# Patient Record
Sex: Male | Born: 1970 | Race: Black or African American | Hispanic: No | Marital: Married | Smoking: Never smoker
Health system: Southern US, Community
[De-identification: ages and names within clinical notes are randomized; demographics above are authoritative.]

## PROBLEM LIST (undated history)

## (undated) DIAGNOSIS — E119 Type 2 diabetes mellitus without complications: Secondary | ICD-10-CM

## (undated) DIAGNOSIS — IMO0002 Reserved for concepts with insufficient information to code with codable children: Secondary | ICD-10-CM

## (undated) DIAGNOSIS — I219 Acute myocardial infarction, unspecified: Secondary | ICD-10-CM

## (undated) DIAGNOSIS — I10 Essential (primary) hypertension: Secondary | ICD-10-CM

## (undated) DIAGNOSIS — J309 Allergic rhinitis, unspecified: Secondary | ICD-10-CM

## (undated) HISTORY — DX: Reserved for concepts with insufficient information to code with codable children: IMO0002

## (undated) HISTORY — PX: NO PAST SURGERIES: SHX2092

## (undated) HISTORY — DX: Allergic rhinitis, unspecified: J30.9

## (undated) HISTORY — PX: CORONARY ANGIOPLASTY: SHX604

---

## 2006-01-17 ENCOUNTER — Emergency Department: Payer: Self-pay | Admitting: Emergency Medicine

## 2006-01-26 ENCOUNTER — Emergency Department: Payer: Self-pay | Admitting: Emergency Medicine

## 2013-04-19 ENCOUNTER — Other Ambulatory Visit (INDEPENDENT_AMBULATORY_CARE_PROVIDER_SITE_OTHER): Payer: Self-pay | Admitting: Otolaryngology

## 2013-04-19 DIAGNOSIS — J329 Chronic sinusitis, unspecified: Secondary | ICD-10-CM

## 2013-04-23 ENCOUNTER — Other Ambulatory Visit: Payer: Self-pay

## 2014-12-04 ENCOUNTER — Other Ambulatory Visit (INDEPENDENT_AMBULATORY_CARE_PROVIDER_SITE_OTHER): Payer: Self-pay | Admitting: Otolaryngology

## 2014-12-04 DIAGNOSIS — J328 Other chronic sinusitis: Secondary | ICD-10-CM

## 2014-12-08 ENCOUNTER — Ambulatory Visit
Admission: RE | Admit: 2014-12-08 | Discharge: 2014-12-08 | Disposition: A | Discharge status: Home / Self Care | Payer: BC Managed Care – PPO | Source: Ambulatory Visit | Attending: Otolaryngology | Admitting: Otolaryngology

## 2014-12-08 ENCOUNTER — Other Ambulatory Visit: Payer: Self-pay

## 2014-12-08 DIAGNOSIS — J328 Other chronic sinusitis: Secondary | ICD-10-CM

## 2014-12-08 IMAGING — CT CT MAXILLOFACIAL W/O CM
2 series · 15 of 30 positions shown, 19 images · non-contrast
Comparison: None.

CLINICAL DATA: Chronic sinusitis and history of allergies. Fusion
protocol.

EXAM:
CT MAXILLOFACIAL WITHOUT CONTRAST
TECHNIQUE: Multidetector CT imaging of the maxillofacial structures was
performed. Multiplanar CT image reconstructions were also generated.
A small metallic BB was placed on the right temple in order to
reliably differentiate right from left.

[Series 3: axial soft 1.25 · axial · 0.49mm/px · z∈[-24,-9]mm · 2 of 88 slices shown]
[im 7/88  brain]
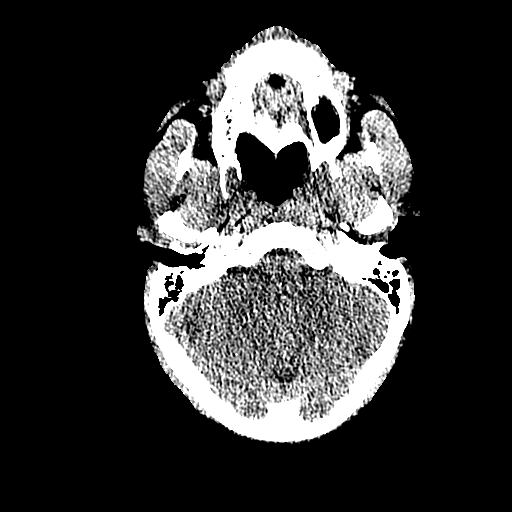
[im 19/88  brain]
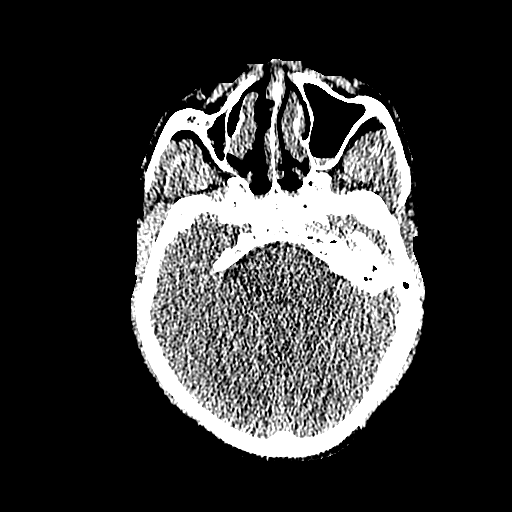

[Series 602: sagittal facial · sagittal · 0.49mm/px · 13 of 89 slices shown, 17 images]
[im 7/89  brain]
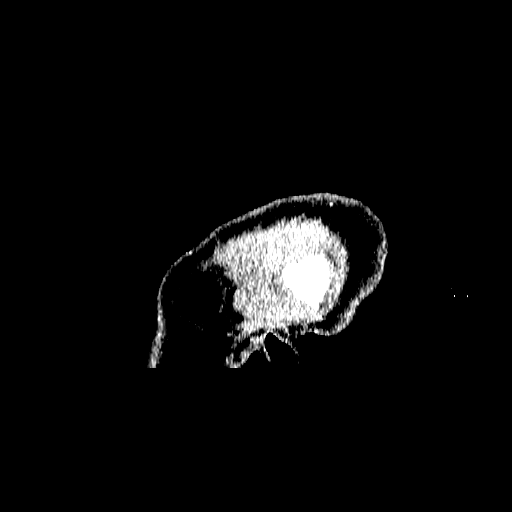
[im 7/89  bone]
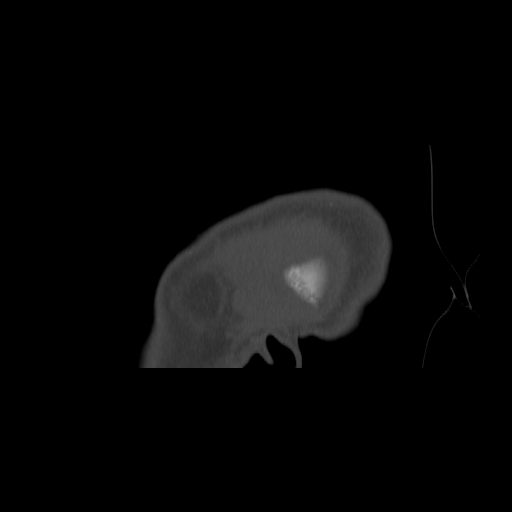
[im 13/89  bone]
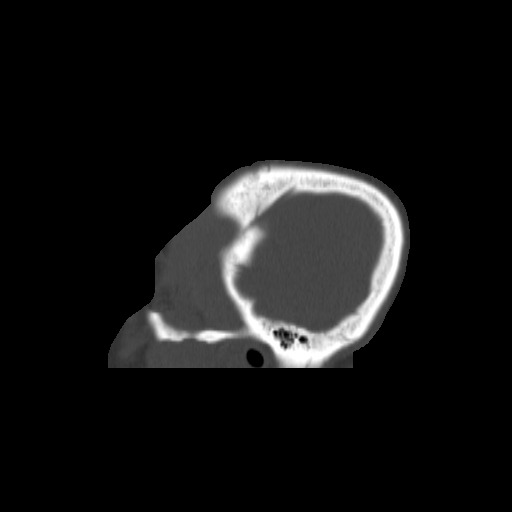
[im 19/89  bone]
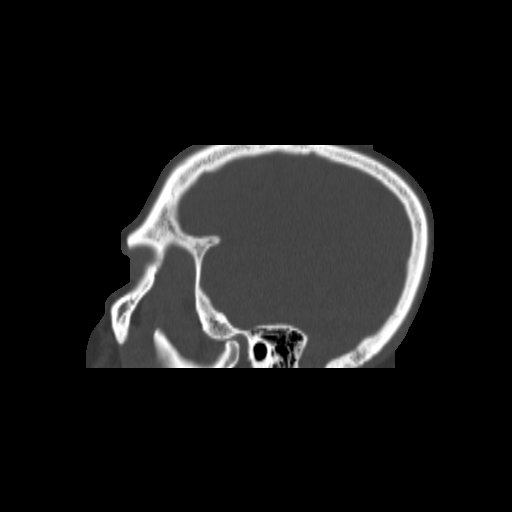
[im 26/89  bone]
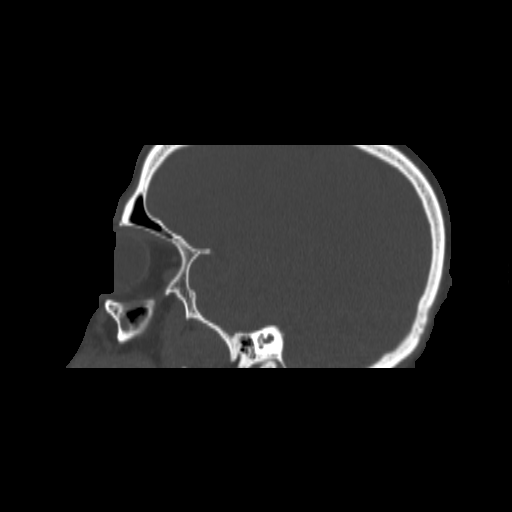
[im 32/89  brain]
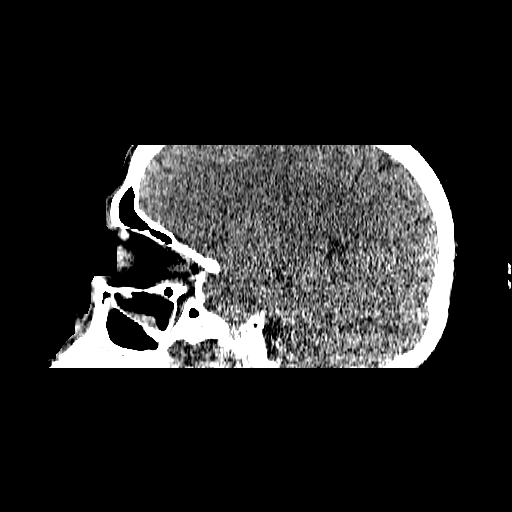
[im 32/89  bone]
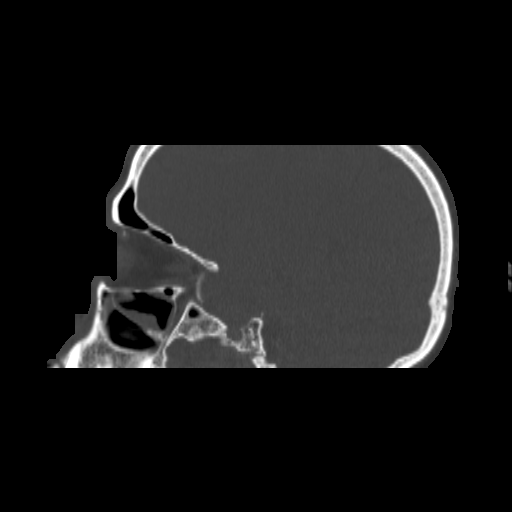
[im 38/89  bone]
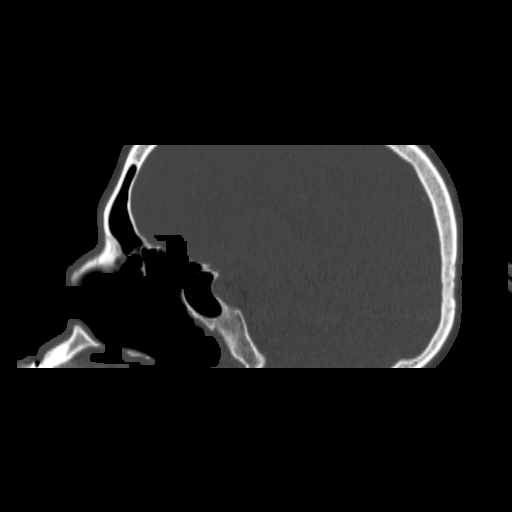
[im 45/89  bone]
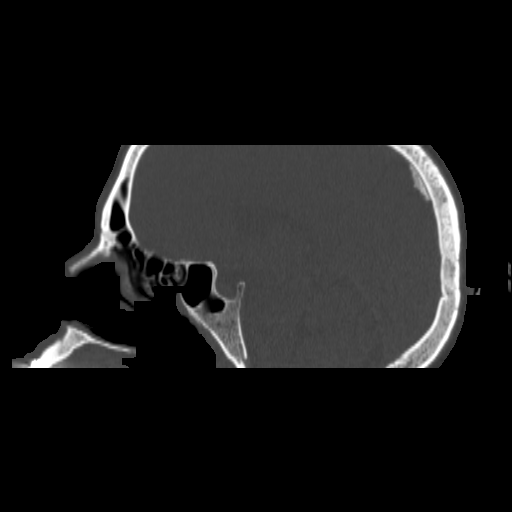
[im 51/89  bone]
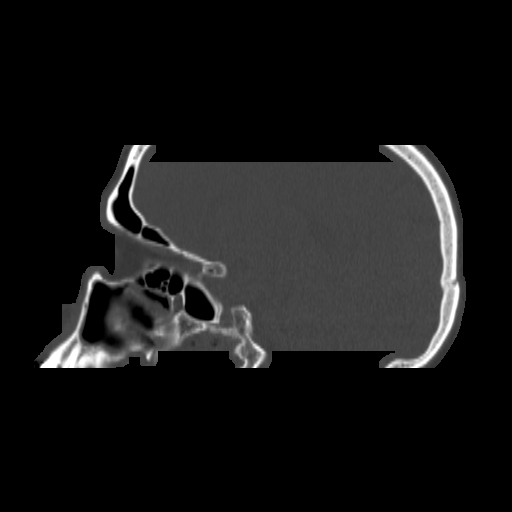
[im 57/89  brain]
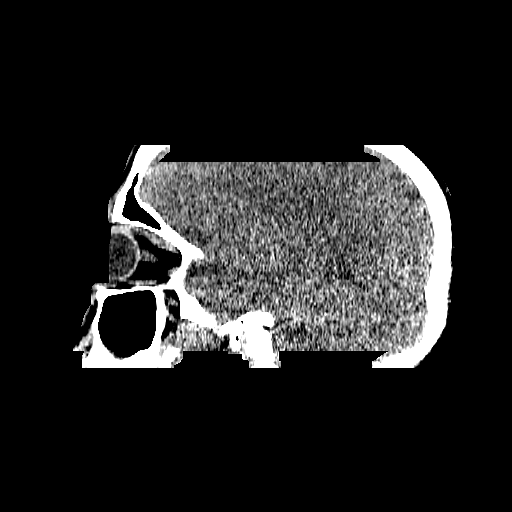
[im 57/89  bone]
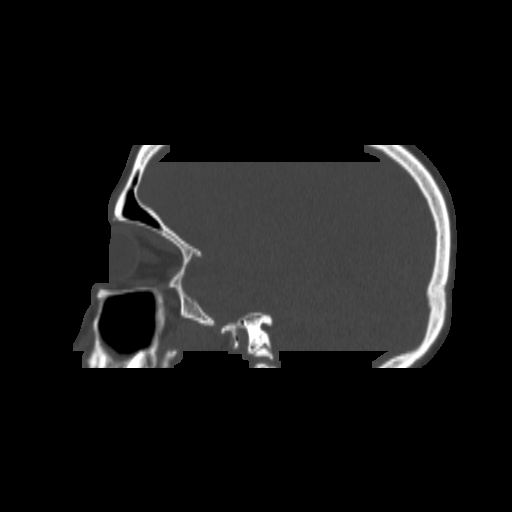
[im 63/89  bone]
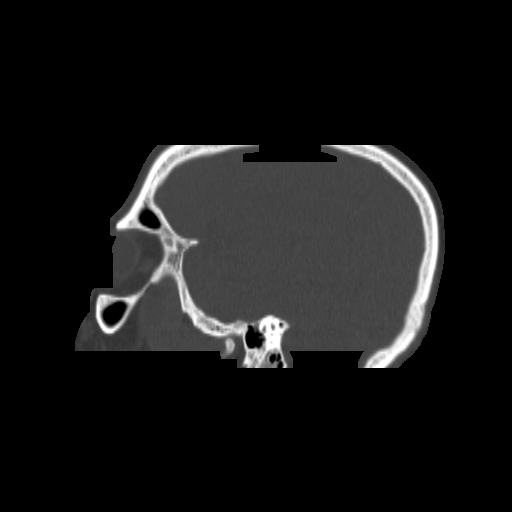
[im 70/89  bone]
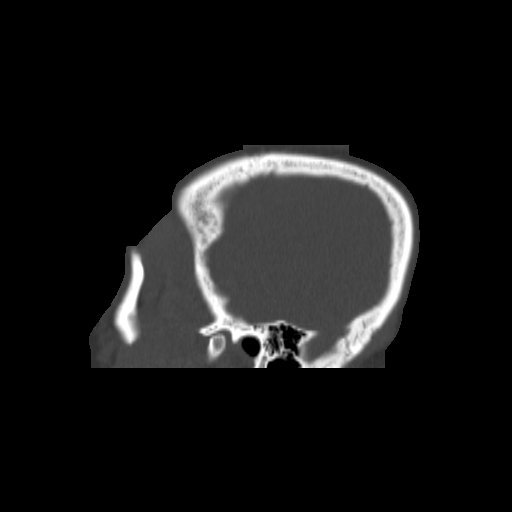
[im 76/89  bone]
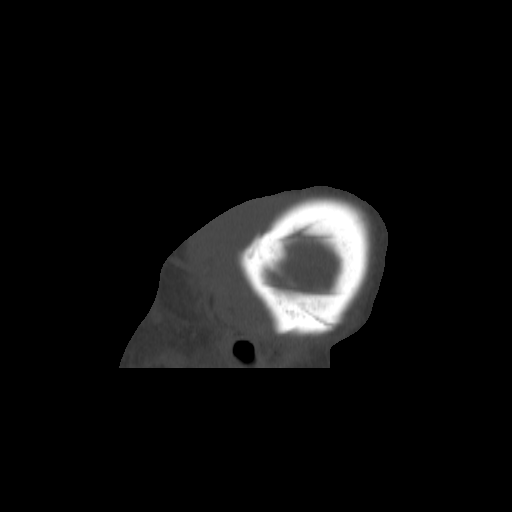
[im 82/89  brain]
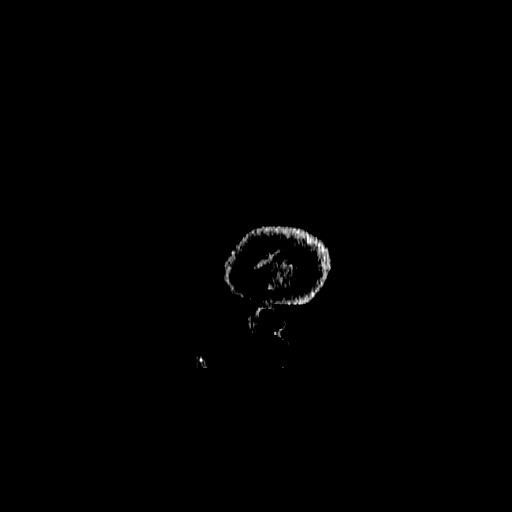
[im 82/89  bone]
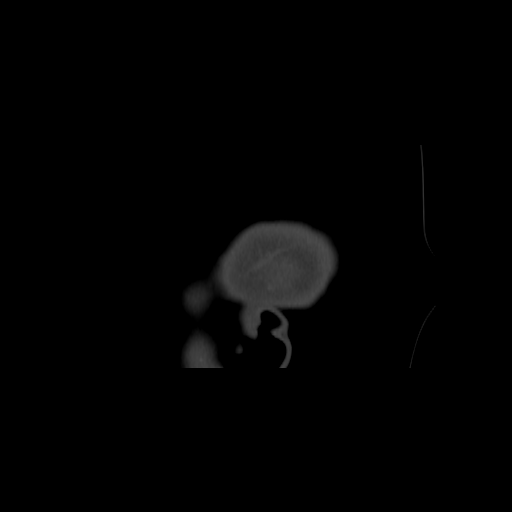

[15 of 30 positions shown; findings below may reference images not displayed]

FINDINGS: Small amount of layering fluid LEFT maxillary sinus. Severely
atelectatic RIGHT maxillary sinus with mucosal thickening,
sclerosis, and collapse inward of the medial wall. There may be
slight RIGHT enophthalmos and downward bowing of the orbital floor.

Minor mucosal thickening in the anterior ethmoid air cells without
air-fluid level. No frontal sinus fluid or sphenoid sinus fluid.

Infundibular narrowing on the LEFT related to mucosal thickening. No
similar infundibular narrowing on the RIGHT.

Boggy   inferior turbinates bilaterally.

Minor nasal septal deviation LEFT-to-RIGHT of 1-2 mm. Negative
intracranial compartment and mastoids. No nasopharyngeal or
extracranial soft tissue abnormality.
IMPRESSION: Severely atelectatic RIGHT maxillary sinus consistent with silent
sinus syndrome.

Layering fluid LEFT maxillary sinus.

Minor nasal septal deviation LEFT-to-RIGHT of 1-2 mm. Infundibular
narrowing on the LEFT related to mucosal thickening.

## 2016-03-14 ENCOUNTER — Encounter: Payer: Self-pay | Admitting: Urology

## 2016-03-14 ENCOUNTER — Ambulatory Visit (INDEPENDENT_AMBULATORY_CARE_PROVIDER_SITE_OTHER): Payer: BC Managed Care – PPO | Admitting: Urology

## 2016-03-14 VITALS — BP 114/74 | HR 83 | Ht 66.0 in | Wt 173.9 lb

## 2016-03-14 DIAGNOSIS — R350 Frequency of micturition: Secondary | ICD-10-CM

## 2016-03-14 DIAGNOSIS — N411 Chronic prostatitis: Secondary | ICD-10-CM

## 2016-03-14 DIAGNOSIS — J309 Allergic rhinitis, unspecified: Secondary | ICD-10-CM

## 2016-03-14 DIAGNOSIS — IMO0002 Reserved for concepts with insufficient information to code with codable children: Secondary | ICD-10-CM

## 2016-03-14 HISTORY — DX: Allergic rhinitis, unspecified: J30.9

## 2016-03-14 HISTORY — DX: Reserved for concepts with insufficient information to code with codable children: IMO0002

## 2016-03-14 LAB — URINALYSIS, COMPLETE
Bilirubin, UA: NEGATIVE
Glucose, UA: NEGATIVE
Ketones, UA: NEGATIVE
LEUKOCYTES UA: NEGATIVE
Nitrite, UA: NEGATIVE
PH UA: 7.5 (ref 5.0–7.5)
PROTEIN UA: NEGATIVE
RBC, UA: NEGATIVE
Specific Gravity, UA: 1.015 (ref 1.005–1.030)
Urobilinogen, Ur: 0.2 mg/dL (ref 0.2–1.0)

## 2016-03-14 LAB — BLADDER SCAN AMB NON-IMAGING: SCAN RESULT: 58

## 2016-03-14 LAB — MICROSCOPIC EXAMINATION
Bacteria, UA: NONE SEEN
EPITHELIAL CELLS (NON RENAL): NONE SEEN /HPF (ref 0–10)
WBC, UA: NONE SEEN /hpf (ref 0–?)

## 2016-03-14 MED ORDER — MELOXICAM 15 MG PO TABS: 15.0000 mg | ORAL_TABLET | Freq: Every day | ORAL | Status: DC

## 2016-03-14 MED ORDER — CIPROFLOXACIN HCL 500 MG PO TABS: 500.0000 mg | ORAL_TABLET | Freq: Two times a day (BID) | ORAL | Status: DC

## 2016-03-14 NOTE — Progress Notes (Signed)
03/14/2016 11:35 AM   Brent Oneill 08/30/1971 [id number]  Referring provider: No referring provider defined for this encounter.  Chief Complaint  Patient presents with  . New Patient (Initial Visit)    HPI: For a few months the patient has discomfort or burning after ejaculation with associated frequency. Otherwise he voids every 23 hours gets up once or twice a night. His flow is reasonable. I believe his flow can slow down after ejaculation as well.  He denies a history of previous GU surgery kidney stones urinary tract infections hematuria and blood with ejaculation  Bowel movements normal. No neurologic issues  Modifying factors: There are no other modifying factors  Associated signs and symptoms: There are no other associated signs and symptoms Aggravating and relieving factors: There are no other aggravating or relieving factors Severity: Moderate Duration: Persistent     PMH: Past Medical History  Diagnosis Date  . Asthma, persistent 03/14/2016  . Allergic rhinitis 03/14/2016    Surgical History: Past Surgical History  Procedure Laterality Date  . No past surgeries      Home Medications:    Medication List       This list is accurate as of: 03/14/16 11:35 AM.  Always use your most recent med list.               BREO ELLIPTA 200-25 MCG/INH Aepb  Generic drug:  fluticasone furoate-vilanterol  INHALE 1 PUFF ONCE A DAY. STOP SYMBICORT!     chlorthalidone 25 MG tablet  Commonly known as:  HYGROTON  USE AS DIRECTED TAKE HALF TAB DAILY     levocetirizine 5 MG tablet  Commonly known as:  XYZAL  Take 5 mg by mouth daily.     montelukast 10 MG tablet  Commonly known as:  SINGULAIR  Take 10 mg by mouth every morning.     PROAIR HFA 108 (90 Base) MCG/ACT inhaler  Generic drug:  albuterol  INHALE 1-2 PUFFS AS NEEDED EVERY 4-6 HOURS FOR COUGH OR WHEEZE. MUST LAST 30 DAYS PER MD        Allergies:  Allergies  Allergen Reactions  . Quinine Derivatives  Itching    Family History: Family History  Problem Relation Age of Onset  . Kidney cancer Neg Hx   . Prostate cancer Neg Hx     Social History:  reports that he has never smoked. He does not have any smokeless tobacco history on file. He reports that he drinks alcohol. He reports that he does not use illicit drugs.  ROS: UROLOGY Frequent Urination?: Yes Hard to postpone urination?: Yes Burning/pain with urination?: No Get up at night to urinate?: No Leakage of urine?: No Urine stream starts and stops?: Yes Trouble starting stream?: No Do you have to strain to urinate?: No Blood in urine?: No Urinary tract infection?: No Sexually transmitted disease?: No Injury to kidneys or bladder?: No Painful intercourse?: No Weak stream?: Yes Erection problems?: Yes Penile pain?: No  Gastrointestinal Nausea?: No Vomiting?: No Indigestion/heartburn?: No Diarrhea?: No Constipation?: No  Constitutional Fever: No Night sweats?: No Weight loss?: No Fatigue?: No  Skin Skin rash/lesions?: No Itching?: No  Eyes Blurred vision?: No Double vision?: No  Ears/Nose/Throat Sore throat?: No Sinus problems?: No  Hematologic/Lymphatic Swollen glands?: No Easy bruising?: No  Cardiovascular Leg swelling?: No Chest pain?: No  Respiratory Cough?: No Shortness of breath?: Yes  Endocrine Excessive thirst?: No  Musculoskeletal Back pain?: No Joint pain?: No  Neurological Headaches?: No Dizziness?: No  Psychologic  Depression?: No Anxiety?: No  Physical Exam: BP 114/74 mmHg  Pulse 83  Ht  (1.676 m)  Wt 173 lb 14.4 oz (78.881 kg)  BMI 28.08 kg/m2  Constitutional:  Alert and oriented, No acute distress. HEENT: [place] AT, moist mucus membranes.  Trachea midline, no masses. Cardiovascular: No clubbing, cyanosis, or edema. Respiratory: Normal respiratory effort, no increased work of breathing. GI: Abdomen is soft, nontender, nondistended, no abdominal masses GU: No  CVA tenderness. 40 g benign prostate Skin: No rashes, bruises or suspicious lesions. Lymph: No cervical or inguinal adenopathy. Neurologic: Grossly intact, no focal deficits, moving all 4 extremities. Psychiatric: Normal mood and affect.  Laboratory Data:  Urinalysis negative  Urinalysis No results found for: COLORURINE, APPEARANCEUR, LABSPEC, PHURINE, GLUCOSEU, HGBUR, BILIRUBINUR, KETONESUR, PROTEINUR, UROBILINOGEN, NITRITE, LEUKOCYTESUR  Pertinent Imaging: None  Assessment & Plan:  The patient has irritative voiding symptoms and perhaps flow symptoms and some discomfort especially after ejaculation. My working diagnosis is prostatitis. Pathophysiology discussed. Combination therapy discussed.  Mobile 15 mg 30 tablets and 1 refill sent. Ciprofloxacin 500 mg twice a day for 30 days sent. See in 6 weeks. Reassurance given. I believe he has mild associated erecti;e dysfunction that should normalize.  1. Urinary frequency 2. Prostatitis - Urinalysis, Complete - Bladder Scan (Post Void Residual) in office   Martina Sinner, MD  [facility] [address], [address] [place], [place] [address] [phone]

## 2016-04-27 ENCOUNTER — Ambulatory Visit (INDEPENDENT_AMBULATORY_CARE_PROVIDER_SITE_OTHER): Payer: BC Managed Care – PPO | Admitting: Urology

## 2016-04-27 ENCOUNTER — Encounter: Payer: Self-pay | Admitting: Urology

## 2016-04-27 VITALS — BP 130/89 | HR 84 | Ht 66.0 in | Wt 173.9 lb

## 2016-04-27 DIAGNOSIS — N411 Chronic prostatitis: Secondary | ICD-10-CM

## 2016-04-27 DIAGNOSIS — N410 Acute prostatitis: Secondary | ICD-10-CM | POA: Diagnosis not present

## 2016-04-27 DIAGNOSIS — R350 Frequency of micturition: Secondary | ICD-10-CM

## 2016-04-27 LAB — URINALYSIS, COMPLETE
Bilirubin, UA: NEGATIVE
Glucose, UA: NEGATIVE
KETONES UA: NEGATIVE
Leukocytes, UA: NEGATIVE
NITRITE UA: NEGATIVE
PH UA: 5.5 (ref 5.0–7.5)
Protein, UA: NEGATIVE
RBC, UA: NEGATIVE
Specific Gravity, UA: 1.015 (ref 1.005–1.030)
Urobilinogen, Ur: 1 mg/dL (ref 0.2–1.0)

## 2016-04-27 LAB — MICROSCOPIC EXAMINATION
BACTERIA UA: NONE SEEN
WBC, UA: NONE SEEN /hpf (ref 0–?)

## 2016-04-27 MED ORDER — MELOXICAM 15 MG PO TABS: 15.0000 mg | ORAL_TABLET | Freq: Every day | ORAL | 0 refills | Status: DC

## 2016-04-27 NOTE — Addendum Note (Signed)
Addended by: Lonna Cobb on: 04/27/2016 10:12 AM   Modules accepted: Orders

## 2016-04-27 NOTE — Progress Notes (Signed)
04/27/2016 8:46 AM   Brent Oneill [birth year] [id number]  Referring provider: No referring provider defined for this encounter.  Chief Complaint  Patient presents with  . Follow-up    urinary frequency, prostatitis     HPI: For a few months the patient has discomfort or burning after ejaculation with associated frequency. Otherwise he voids every 23 hours gets up once or twice a night. His flow is reasonable. I believe his flow can slow down after ejaculation as well.  Assessment & Plan:  The patient has irritative voiding symptoms and perhaps flow symptoms and some discomfort especially after ejaculation. My working diagnosis is prostatitis. Pathophysiology discussed. Combination therapy discussed.  Mobile 15 mg 30 tablets and 1 refill sent. Ciprofloxacin 500 mg twice a day for 30 days sent. See in 6 weeks. Reassurance given. I believe he has mild associated erecti;e dysfunction that should normalize.  The patient is improved but still has a little bit discomfort after ejaculation. For stable  Clinically no infections   PMH: Past Medical History:  Diagnosis Date  . Allergic rhinitis 03/14/2016  . Asthma, persistent 03/14/2016    Surgical History: Past Surgical History:  Procedure Laterality Date  . NO PAST SURGERIES      Home Medications:    Medication List       Accurate as of 04/27/16  8:46 AM. Always use your most recent med list.          BREO ELLIPTA 200-25 MCG/INH Aepb Generic drug:  fluticasone furoate-vilanterol INHALE 1 PUFF ONCE A DAY. STOP SYMBICORT!   chlorthalidone 25 MG tablet Commonly known as:  HYGROTON USE AS DIRECTED TAKE HALF TAB DAILY   levocetirizine 5 MG tablet Commonly known as:  XYZAL Take 5 mg by mouth daily.   meloxicam 15 MG tablet Commonly known as:  MOBIC Take 1 tablet (15 mg total) by mouth daily.   montelukast 10 MG tablet Commonly known as:  SINGULAIR Take 10 mg by mouth every morning.   PROAIR HFA 108 (90 Base)  MCG/ACT inhaler Generic drug:  albuterol INHALE 1-2 PUFFS AS NEEDED EVERY 4-6 HOURS FOR COUGH OR WHEEZE. MUST LAST 30 DAYS PER MD       Allergies:  Allergies  Allergen Reactions  . Quinine Derivatives Itching    Family History: Family History  Problem Relation Age of Onset  . Kidney cancer Neg Hx   . Prostate cancer Neg Hx     Social History:  reports that he has never smoked. He has never used smokeless tobacco. He reports that he drinks alcohol. He reports that he does not use drugs.  ROS:                                        Physical Exam: BP 130/89 (BP Location: Left Arm)   Pulse 84   Ht  (1.676 m)   Wt 173 lb 14.4 oz (78.9 kg)   BMI 28.07 kg/m     Laboratory Data: No results found for: WBC, HGB, HCT, MCV, PLT  No results found for: CREATININE  No results found for: PSA  No results found for: TESTOSTERONE  No results found for: HGBA1C  Urinalysis    Component Value Date/Time   APPEARANCEUR Clear 03/14/2016 1111   GLUCOSEU Negative 03/14/2016 1111   BILIRUBINUR Negative 03/14/2016 1111   PROTEINUR Negative 03/14/2016 1111   NITRITE Negative 03/14/2016 1111  LEUKOCYTESUR Negative 03/14/2016 1111    Pertinent Imaging: none  Assessment & Plan:  Resolving prostatitis; Mobile 15 mg a day for 30 days with no refills; see patient when necessary  1. Acute prostatitis  - Urinalysis, Complete  2. Urinary frequency  - Urinalysis, Complete   No Follow-up on file.  Martina Sinner, MD  [facility] [address] [place], [place] [address] [phone]

## 2016-08-02 ENCOUNTER — Telehealth: Payer: Self-pay | Admitting: Urology

## 2016-08-02 NOTE — Telephone Encounter (Signed)
Patient called the office this morning requesting a refill on Meloxicam 15 mg sent to his pharmacy ([facility] [place], [place]).

## 2016-08-09 NOTE — Telephone Encounter (Signed)
Pt called back in reference to refills of mobic. Made pt aware per Dr. Sherron MondayMacDiarmid dictation no refills were given. Pt then requested to make an appt. Pt was transferred to the front, but hung up before front desk could pick up. Made front desk aware of situation.

## 2016-10-10 ENCOUNTER — Encounter: Payer: Self-pay | Admitting: Urology

## 2016-10-10 ENCOUNTER — Ambulatory Visit (INDEPENDENT_AMBULATORY_CARE_PROVIDER_SITE_OTHER): Payer: BC Managed Care – PPO | Admitting: Urology

## 2016-10-10 VITALS — BP 117/80 | HR 84 | Ht 66.0 in | Wt 170.0 lb

## 2016-10-10 DIAGNOSIS — N529 Male erectile dysfunction, unspecified: Secondary | ICD-10-CM | POA: Diagnosis not present

## 2016-10-10 MED ORDER — SILDENAFIL CITRATE 100 MG PO TABS
100.0000 mg | ORAL_TABLET | ORAL | 5 refills | Status: DC | PRN
Start: 2016-10-10 — End: 2020-07-31

## 2016-10-10 NOTE — Progress Notes (Signed)
10/10/2016 4:36 PM   Brent Oneill 31-Mar-1971 [id number]  Referring provider: Sherron Monday, MD [address] [place], [place] [address]  Chief Complaint  Patient presents with  . Erectile Dysfunction    HPI: August 2017 For Oneill few months the patient has discomfort or burning after ejaculation with associated frequency. Otherwise he voids every 23 hours gets up once or twice One[place] night. His flow is reasonable. I believe his flow can slow down after ejaculation as well.  Assessment &Plan: The patient has irritative voiding symptoms and perhaps flow symptoms and some discomfort especially after ejaculation. My working diagnosis is prostatitis. Pathophysiology discussed. Combination therapy discussed.  Mobile 15 mg 30 tablets and 1 refill sent. Ciprofloxacin 500 mg twice One[place] day for 30 days sent. See in 6 weeks. Reassurance given. I believe he has mild associated erecti;e dysfunction that should normalize.    Today    PMH: Past Medical History:  Diagnosis Date  . Allergic rhinitis 03/14/2016  . Asthma, persistent 03/14/2016    Surgical History: Past Surgical History:  Procedure Laterality Date  . NO PAST SURGERIES      Home Medications:  Allergies as of 10/10/2016      Reactions   Quinine Derivatives Itching      Medication List       Accurate as of 10/10/16  4:36 PM. Always use your most recent med list.          BREO ELLIPTA 200-25 MCG/INH Aepb Generic drug:  fluticasone furoate-vilanterol INHALE 1 PUFF ONCE [place] DAY. STOP SYMBICORT!   chlorthalidone 25 MG tablet Commonly known as:  HYGROTON USE AS DIRECTED TAKE HALF TAB DAILY   levocetirizine 5 MG tablet Commonly known as:  XYZAL Take 5 mg by mouth daily.   montelukast 10 MG tablet Commonly known as:  SINGULAIR Take 10 mg by mouth every morning.   PROAIR HFA 108 (90 Base) MCG/ACT inhaler Generic drug:  albuterol INHALE 1-2 PUFFS AS NEEDED EVERY 4-6 HOURS FOR COUGH OR WHEEZE. MUST LAST 30 DAYS PER MD       Allergies:  Allergies  Allergen Reactions  . Quinine Derivatives Itching    Family History: Family History  Problem Relation Age of Onset  . Kidney cancer Neg Hx   . Prostate cancer Neg Hx     Social History:  reports that he has never smoked. He has never used smokeless tobacco. He reports that he drinks alcohol. He reports that he does not use drugs.  ROS: UROLOGY Frequent Urination?: No Hard to postpone urination?: Yes Burning/pain with urination?: No Get up at night to urinate?: No Leakage of urine?: No Urine stream starts and stops?: No Trouble starting stream?: No Do you have to strain to urinate?: No Blood in urine?: No Urinary tract infection?: No Sexually transmitted disease?: No Injury to kidneys or bladder?: No Painful intercourse?: No Weak stream?: Yes Erection problems?: Yes Penile pain?: Yes  Gastrointestinal Nausea?: No Vomiting?: No Indigestion/heartburn?: No Diarrhea?: No Constipation?: No  Constitutional Fever: No Night sweats?: No Weight loss?: No Fatigue?: No  Skin Skin rash/lesions?: No Itching?: No  Eyes Blurred vision?: No Double vision?: No  Ears/Nose/Throat Sore throat?: No Sinus problems?: No  Hematologic/Lymphatic Swollen glands?: No Easy bruising?: No  Cardiovascular Leg swelling?: No Chest pain?: No  Respiratory Cough?: No Shortness of breath?: No  Endocrine Excessive thirst?: No  Musculoskeletal Back pain?: No Joint pain?: No  Neurological Headaches?: No Dizziness?: No  Psychologic Depression?: No Anxiety?: No  Physical Exam:  BP 117/80   Pulse 84   Ht 5\' 6"  (1.676 m)   Wt 170 lb (77.1 kg)   BMI 27.44 kg/m     Laboratory Data: No results found for: WBC, HGB, HCT, MCV, PLT  No results found for: CREATININE  No results found for: PSA  No results found for: TESTOSTERONE  No results found for: HGBA1C  Urinalysis    Component Value Date/Time   APPEARANCEUR Clear 04/27/2016  0846   GLUCOSEU Negative 04/27/2016 0846   BILIRUBINUR Negative 04/27/2016 0846   PROTEINUR Negative 04/27/2016 0846   NITRITE Negative 04/27/2016 0846   LEUKOCYTESUR Negative 04/27/2016 0846    Pertinent Imaging: none  Assessment & Plan:  The patient said his prostatitis symptoms settled down dramatically and his erections may have improved somewhat to treat his prostatitis. He now notices One[place] little bit of slowing of his stream for One[place] few days after he has intercourse but otherwise does not have voiding complaints and tolerates it well  He still having Oneill hard time holding his erections and he is ejaculating early  We talked oh Viagra and I gave him Oneill 4 tablet prescription and Oneill co-pay card with refills. He can call for the 100 mg tablet if he wants Oneill larger prescription. We did not have samples. I will see him when necessary  There are no diagnoses linked to this encounter.  No Follow-up on file.  Martina SinnerMACDIARMID,Brent Conard A, MD  [facility] [address], [address] [place], [place] [address] [phone]

## 2017-03-30 ENCOUNTER — Telehealth: Payer: Self-pay | Admitting: Radiology

## 2017-03-30 NOTE — Telephone Encounter (Signed)
Pt requests refill of meloxicam. Pt states Dr Sherron MondayMacDiarmid prescribed it previously. Pt may be reached at [phone].

## 2017-03-31 NOTE — Telephone Encounter (Signed)
LMOM recommending pt call PCP for prescription as Dr Sherron MondayMacDiarmid only prescribed medication once with one refill & it was over 6 months ago.

## 2019-12-12 ENCOUNTER — Ambulatory Visit: Payer: BC Managed Care – PPO | Attending: Internal Medicine

## 2019-12-12 DIAGNOSIS — Z23 Encounter for immunization: Secondary | ICD-10-CM

## 2019-12-12 NOTE — Progress Notes (Signed)
   Covid-19 Vaccination Clinic  Name:  Brent Oneill    MRN: [id number] DOB: [birth year]  12/12/2019  [name] was observed post Covid-19 immunization for 15 minutes without incident. He was provided with Vaccine Information Sheet and instruction to access the V-Safe system.   [name] was instructed to call 911 with any severe reactions post vaccine: Marland Kitchen Difficulty breathing  . Swelling of face and throat  . A fast heartbeat  . A bad rash all over body  . Dizziness and weakness   Immunizations Administered    Name Date Dose VIS Date Route   Pfizer COVID-19 Vaccine 12/12/2019  2:46 PM 0.3 mL 09/06/2019 Intramuscular   Manufacturer: [employer], [employer]   Lot: [id number]   NDC: [address]-9

## 2020-01-06 ENCOUNTER — Ambulatory Visit: Payer: BC Managed Care – PPO | Attending: Internal Medicine

## 2020-01-06 DIAGNOSIS — Z23 Encounter for immunization: Secondary | ICD-10-CM

## 2020-01-06 NOTE — Progress Notes (Signed)
   Covid-19 Vaccination Clinic  Name:  Brent Oneill    MRN: [id number] DOB: 10/20/1970  01/06/2020  [name] was observed post Covid-19 immunization for 15 minutes without incident. He was provided with Vaccine Information Sheet and instruction to access the V-Safe system.   [name] was instructed to call 911 with any severe reactions post vaccine: Marland Kitchen Difficulty breathing  . Swelling of face and throat  . A fast heartbeat  . A bad rash all over body  . Dizziness and weakness   Immunizations Administered    Name Date Dose VIS Date Route   Pfizer COVID-19 Vaccine 01/06/2020  3:14 PM 0.3 mL 09/06/2019 Intramuscular   Manufacturer: [employer], [employer]   Lot: [id number]   NDC: [address]-0

## 2020-08-03 ENCOUNTER — Encounter: Payer: Self-pay | Admitting: Urology

## 2020-08-03 ENCOUNTER — Other Ambulatory Visit: Payer: Self-pay

## 2020-08-03 ENCOUNTER — Ambulatory Visit (INDEPENDENT_AMBULATORY_CARE_PROVIDER_SITE_OTHER): Payer: BC Managed Care – PPO | Admitting: Urology

## 2020-08-03 VITALS — BP 147/90 | HR 91 | Ht 66.0 in | Wt 172.0 lb

## 2020-08-03 DIAGNOSIS — N401 Enlarged prostate with lower urinary tract symptoms: Secondary | ICD-10-CM | POA: Diagnosis not present

## 2020-08-03 DIAGNOSIS — R972 Elevated prostate specific antigen [PSA]: Secondary | ICD-10-CM | POA: Diagnosis not present

## 2020-08-03 DIAGNOSIS — N138 Other obstructive and reflux uropathy: Secondary | ICD-10-CM | POA: Diagnosis not present

## 2020-08-03 NOTE — Patient Instructions (Addendum)
Prostate Cancer Screening  Prostate cancer screening is a test that is done to check for the presence of prostate cancer in men. The prostate gland is a walnut-sized gland that is located below the bladder and in front of the rectum in males. The function of the prostate is to add fluid to semen during ejaculation. Prostate cancer is the second most common type of cancer in men. Who should have prostate cancer screening?  Screening recommendations vary based on age and other risk factors. Screening is recommended if:  You are older than age 55. If you are age 55-69, talk with your health care provider about your need for screening and how often screening should be done. Because most prostate cancers are slow growing and will not cause death, screening is generally reserved in this age group for men who have a 10-15-year life expectancy.  You are younger than age 55, and you have these risk factors: ? Being a black male or a male of African descent. ? Having a father, brother, or uncle who has been diagnosed with prostate cancer. The risk is higher if your family member's cancer occurred at an early age. Screening is not recommended if:  You are younger than age 40.  You are between the ages of 40 and 54 and you have no risk factors.  You are 70 years of age or older. At this age, the risks that screening can cause are greater than the benefits that it may provide. If you are at high risk for prostate cancer, your health care provider may recommend that you have screenings more often or that you start screening at a younger age. How is screening for prostate cancer done? The recommended prostate cancer screening test is a blood test called the prostate-specific antigen (PSA) test. PSA is a protein that is made in the prostate. As you age, your prostate naturally produces more PSA. Abnormally high PSA levels may be caused by:  Prostate cancer.  An enlarged prostate that is not caused by cancer  (benign prostatic hyperplasia, BPH). This condition is very common in older men.  A prostate gland infection (prostatitis). Depending on the PSA results, you may need more tests, such as:  A physical exam to check the size of your prostate gland.  Blood and imaging tests.  A procedure to remove tissue samples from your prostate gland for testing (biopsy). What are the benefits of prostate cancer screening?  Screening can help to identify cancer at an early stage, before symptoms start and when the cancer can be treated more easily.  There is a small chance that screening may lower your risk of dying from prostate cancer. The chance is small because prostate cancer is a slow-growing cancer, and most men with prostate cancer die from a different cause. What are the risks of prostate cancer screening? The main risk of prostate cancer screening is diagnosing and treating prostate cancer that would never have caused any symptoms or problems. This is called overdiagnosisand overtreatment. PSA screening cannot tell you if your PSA is high due to cancer or a different cause. A prostate biopsy is the only procedure to diagnose prostate cancer. Even the results of a biopsy may not tell you if your cancer needs to be treated. Slow-growing prostate cancer may not need any treatment other than monitoring, so diagnosing and treating it may cause unnecessary stress or other side effects. A prostate biopsy may also cause:  Infection or fever.  A false negative. This is   a result that shows that you do not have prostate cancer when you actually do have prostate cancer. Questions to ask your health care provider  When should I start prostate cancer screening?  What is my risk for prostate cancer?  How often do I need screening?  What type of screening tests do I need?  How do I get my test results?  What do my results mean?  Do I need treatment? Where to find more information  The American Cancer  Society: www.cancer.org  American Urological Association: www.auanet.org Contact a health care provider if:  You have difficulty urinating.  You have pain when you urinate or ejaculate.  You have blood in your urine or semen.  You have pain in your back or in the area of your prostate. Summary  Prostate cancer is a common type of cancer in men. The prostate gland is located below the bladder and in front of the rectum. This gland adds fluid to semen during ejaculation.  Prostate cancer screening may identify cancer at an early stage, when the cancer can be treated more easily.  The prostate-specific antigen (PSA) test is the recommended screening test for prostate cancer.  Discuss the risks and benefits of prostate cancer screening with your health care provider. If you are age 38 or older, the risks that screening can cause are greater than the benefits that it may provide. This information is not intended to replace advice given to you by your health care provider. Make sure you discuss any questions you have with your health care provider. Document Revised: 04/25/2019 Document Reviewed: 04/25/2019 Elsevier Patient Education  [place].   Benign Prostatic Hyperplasia  Benign prostatic hyperplasia (BPH) is an enlarged prostate gland that is caused by the normal aging process and not by cancer. The prostate is a walnut-sized gland that is involved in the production of semen. It is located in front of the rectum and below the bladder. The bladder stores urine and the urethra is the tube that carries the urine out of the body. The prostate may get bigger as a man gets older. An enlarged prostate can press on the urethra. This can make it harder to pass urine. The build-up of urine in the bladder can cause infection. Back pressure and infection may progress to bladder damage and kidney (renal) failure. What are the causes? This condition is part of a normal aging process.  However, not all men develop problems from this condition. If the prostate enlarges away from the urethra, urine flow will not be blocked. If it enlarges toward the urethra and compresses it, there will be problems passing urine. What increases the risk? This condition is more likely to develop in men over the age of 97 years. What are the signs or symptoms? Symptoms of this condition include:  Getting up often during the night to urinate.  Needing to urinate frequently during the day.  Difficulty starting urine flow.  Decrease in size and strength of your urine stream.  Leaking (dribbling) after urinating.  Inability to pass urine. This needs immediate treatment.  Inability to completely empty your bladder.  Pain when you pass urine. This is more common if there is also an infection.  Urinary tract infection (UTI). How is this diagnosed? This condition is diagnosed based on your medical history, a physical exam, and your symptoms. Tests will also be done, such as:  A post-void bladder scan. This measures any amount of urine that may remain in your  bladder after you finish urinating.  A digital rectal exam. In a rectal exam, your health care provider checks your prostate by putting a lubricated, gloved finger into your rectum to feel the back of your prostate gland. This exam detects the size of your gland and any abnormal lumps or growths.  An exam of your urine (urinalysis).  A prostate specific antigen (PSA) screening. This is a blood test used to screen for prostate cancer.  An ultrasound. This test uses sound waves to electronically produce a picture of your prostate gland. Your health care provider may refer you to a specialist in kidney and prostate diseases (urologist). How is this treated? Once symptoms begin, your health care provider will monitor your condition (active surveillance or watchful waiting). Treatment for this condition will depend on the severity of your  condition. Treatment may include:  Observation and yearly exams. This may be the only treatment needed if your condition and symptoms are mild.  Medicines to relieve your symptoms, including: ? Medicines to shrink the prostate. ? Medicines to relax the muscle of the prostate.  Surgery in severe cases. Surgery may include: ? Prostatectomy. In this procedure, the prostate tissue is removed completely through an open incision or with a laparoscope or robotics. ? Transurethral resection of the prostate (TURP). In this procedure, a tool is inserted through the opening at the tip of the penis (urethra). It is used to cut away tissue of the inner core of the prostate. The pieces are removed through the same opening of the penis. This removes the blockage. ? Transurethral incision (TUIP). In this procedure, small cuts are made in the prostate. This lessens the prostate's pressure on the urethra. ? Transurethral microwave thermotherapy (TUMT). This procedure uses microwaves to create heat. The heat destroys and removes a small amount of prostate tissue. ? Transurethral needle ablation (TUNA). This procedure uses radio frequencies to destroy and remove a small amount of prostate tissue. ? Interstitial laser coagulation (ILC). This procedure uses a laser to destroy and remove a small amount of prostate tissue. ? Transurethral electrovaporization (TUVP). This procedure uses electrodes to destroy and remove a small amount of prostate tissue. ? Prostatic urethral lift. This procedure inserts an implant to push the lobes of the prostate away from the urethra. Follow these instructions at home:  Take over-the-counter and prescription medicines only as told by your health care provider.  Monitor your symptoms for any changes. Contact your health care provider with any changes.  Avoid drinking large amounts of liquid before going to bed or out in public.  Avoid or reduce how much caffeine or alcohol you  drink.  Give yourself time when you urinate.  Keep all follow-up visits as told by your health care provider. This is important. Contact a health care provider if:  You have unexplained back pain.  Your symptoms do not get better with treatment.  You develop side effects from the medicine you are taking.  Your urine becomes very dark or has a bad smell.  Your lower abdomen becomes distended and you have trouble passing your urine. Get help right away if:  You have a fever or chills.  You suddenly cannot urinate.  You feel lightheaded, or very dizzy, or you faint.  There are large amounts of blood or clots in the urine.  Your urinary problems become hard to manage.  You develop moderate to severe low back or flank pain. The flank is the side of your body between the ribs   and the hip. These symptoms may represent a serious problem that is an emergency. Do not wait to see if the symptoms will go away. Get medical help right away. Call your local emergency services (911 in the [place] Do not drive yourself to the hospital. Summary  Benign prostatic hyperplasia (BPH) is an enlarged prostate that is caused by the normal aging process and not by cancer.  An enlarged prostate can press on the urethra. This can make it hard to pass urine.  This condition is part of a normal aging process and is more likely to develop in men over the age of 50 years.  Get help right away if you suddenly cannot urinate. This information is not intended to replace advice given to you by your health care provider. Make sure you discuss any questions you have with your health care provider. Document Revised: 08/07/2018 Document Reviewed: 10/17/2016 Elsevier Patient Education  2020 [employer] Inc.   Transrectal Ultrasound-Guided Prostate Biopsy A transrectal ultrasound-guided prostate biopsy is a procedure to remove samples of prostate tissue for testing. The procedure uses ultrasound images to guide the  process of removing the samples. The samples are taken to a lab to be checked for prostate cancer. This procedure is usually done to evaluate the prostate gland of men who have raised (elevated) levels of prostate-specific antigen (PSA), which can be a sign of prostate cancer. Tell a health care provider about:  Any allergies you have.  All medicines you are taking, including vitamins, herbs, eye drops, creams, and over-the-counter medicines.  Any problems you or family members have had with anesthetic medicines.  Any blood disorders you have.  Any surgeries you have had.  Any medical conditions you have. What are the risks? Generally, this is a safe procedure. However, problems may occur, including:  Prostate infection.  Bleeding from the rectum.  Blood in the urine.  Allergic reactions to medicines.  Damage to surrounding structures such as blood vessels, organs, or muscles.  Difficulty passing urine.  Nerve damage. This is usually temporary.  Pain. What happens before the procedure? Staying hydrated Follow instructions from your health care provider about hydration, which may include:  Up to 2 hours before the procedure - you may continue to drink clear liquids, such as water, clear fruit juice, black coffee, and plain tea.  Eating and drinking restrictions Follow instructions from your health care provider about eating and drinking, which may include:  8 hours before the procedure - stop eating heavy meals or foods such as meat, fried foods, or fatty foods.  6 hours before the procedure - stop eating light meals or foods, such as toast or cereal.  6 hours before the procedure - stop drinking milk or drinks that contain milk.  2 hours before the procedure - stop drinking clear liquids. Medicines Ask your health care provider about:  Changing or stopping your regular medicines. This is especially important if you are taking diabetes medicines or blood  thinners.  Taking over-the-counter medicines, vitamins, herbs, and supplements.  Taking medicines such as aspirin and ibuprofen. These medicines can thin your blood. Do not take these medicines unless your health care provider tells you to take them. General instructions  You may be given antibiotic medicine to help prevent infection. If so, take the antibiotic as told by your health care provider.  You will be given an enema. During an enema, a liquid is injected into your rectum to clear out waste.  You may have a blood  or urine sample taken.  Plan to have someone take you home from the hospital or clinic. What happens during the procedure?   To lower your risk of infection: ? Your health care team will wash or sanitize their hands. ? Hair may be removed from the surgical area. ? Your skin will be cleaned with a germ-killing soap. ? You may be given antibiotic medicine.  An IV will be inserted into one of your veins.  You will be given one or both of the following: ? A medicine to help you relax (sedative). ? A medicine to numb the area (local anesthetic).  You will be placed on your left side, and your knees will be bent toward your chest.  A probe with lubricated gel will be placed into your rectum, and images will be taken of your prostate and surrounding structures.  Numbing medicine will be injected into your prostate.  A biopsy needle will be inserted through your rectum and guided to your prostate using the ultrasound images.  Prostate tissue samples will be removed, and the needle will then be removed.  The biopsy samples will be sent to a lab to be tested. The procedure may vary among health care providers and hospitals. What happens after the procedure?  Your blood pressure, heart rate, breathing rate, and blood oxygen level will be monitored until the medicines you were given have worn off.  You may have some discomfort in the rectal area. You will be given  pain medicine as needed.  Do not drive for 24 hours if you received a sedative. Summary  A transrectal ultrasound-guided biopsy removes samples of tissue from your prostate.  This procedure is usually done to evaluate the prostate gland of men who have raised (elevated) levels of prostate-specific antigen (PSA), which can be a sign of prostate cancer.  After your procedure, you may feel some discomfort in the rectal area.  Plan to have someone take you home from the hospital or clinic. This information is not intended to replace advice given to you by your health care provider. Make sure you discuss any questions you have with your health care provider. Document Revised: 01/02/2019 Document Reviewed: 12/09/2016 Elsevier Patient Education  2020 [employer].

## 2020-08-03 NOTE — Progress Notes (Signed)
08/03/20 4:28 PM   Meryle Ready Silverio Lay September 12, 1971 [id number]  CC: Elevated PSA, urinary symptoms  HPI: I saw Mr. Niu for elevated PSA and urinary symptoms.  He is a 49 year old healthy African-American male with a distant history of prostatitis who recently presented to his PCP with some weak urinary stream and urinary symptoms and was given a 10-day course of Bactrim as well as Flomax.  PSA at that time was elevated at 7.7.  A PSA was repeated 3 weeks later and had dropped down to 5.9.  He was referred for further evaluation of the persistently elevated PSA.  His primary urinary symptoms or weak stream which have resolved completely after the antibiotics and addition of Flomax.  He denies any family history of prostate or breast cancer.  He reports his dad has had a long problem with BPH and urinary problems requiring catheter previously.   PMH: Past Medical History:  Diagnosis Date   Allergic rhinitis 03/14/2016   Asthma, persistent 03/14/2016    Family History: Family History  Problem Relation Age of Onset   Kidney cancer Neg Hx    Prostate cancer Neg Hx     Social History:  reports that he has never smoked. He has never used smokeless tobacco. He reports current alcohol use. He reports that he does not use drugs.  Physical Exam: BP (!) 147/90 (BP Location: Left Arm, Patient Position: Sitting, Cuff Size: Normal)    Pulse 91    Ht 5\' 6"  (1.676 m)    Wt 172 lb (78 kg)    BMI 27.76 kg/m    Constitutional:  Alert and oriented, No acute distress. Cardiovascular: No clubbing, cyanosis, or edema. Respiratory: Normal respiratory effort, no increased work of breathing. GI: Abdomen is soft, nontender, nondistended, no abdominal masses GU: No testicular masses, widely patent meatus DRE: Enlarged prostate, 80 g, smooth, no nodules or masses  Laboratory Data: Reviewed, see HPI  Pertinent Imaging: None to review  Assessment & Plan:   Healthy 49 year old male who presented to his PCP  with a possible episode of prostatitis and PSA was found to be elevated at 7.7 and he was treated with tamsulosin and Bactrim.  PSA was repeated 3 weeks later and dropped down to 5.9.  His urinary symptoms have completely resolved after the course of antibiotics and starting Flomax.  DRE today shows an enlarged prostate but otherwise benign.  We reviewed the implications of an elevated PSA and the uncertainty surrounding it. In general, a man's PSA increases with age and is produced by both normal and cancerous prostate tissue. The differential diagnosis for elevated PSA includes BPH, prostate cancer, infection, recent intercourse/ejaculation, recent urethroscopic manipulation (foley placement/cystoscopy) or trauma, and prostatitis.   Management of an elevated PSA can include observation or prostate biopsy and we discussed this in detail. Our goal is to detect clinically significant prostate cancers, and manage with either active surveillance, surgery, or radiation for localized disease. Risks of prostate biopsy include bleeding, infection (including life threatening sepsis), pain, and lower urinary symptoms. Hematuria, hematospermia, and blood in the stool are all common after biopsy and can persist up to 4 weeks.   We reviewed at length that I suspect his PSA was elevated secondary to an episode of acute prostatitis.  I recommended repeating the PSA in 2 to 3 weeks to confirm downtrend to normal range.  If PSA remains elevated, would recommend prostate biopsy at that time to rule out malignancy.  We also briefly discussed other options for  his BPH and urinary symptoms including UroLift or HOLEP, versus ongoing medical management with Flomax.  RTC 3 to 4 weeks with PSA prior, IPSS, PVR   Legrand Rams, MD 08/03/2020  [facility] [address], [address] [place], [place] [address] [phone]

## 2020-08-28 ENCOUNTER — Other Ambulatory Visit: Payer: Self-pay

## 2020-09-02 ENCOUNTER — Ambulatory Visit: Payer: Self-pay | Admitting: Urology

## 2020-10-01 ENCOUNTER — Other Ambulatory Visit: Payer: Self-pay

## 2020-10-08 ENCOUNTER — Ambulatory Visit: Payer: Self-pay | Admitting: Urology

## 2020-11-19 ENCOUNTER — Other Ambulatory Visit: Payer: Self-pay

## 2020-11-19 ENCOUNTER — Other Ambulatory Visit: Payer: BC Managed Care – PPO

## 2020-11-19 DIAGNOSIS — R972 Elevated prostate specific antigen [PSA]: Secondary | ICD-10-CM

## 2020-11-20 LAB — FPSA% REFLEX
% FREE PSA: 25 %
PSA, FREE: 1 ng/mL

## 2020-11-20 LAB — PSA TOTAL (REFLEX TO FREE): Prostate Specific Ag, Serum: 4 ng/mL (ref 0.0–4.0)

## 2020-11-25 ENCOUNTER — Ambulatory Visit: Payer: BC Managed Care – PPO | Admitting: Urology

## 2020-11-25 ENCOUNTER — Other Ambulatory Visit: Payer: Self-pay

## 2020-11-25 ENCOUNTER — Encounter: Payer: Self-pay | Admitting: Urology

## 2020-11-25 VITALS — BP 141/77 | HR 106 | Ht 66.0 in | Wt 172.0 lb

## 2020-11-25 DIAGNOSIS — R972 Elevated prostate specific antigen [PSA]: Secondary | ICD-10-CM | POA: Diagnosis not present

## 2020-11-25 DIAGNOSIS — N138 Other obstructive and reflux uropathy: Secondary | ICD-10-CM

## 2020-11-25 DIAGNOSIS — N401 Enlarged prostate with lower urinary tract symptoms: Secondary | ICD-10-CM | POA: Diagnosis not present

## 2020-11-25 DIAGNOSIS — R6882 Decreased libido: Secondary | ICD-10-CM | POA: Diagnosis not present

## 2020-11-25 LAB — BLADDER SCAN AMB NON-IMAGING: Scan Result: 0

## 2020-11-25 MED ORDER — TAMSULOSIN HCL 0.4 MG PO CAPS
0.4000 mg | ORAL_CAPSULE | Freq: Every day | ORAL | 3 refills | Status: DC
Start: 2020-11-25 — End: 2022-08-25

## 2020-11-25 NOTE — Patient Instructions (Signed)
Natural ways to increase your testosterone include lifting heavy weights and exercising, getting plenty of sleep, eating healthy  Prostate Cancer Screening  Prostate cancer screening is a test that is done to check for the presence of prostate cancer in men. The prostate gland is a walnut-sized gland that is located below the bladder and in front of the rectum in males. The function of the prostate is to add fluid to semen during ejaculation. Prostate cancer is the second most common type of cancer in men. Who should have prostate cancer screening?  Screening recommendations vary based on age and other risk factors. Screening is recommended if:  You are older than age 1. If you are age 46-69, talk with your health care provider about your need for screening and how often screening should be done. Because most prostate cancers are slow growing and will not cause death, screening is generally reserved in this age group for men who have a 10-15-year life expectancy.  You are younger than age 71, and you have these risk factors: ? Being a black male or a male of African descent. ? Having a father, brother, or uncle who has been diagnosed with prostate cancer. The risk is higher if your family member's cancer occurred at an early age. Screening is not recommended if:  You are younger than age 31.  You are between the ages of 49 and 37 and you have no risk factors.  You are 49 years of age or older. At this age, the risks that screening can cause are greater than the benefits that it may provide. If you are at high risk for prostate cancer, your health care provider may recommend that you have screenings more often or that you start screening at a younger age. How is screening for prostate cancer done? The recommended prostate cancer screening test is a blood test called the prostate-specific antigen (PSA) test. PSA is a protein that is made in the prostate. As you age, your prostate naturally  produces more PSA. Abnormally high PSA levels may be caused by:  Prostate cancer.  An enlarged prostate that is not caused by cancer (benign prostatic hyperplasia, BPH). This condition is very common in older men.  A prostate gland infection (prostatitis). Depending on the PSA results, you may need more tests, such as:  A physical exam to check the size of your prostate gland.  Blood and imaging tests.  A procedure to remove tissue samples from your prostate gland for testing (biopsy). What are the benefits of prostate cancer screening?  Screening can help to identify cancer at an early stage, before symptoms start and when the cancer can be treated more easily.  There is a small chance that screening may lower your risk of dying from prostate cancer. The chance is small because prostate cancer is a slow-growing cancer, and most men with prostate cancer die from a different cause. What are the risks of prostate cancer screening? The main risk of prostate cancer screening is diagnosing and treating prostate cancer that would never have caused any symptoms or problems. This is called overdiagnosisand overtreatment. PSA screening cannot tell you if your PSA is high due to cancer or a different cause. A prostate biopsy is the only procedure to diagnose prostate cancer. Even the results of a biopsy may not tell you if your cancer needs to be treated. Slow-growing prostate cancer may not need any treatment other than monitoring, so diagnosing and treating it may cause unnecessary stress or  other side effects. A prostate biopsy may also cause:  Infection or fever.  A false negative. This is a result that shows that you do not have prostate cancer when you actually do have prostate cancer. Questions to ask your health care provider  When should I start prostate cancer screening?  What is my risk for prostate cancer?  How often do I need screening?  What type of screening tests do I  need?  How do I get my test results?  What do my results mean?  Do I need treatment? Where to find more information  The American Cancer Society: www.cancer.org  American Urological Association: www.auanet.org Contact a health care provider if:  You have difficulty urinating.  You have pain when you urinate or ejaculate.  You have blood in your urine or semen.  You have pain in your back or in the area of your prostate. Summary  Prostate cancer is a common type of cancer in men. The prostate gland is located below the bladder and in front of the rectum. This gland adds fluid to semen during ejaculation.  Prostate cancer screening may identify cancer at an early stage, when the cancer can be treated more easily.  The prostate-specific antigen (PSA) test is the recommended screening test for prostate cancer.  Discuss the risks and benefits of prostate cancer screening with your health care provider. If you are age 49 or older, the risks that screening can cause are greater than the benefits that it may provide. This information is not intended to replace advice given to you by your health care provider. Make sure you discuss any questions you have with your health care provider. Document Revised: 01/03/2020 Document Reviewed: 04/25/2019 Elsevier Patient Education  2021 Elsevier Inc.   Benign Prostatic Hyperplasia  Benign prostatic hyperplasia (BPH) is an enlarged prostate gland that is caused by the normal aging process and not by cancer. The prostate is a walnut-sized gland that is involved in the production of semen. It is located in front of the rectum and below the bladder. The bladder stores urine and the urethra is the tube that carries the urine out of the body. The prostate may get bigger as a man gets older. An enlarged prostate can press on the urethra. This can make it harder to pass urine. The build-up of urine in the bladder can cause infection. Back pressure and  infection may progress to bladder damage and kidney (renal) failure. What are the causes? This condition is part of a normal aging process. However, not all men develop problems from this condition. If the prostate enlarges away from the urethra, urine flow will not be blocked. If it enlarges toward the urethra and compresses it, there will be problems passing urine. What increases the risk? This condition is more likely to develop in men over the age of 50 years. What are the signs or symptoms? Symptoms of this condition include:  Getting up often during the night to urinate.  Needing to urinate frequently during the day.  Difficulty starting urine flow.  Decrease in size and strength of your urine stream.  Leaking (dribbling) after urinating.  Inability to pass urine. This needs immediate treatment.  Inability to completely empty your bladder.  Pain when you pass urine. This is more common if there is also an infection.  Urinary tract infection (UTI). How is this diagnosed? This condition is diagnosed based on your medical history, a physical exam, and your symptoms. Tests will also be  done, such as:  A post-void bladder scan. This measures any amount of urine that may remain in your bladder after you finish urinating.  A digital rectal exam. In a rectal exam, your health care provider checks your prostate by putting a lubricated, gloved finger into your rectum to feel the back of your prostate gland. This exam detects the size of your gland and any abnormal lumps or growths.  An exam of your urine (urinalysis).  A prostate specific antigen (PSA) screening. This is a blood test used to screen for prostate cancer.  An ultrasound. This test uses sound waves to electronically produce a picture of your prostate gland. Your health care provider may refer you to a specialist in kidney and prostate diseases (urologist). How is this treated? Once symptoms begin, your health care  provider will monitor your condition (active surveillance or watchful waiting). Treatment for this condition will depend on the severity of your condition. Treatment may include:  Observation and yearly exams. This may be the only treatment needed if your condition and symptoms are mild.  Medicines to relieve your symptoms, including: ? Medicines to shrink the prostate. ? Medicines to relax the muscle of the prostate.  Surgery in severe cases. Surgery may include: ? Prostatectomy. In this procedure, the prostate tissue is removed completely through an open incision or with a laparoscope or robotics. ? Transurethral resection of the prostate (TURP). In this procedure, a tool is inserted through the opening at the tip of the penis (urethra). It is used to cut away tissue of the inner core of the prostate. The pieces are removed through the same opening of the penis. This removes the blockage. ? Transurethral incision (TUIP). In this procedure, small cuts are made in the prostate. This lessens the prostate's pressure on the urethra. ? Transurethral microwave thermotherapy (TUMT). This procedure uses microwaves to create heat. The heat destroys and removes a small amount of prostate tissue. ? Transurethral needle ablation (TUNA). This procedure uses radio frequencies to destroy and remove a small amount of prostate tissue. ? Interstitial laser coagulation (ILC). This procedure uses a laser to destroy and remove a small amount of prostate tissue. ? Transurethral electrovaporization (TUVP). This procedure uses electrodes to destroy and remove a small amount of prostate tissue. ? Prostatic urethral lift. This procedure inserts an implant to push the lobes of the prostate away from the urethra. Follow these instructions at home:  Take over-the-counter and prescription medicines only as told by your health care provider.  Monitor your symptoms for any changes. Contact your health care provider with any  changes.  Avoid drinking large amounts of liquid before going to bed or out in public.  Avoid or reduce how much caffeine or alcohol you drink.  Give yourself time when you urinate.  Keep all follow-up visits as told by your health care provider. This is important. Contact a health care provider if:  You have unexplained back pain.  Your symptoms do not get better with treatment.  You develop side effects from the medicine you are taking.  Your urine becomes very dark or has a bad smell.  Your lower abdomen becomes distended and you have trouble passing your urine. Get help right away if:  You have a fever or chills.  You suddenly cannot urinate.  You feel lightheaded, or very dizzy, or you faint.  There are large amounts of blood or clots in the urine.  Your urinary problems become hard to manage.  You develop  moderate to severe low back or flank pain. The flank is the side of your body between the ribs and the hip. These symptoms may represent a serious problem that is an emergency. Do not wait to see if the symptoms will go away. Get medical help right away. Call your local emergency services (911 in the [place] Do not drive yourself to the hospital. Summary  Benign prostatic hyperplasia (BPH) is an enlarged prostate that is caused by the normal aging process and not by cancer.  An enlarged prostate can press on the urethra. This can make it hard to pass urine.  This condition is part of a normal aging process and is more likely to develop in men over the age of 50 years.  Get help right away if you suddenly cannot urinate. This information is not intended to replace advice given to you by your health care provider. Make sure you discuss any questions you have with your health care provider. Document Revised: 05/21/2020 Document Reviewed: 05/21/2020 Elsevier Patient Education  2021 [employer].

## 2020-11-25 NOTE — Progress Notes (Signed)
   11/25/2020 3:18 PM   Brent Oneill 08-Jun-1971 [id number]  Reason for visit: Follow up elevated PSA, BPH, low libido  HPI: I saw Brent Oneill for the above issues.  I saw him last in November 2021 after he had an acute episode of prostatitis with an elevated PSA of 7.7 at that time.  He was treated with Bactrim and Flomax and noticed relief of his symptoms within a few weeks, and PSA decreased 3 weeks later to 5.9.  DRE at that visit showed a 80 g prostate that was smooth with no nodules or masses.  We opted for a repeat PSA and close follow-up.  Repeat PSA on 11/19/2020 came back down to normal at 4, with very reassuring 25% free.  We discussed at length that his PSA on the upper range of normal is likely secondary to his very enlarged prostate, and unlikely to represent malignancy.  We discussed the importance of ongoing PSA screening on a yearly basis.  Regarding his urinary symptoms, he is doing well and would like to continue the Flomax.  IPSS score today is 14, with quality of life mostly satisfied, and PVR is normal at 0 mL.  We discussed alternatives for his BPH including outlet procedures like HOLEP or UroLift, and need for cystoscopy prior to undergoing any procedures.  He would like to hold off at this time as his symptoms are well controlled.  We reviewed return precautions including gross hematuria, recurrent prostatitis/UTIs, or urinary retention.  He also reports some trouble with low libido over the last few months.  He denies any trouble with erections.  He was prescribed Viagra in the past, but had bothersome side effects.  He is interested in natural ways to increase his libido.  We discussed checking a testosterone, but he would like to hold off on the lab work at this time, as he is not interested in medications.  We discussed strategies to include testosterone including exercise especially with heavy weights and body weight exercises, adequate sleep, and healthy eating.  Flomax  refilled RTC 1 year with PSA prior, IPSS, PVR Consider testosterone in the future if no improvement in libido with above strategies   Sondra Come, MD  [facility] [address] [place], [place] [address] [phone]

## 2021-04-28 ENCOUNTER — Other Ambulatory Visit: Payer: Self-pay | Admitting: Otolaryngology

## 2021-04-28 DIAGNOSIS — J329 Chronic sinusitis, unspecified: Secondary | ICD-10-CM

## 2021-05-08 ENCOUNTER — Other Ambulatory Visit: Payer: BC Managed Care – PPO

## 2021-05-21 ENCOUNTER — Ambulatory Visit
Admission: RE | Admit: 2021-05-21 | Discharge: 2021-05-21 | Disposition: A | Discharge status: Home / Self Care | Payer: BC Managed Care – PPO | Source: Ambulatory Visit | Attending: Otolaryngology | Admitting: Otolaryngology

## 2021-05-21 ENCOUNTER — Other Ambulatory Visit: Payer: Self-pay

## 2021-05-21 DIAGNOSIS — J329 Chronic sinusitis, unspecified: Secondary | ICD-10-CM

## 2021-11-24 ENCOUNTER — Other Ambulatory Visit: Payer: BC Managed Care – PPO

## 2021-11-25 ENCOUNTER — Encounter: Payer: Self-pay | Admitting: Urology

## 2021-12-01 ENCOUNTER — Ambulatory Visit: Payer: Self-pay | Admitting: Urology

## 2021-12-27 ENCOUNTER — Other Ambulatory Visit: Payer: BC Managed Care – PPO

## 2021-12-27 ENCOUNTER — Encounter: Payer: Self-pay | Admitting: Urology

## 2021-12-29 ENCOUNTER — Encounter: Payer: Self-pay | Admitting: Urology

## 2021-12-29 ENCOUNTER — Ambulatory Visit: Payer: BC Managed Care – PPO | Admitting: Urology

## 2022-08-17 ENCOUNTER — Other Ambulatory Visit: Payer: Self-pay

## 2022-08-17 DIAGNOSIS — Z87898 Personal history of other specified conditions: Secondary | ICD-10-CM

## 2022-08-22 ENCOUNTER — Other Ambulatory Visit: Payer: BC Managed Care – PPO

## 2022-08-22 DIAGNOSIS — Z87898 Personal history of other specified conditions: Secondary | ICD-10-CM

## 2022-08-23 LAB — PSA: Prostate Specific Ag, Serum: 6 ng/mL — ABNORMAL HIGH (ref 0.0–4.0)

## 2022-08-25 ENCOUNTER — Ambulatory Visit: Payer: BC Managed Care – PPO | Admitting: Urology

## 2022-08-25 ENCOUNTER — Encounter: Payer: Self-pay | Admitting: Urology

## 2022-08-25 VITALS — BP 129/79 | HR 109 | Ht 66.0 in | Wt 172.0 lb

## 2022-08-25 DIAGNOSIS — F524 Premature ejaculation: Secondary | ICD-10-CM

## 2022-08-25 DIAGNOSIS — N138 Other obstructive and reflux uropathy: Secondary | ICD-10-CM

## 2022-08-25 DIAGNOSIS — N401 Enlarged prostate with lower urinary tract symptoms: Secondary | ICD-10-CM

## 2022-08-25 DIAGNOSIS — R972 Elevated prostate specific antigen [PSA]: Secondary | ICD-10-CM

## 2022-08-25 MED ORDER — TAMSULOSIN HCL 0.4 MG PO CAPS: 0.4000 mg | ORAL_CAPSULE | Freq: Every day | ORAL | 3 refills | Status: DC

## 2022-08-25 NOTE — Patient Instructions (Signed)

## 2022-08-25 NOTE — Progress Notes (Signed)
   08/25/2022 9:09 AM   Brent Oneill 06/25/1971 [id number]  Reason for visit: Follow up elevated PSA, BPH, premature ejaculation  HPI: I saw Brent Oneill for the above issues.  I previously saw him in November 2021 after an episode of an acute prostatitis with an elevated PSA of 7.7 that after treatment with Bactrim decreased to 5.9, then 4 with a very reassuring 25% free.  He does have some obstructive urinary symptoms with weak stream and straining, and has had significant improvement on the Flomax.  PVR has been normal, including 89ml today.  DRE was benign with 80 g prostate.  He notices a significant worsening of his urinary symptoms if he misses a dose of the Flomax.  We discussed options including diet/behavioral strategies, addition of finasteride, or consideration of outlet procedures.  He would like to avoid surgery if at all possible, and would like to continue medications alone.  He did have an increase in his PSA this year to 6.0 from 4.0 previously.  We discussed possible etiologies including BPH or prostate cancer.  With his very reassuring percentage free, this is most likely secondary to BPH, but we reviewed that without prostate MRI or biopsy cannot completely rule out prostate cancer.  He was amenable to a prostate MRI, and understands possible need for biopsy pending those findings.  Will call with MRI results.  If MRI normal, will start finasteride for BPH in addition to his current Flomax.  He also reports premature ejaculation with ejaculation within 3 to 5 minutes of initiating sexual activity.  He is not interested in trying medications for this.  We discussed strategies including topical lidocaine cream, start stop and squeeze technique.  He denies any problems with erections.   Flomax refilled Prostate MRI, call with results.  If benign will add finasteride and plan 1 year follow-up with PVR Strategies discussed regarding premature ejaculation  Sondra Come,  MD  [facility] [address], [address] [place], [place] [address] [phone]

## 2022-09-05 ENCOUNTER — Ambulatory Visit: Payer: BC Managed Care – PPO

## 2022-09-06 ENCOUNTER — Other Ambulatory Visit: Payer: Self-pay

## 2022-09-06 DIAGNOSIS — R972 Elevated prostate specific antigen [PSA]: Secondary | ICD-10-CM

## 2022-10-30 ENCOUNTER — Other Ambulatory Visit: Payer: Self-pay | Admitting: Internal Medicine

## 2022-10-30 DIAGNOSIS — E663 Overweight: Secondary | ICD-10-CM

## 2022-11-28 ENCOUNTER — Other Ambulatory Visit: Payer: Self-pay

## 2022-11-28 MED ORDER — MELOXICAM 15 MG PO TABS: 15.0000 mg | ORAL_TABLET | Freq: Every day | ORAL | 3 refills | Status: DC

## 2022-12-02 ENCOUNTER — Ambulatory Visit: Payer: BC Managed Care – PPO | Admitting: Internal Medicine

## 2022-12-19 ENCOUNTER — Other Ambulatory Visit: Payer: Self-pay | Admitting: Internal Medicine

## 2022-12-19 DIAGNOSIS — I10 Essential (primary) hypertension: Secondary | ICD-10-CM

## 2023-01-07 ENCOUNTER — Other Ambulatory Visit: Payer: Self-pay | Admitting: Internal Medicine

## 2023-01-07 DIAGNOSIS — E663 Overweight: Secondary | ICD-10-CM

## 2023-03-03 ENCOUNTER — Other Ambulatory Visit: Payer: Self-pay | Admitting: Internal Medicine

## 2023-03-09 ENCOUNTER — Other Ambulatory Visit: Payer: BC Managed Care – PPO

## 2023-03-16 ENCOUNTER — Ambulatory Visit: Payer: BC Managed Care – PPO | Admitting: Urology

## 2023-03-16 ENCOUNTER — Encounter: Payer: Self-pay | Admitting: Urology

## 2023-04-06 ENCOUNTER — Other Ambulatory Visit: Payer: Self-pay | Admitting: Nurse Practitioner

## 2023-04-06 DIAGNOSIS — E663 Overweight: Secondary | ICD-10-CM

## 2023-04-11 ENCOUNTER — Other Ambulatory Visit: Payer: Self-pay | Admitting: Internal Medicine

## 2023-04-11 DIAGNOSIS — J45998 Other asthma: Secondary | ICD-10-CM

## 2023-04-12 NOTE — Telephone Encounter (Signed)
 LMTRC

## 2023-04-14 NOTE — Telephone Encounter (Signed)
Pt has appt on 7/30 and Rx has been sent.

## 2023-04-17 ENCOUNTER — Telehealth: Payer: Self-pay | Admitting: Internal Medicine

## 2023-04-17 DIAGNOSIS — I10 Essential (primary) hypertension: Secondary | ICD-10-CM

## 2023-04-17 NOTE — Telephone Encounter (Signed)
Patient left VM wanting a lab test added to his upcoming lab appt. He would like to have the sickle cell trait test added as his daughter has the sickle cell trait. Please advise.

## 2023-04-21 ENCOUNTER — Other Ambulatory Visit: Payer: Self-pay | Admitting: Internal Medicine

## 2023-04-21 ENCOUNTER — Other Ambulatory Visit: Payer: BC Managed Care – PPO

## 2023-04-25 ENCOUNTER — Encounter: Payer: Self-pay | Admitting: Internal Medicine

## 2023-04-25 ENCOUNTER — Ambulatory Visit (INDEPENDENT_AMBULATORY_CARE_PROVIDER_SITE_OTHER): Payer: BC Managed Care – PPO | Admitting: Internal Medicine

## 2023-04-25 VITALS — BP 125/89 | HR 102 | Ht 66.0 in | Wt 177.0 lb

## 2023-04-25 DIAGNOSIS — M791 Myalgia, unspecified site: Secondary | ICD-10-CM

## 2023-04-25 DIAGNOSIS — N400 Enlarged prostate without lower urinary tract symptoms: Secondary | ICD-10-CM

## 2023-04-25 DIAGNOSIS — I10 Essential (primary) hypertension: Secondary | ICD-10-CM | POA: Insufficient documentation

## 2023-04-25 DIAGNOSIS — E7800 Pure hypercholesterolemia, unspecified: Secondary | ICD-10-CM | POA: Diagnosis not present

## 2023-04-25 MED ORDER — CHLORTHALIDONE 25 MG PO TABS: 12.5000 mg | ORAL_TABLET | Freq: Every day | ORAL | 0 refills | Status: DC

## 2023-04-25 NOTE — Progress Notes (Signed)
Established Patient Office Visit  Subjective:  Patient ID: Brent Oneill, male    DOB: 09-Jan-1971  Age: 52 y.o. MRN: [id number]  Chief Complaint  Patient presents with   Follow-up    Follow up    No new complaints, here for lab review and medication refills. Still c/o fatigue and muscle aches. Reports exacerbation of chronic lower back pain. Allergies have improved. LDL improved and TC well controlled on lab review. Triglycerides also satisfactory. Daughter diagnosed with sickle cell trait.    No other concerns at this time.   Past Medical History:  Diagnosis Date   Allergic rhinitis 03/14/2016   Asthma, persistent 03/14/2016    Past Surgical History:  Procedure Laterality Date   NO PAST SURGERIES      Social History   Socioeconomic History   Marital status: Married    Spouse name: Not on file   Number of children: Not on file   Years of education: Not on file   Highest education level: Not on file  Occupational History   Not on file  Tobacco Use   Smoking status: Never   Smokeless tobacco: Never  Substance and Sexual Activity   Alcohol use: Yes    Alcohol/week: 0.0 standard drinks of alcohol   Drug use: No   Sexual activity: Not on file  Other Topics Concern   Not on file  Social History Narrative   Not on file   Social Determinants of Health   Financial Resource Strain: Not on file  Food Insecurity: Not on file  Transportation Needs: Not on file  Physical Activity: Not on file  Stress: Not on file  Social Connections: Not on file  Intimate Partner Violence: Not on file    Family History  Problem Relation Age of Onset   Kidney cancer Neg Hx    Prostate cancer Neg Hx     Allergies  Allergen Reactions   Quinine Derivatives Itching    Review of Systems  Constitutional: Negative.   HENT: Negative.    Eyes: Negative.   Respiratory: Negative.    Cardiovascular: Negative.   Gastrointestinal: Negative.   Genitourinary: Negative.    Musculoskeletal:  Positive for myalgias.  Skin: Negative.   Neurological: Negative.   Endo/Heme/Allergies: Negative.        Objective:   BP 125/89   Pulse (!) 102   Ht 5\' 6"  (1.676 m)   Wt 177 lb (80.3 kg)   SpO2 98%   BMI 28.57 kg/m   Vitals:   04/25/23 1523  BP: 125/89  Pulse: (!) 102  Height: 5\' 6"  (1.676 m)  Weight: 177 lb (80.3 kg)  SpO2: 98%  BMI (Calculated): 28.58    Physical Exam Vitals reviewed.  Constitutional:      Appearance: Normal appearance.  HENT:     Head: Normocephalic.     Left Ear: There is no impacted cerumen.     Nose: Nose normal.     Mouth/Throat:     Mouth: Mucous membranes are moist.     Pharynx: No posterior oropharyngeal erythema.  Eyes:     Extraocular Movements: Extraocular movements intact.     Pupils: Pupils are equal, round, and reactive to light.  Cardiovascular:     Rate and Rhythm: Regular rhythm.     Chest Wall: PMI is not displaced.     Pulses: Normal pulses.     Heart sounds: Normal heart sounds. No murmur heard. Pulmonary:     Effort: Pulmonary effort is normal.  Breath sounds: Normal air entry. No rhonchi or rales.  Abdominal:     General: Abdomen is flat. Bowel sounds are normal. There is no distension.     Palpations: Abdomen is soft. There is no hepatomegaly, splenomegaly or mass.     Tenderness: There is no abdominal tenderness.  Musculoskeletal:        General: Normal range of motion.     Cervical back: Normal range of motion and neck supple.     Right lower leg: No edema.     Left lower leg: No edema.  Skin:    General: Skin is warm and dry.  Neurological:     General: No focal deficit present.     Mental Status: He is alert and oriented to person, place, and time.     Cranial Nerves: No cranial nerve deficit.     Motor: No weakness.  Psychiatric:        Mood and Affect: Mood normal.        Behavior: Behavior normal.      No results found for any visits on 04/25/23.  Recent Results (from  the past 2160 hour([place]))  Comprehensive metabolic panel     Status: None   Collection Time: 04/21/23  8:33 AM  Result Value Ref Range   Glucose 91 70 - 99 mg/dL   BUN 13 6 - 24 mg/dL   Creatinine, Ser 8.46 0.76 - 1.27 mg/dL   eGFR 71 >96 EX/BMW/4.13   BUN/Creatinine Ratio 11 9 - 20   Sodium 141 134 - 144 mmol/L   Potassium 4.0 3.5 - 5.2 mmol/L   Chloride 103 96 - 106 mmol/L   CO2 25 20 - 29 mmol/L   Calcium 9.1 8.7 - 10.2 mg/dL   Total Protein 6.2 6.0 - 8.5 g/dL   Albumin 4.0 3.8 - 4.9 g/dL   Globulin, Total 2.2 1.5 - 4.5 g/dL   Bilirubin Total 0.3 0.0 - 1.2 mg/dL   Alkaline Phosphatase 53 44 - 121 IU/L   AST 18 0 - 40 IU/L   ALT 20 0 - 44 IU/L  Lipid Panel w/o Chol/HDL Ratio     Status: Abnormal   Collection Time: 04/21/23  8:33 AM  Result Value Ref Range   Cholesterol, Total 191 100 - 199 mg/dL   Triglycerides 244 0 - 149 mg/dL   HDL 45 >01 mg/dL   VLDL Cholesterol Cal 26 5 - 40 mg/dL   LDL Chol Calc (NIH) 027 (H) 0 - 99 mg/dL      Assessment & Plan:  As per problem list. Problem List Items Addressed This Visit       Cardiovascular and Mediastinum   Essential (primary) hypertension   Relevant Medications   chlorthalidone (HYGROTON) 25 MG tablet   Other Relevant Orders   CBC With Diff/Platelet     Other   Pure hypercholesterolemia   Relevant Medications   chlorthalidone (HYGROTON) 25 MG tablet   Other Relevant Orders   Comprehensive metabolic panel   Lipid panel   Other Visit Diagnoses     Myalgia    -  Primary   Relevant Orders   Ambulatory referral to Neurology   Benign prostatic hyperplasia without lower urinary tract symptoms       Relevant Orders   PSA       Return in about 3 months (around 07/26/2023) for cpe with labs prior.   Total time spent: 20 minutes  Luna Fuse, MD  04/25/2023   This  document may have been prepared by [employer] Voice Recognition software and as such may include unintentional dictation errors.

## 2023-05-07 ENCOUNTER — Other Ambulatory Visit: Payer: Self-pay | Admitting: Internal Medicine

## 2023-07-06 ENCOUNTER — Ambulatory Visit (INDEPENDENT_AMBULATORY_CARE_PROVIDER_SITE_OTHER): Payer: BC Managed Care – PPO | Admitting: Otolaryngology

## 2023-07-14 ENCOUNTER — Ambulatory Visit (INDEPENDENT_AMBULATORY_CARE_PROVIDER_SITE_OTHER): Payer: BC Managed Care – PPO

## 2023-07-14 ENCOUNTER — Encounter (INDEPENDENT_AMBULATORY_CARE_PROVIDER_SITE_OTHER): Payer: Self-pay

## 2023-07-14 VITALS — Ht 66.0 in | Wt 173.0 lb

## 2023-07-14 DIAGNOSIS — R0981 Nasal congestion: Secondary | ICD-10-CM | POA: Diagnosis not present

## 2023-07-14 DIAGNOSIS — J338 Other polyp of sinus: Secondary | ICD-10-CM

## 2023-07-14 DIAGNOSIS — J324 Chronic pansinusitis: Secondary | ICD-10-CM

## 2023-07-14 DIAGNOSIS — J310 Chronic rhinitis: Secondary | ICD-10-CM

## 2023-07-15 DIAGNOSIS — J338 Other polyp of sinus: Secondary | ICD-10-CM | POA: Insufficient documentation

## 2023-07-15 DIAGNOSIS — J324 Chronic pansinusitis: Secondary | ICD-10-CM | POA: Insufficient documentation

## 2023-07-15 NOTE — Progress Notes (Unsigned)
Patient ID: Brent Oneill, male   DOB: 21-Jan-1971, 52 y.o.   MRN: [id number]  Follow-up: Chronic rhinosinusitis and polyposis  HPI: The patient is a 52 year old male who returns today for his follow-up evaluation.  The patient has a history of chronic rhinosinusitis and polyposis, involving the maxillary, frontal, and ethmoid sinuses. He underwent bilateral endoscopic sinus surgery and bilateral turbinate reduction in October 2022.  The patient returns today reporting intermittent nasal congestion and facial pressure.  He was recently treated with 1 dose of prednisone.  However, he continues to have anosmia.  Currently he denies any facial pain, fever, or visual change.  Exam: General: Communicates without difficulty, well nourished, no acute distress. Head: Normocephalic, no evidence injury, no tenderness, facial buttresses intact without stepoff. Face/sinus: No tenderness to palpation and percussion. Facial movement is normal and symmetric. Eyes: PERRL, EOMI. No scleral icterus, conjunctivae clear. Neuro: CN II exam reveals vision grossly intact.  No nystagmus at any point of gaze. Ears: Auricles well formed without lesions.  Ear canals are intact without mass or lesion.  No erythema or edema is appreciated.  The TMs are intact without fluid. Nose: External evaluation reveals normal support and skin without lesions.  Dorsum is intact.  Anterior rhinoscopy reveals congested mucosa over anterior aspect of inferior turbinates and intact septum.  No purulence noted. Oral:  Oral cavity and oropharynx are intact, symmetric, without erythema or edema.  Mucosa is moist without lesions. Neck: Full range of motion without pain.  There is no significant lymphadenopathy.  No masses palpable.  Thyroid bed within normal limits to palpation.  Parotid glands and submandibular glands equal bilaterally without mass.  Trachea is midline. Neuro:  CN 2-12 grossly intact.  A flexible scope was inserted into the right nasal cavity.   Endoscopy of the interior nasal cavity, superior, inferior, and middle meatus was performed. The sphenoid-ethmoid recess was examined. Edematous mucosa was noted.  No polyp, mass, or lesion was appreciated.  The sinus openings were widely patent.  Olfactory cleft was clear.  Nasopharynx was clear.  Turbinates were well-healed. The procedure was repeated on the contralateral side with similar findings.    Assessment: 1.  Chronic rhinitis with moderate nasal mucosal congestion. 2.  The sinus openings are widely patent secondary to his recent surgery. 3.  History of chronic rhinosinusitis and diffuse polyposis.  Plan: 1.  The nasal endoscopy findings are reviewed with the patient. 2.  Continue with Flonase nasal spray daily. 3.  A prescription for prednisone Dosepak is given to the patient in case his nasal congestion/ nasal polyp recurs.  4.  The patient will return for reevaluation in 6 months.

## 2023-07-31 ENCOUNTER — Encounter: Payer: BC Managed Care – PPO | Admitting: Internal Medicine

## 2023-08-11 ENCOUNTER — Other Ambulatory Visit: Payer: Self-pay

## 2023-08-11 ENCOUNTER — Other Ambulatory Visit: Payer: BC Managed Care – PPO

## 2023-08-11 DIAGNOSIS — E7800 Pure hypercholesterolemia, unspecified: Secondary | ICD-10-CM

## 2023-08-11 DIAGNOSIS — N400 Enlarged prostate without lower urinary tract symptoms: Secondary | ICD-10-CM

## 2023-08-11 DIAGNOSIS — I10 Essential (primary) hypertension: Secondary | ICD-10-CM

## 2023-08-12 LAB — COMPREHENSIVE METABOLIC PANEL
ALT: 16 [IU]/L (ref 0–44)
AST: 14 [IU]/L (ref 0–40)
Albumin: 4.4 g/dL (ref 3.8–4.9)
Alkaline Phosphatase: 55 [IU]/L (ref 44–121)
BUN/Creatinine Ratio: 11 (ref 9–20)
BUN: 13 mg/dL (ref 6–24)
Bilirubin Total: 0.6 mg/dL (ref 0.0–1.2)
CO2: 27 mmol/L (ref 20–29)
Calcium: 9.9 mg/dL (ref 8.7–10.2)
Chloride: 99 mmol/L (ref 96–106)
Creatinine, Ser: 1.19 mg/dL (ref 0.76–1.27)
Globulin, Total: 2.3 g/dL (ref 1.5–4.5)
Glucose: 101 mg/dL — ABNORMAL HIGH (ref 70–99)
Potassium: 3.5 mmol/L (ref 3.5–5.2)
Sodium: 139 mmol/L (ref 134–144)
Total Protein: 6.7 g/dL (ref 6.0–8.5)
eGFR: 74 mL/min/{1.73_m2} (ref >59)

## 2023-08-12 LAB — CBC WITH DIFF/PLATELET
Basophils Absolute: 0 10*3/uL (ref 0.0–0.2)
Basos: 1 %
EOS (ABSOLUTE): 0 10*3/uL (ref 0.0–0.4)
Eos: 1 %
Hematocrit: 43.9 % (ref 37.5–51.0)
Hemoglobin: 14.7 g/dL (ref 13.0–17.7)
Immature Grans (Abs): 0 10*3/uL (ref 0.0–0.1)
Immature Granulocytes: 0 %
Lymphocytes Absolute: 2 10*3/uL (ref 0.7–3.1)
Lymphs: 30 %
MCH: 26.9 pg (ref 26.6–33.0)
MCHC: 33.5 g/dL (ref 31.5–35.7)
MCV: 80 fL (ref 79–97)
Monocytes Absolute: 0.6 10*3/uL (ref 0.1–0.9)
Monocytes: 9 %
Neutrophils Absolute: 3.9 10*3/uL (ref 1.4–7.0)
Neutrophils: 59 %
Platelets: 210 10*3/uL (ref 150–450)
RBC: 5.46 x10E6/uL (ref 4.14–5.80)
RDW: 12.6 % (ref 11.6–15.4)
WBC: 6.6 10*3/uL (ref 3.4–10.8)

## 2023-08-12 LAB — LIPID PANEL
Chol/HDL Ratio: 3.5 ratio (ref 0.0–5.0)
Cholesterol, Total: 194 mg/dL (ref 100–199)
HDL: 55 mg/dL (ref >39)
LDL Chol Calc (NIH): 110 mg/dL — ABNORMAL HIGH (ref 0–99)
Triglycerides: 166 mg/dL — ABNORMAL HIGH (ref 0–149)
VLDL Cholesterol Cal: 29 mg/dL (ref 5–40)

## 2023-08-12 LAB — PSA: Prostate Specific Ag, Serum: 3.9 ng/mL (ref 0.0–4.0)

## 2023-08-14 LAB — SICKLE CELL SCREEN: Sickle Cell Screen: POSITIVE — AB

## 2023-08-16 ENCOUNTER — Encounter: Payer: Self-pay | Admitting: Internal Medicine

## 2023-08-16 ENCOUNTER — Ambulatory Visit (INDEPENDENT_AMBULATORY_CARE_PROVIDER_SITE_OTHER): Payer: BC Managed Care – PPO | Admitting: Internal Medicine

## 2023-08-16 VITALS — BP 112/74 | HR 98 | Ht 66.0 in | Wt 179.0 lb

## 2023-08-16 DIAGNOSIS — Z1331 Encounter for screening for depression: Secondary | ICD-10-CM

## 2023-08-16 DIAGNOSIS — I10 Essential (primary) hypertension: Secondary | ICD-10-CM | POA: Diagnosis not present

## 2023-08-16 DIAGNOSIS — E7800 Pure hypercholesterolemia, unspecified: Secondary | ICD-10-CM

## 2023-08-16 DIAGNOSIS — Z0001 Encounter for general adult medical examination with abnormal findings: Secondary | ICD-10-CM | POA: Diagnosis not present

## 2023-08-16 DIAGNOSIS — R7303 Prediabetes: Secondary | ICD-10-CM | POA: Diagnosis not present

## 2023-08-16 DIAGNOSIS — D573 Sickle-cell trait: Secondary | ICD-10-CM | POA: Insufficient documentation

## 2023-08-16 MED ORDER — MELOXICAM 15 MG PO TABS: 15.0000 mg | ORAL_TABLET | Freq: Every day | ORAL | 2 refills | Status: DC

## 2023-08-16 MED ORDER — CHLORTHALIDONE 25 MG PO TABS: 12.5000 mg | ORAL_TABLET | Freq: Every day | ORAL | 0 refills | Status: DC

## 2023-08-16 NOTE — Progress Notes (Signed)
Established Patient Office Visit  Subjective:  Patient ID: Brent Oneill, male    DOB: 1971-08-04  Age: 52 y.o. MRN: [id number]  Chief Complaint  Patient presents with   Annual Exam    CPE and discuss lab results    No new complaints, here for CPE, lab review and medication refills. Labs reviewed and notable for improved psa, elevated fasting glucose, elevated LDL and unremarkable cmp.     No other concerns at this time.   Past Medical History:  Diagnosis Date   Allergic rhinitis 03/14/2016   Asthma, persistent 03/14/2016    Past Surgical History:  Procedure Laterality Date   NO PAST SURGERIES      Social History   Socioeconomic History   Marital status: Married    Spouse name: Not on file   Number of children: Not on file   Years of education: Not on file   Highest education level: Not on file  Occupational History   Not on file  Tobacco Use   Smoking status: Never   Smokeless tobacco: Never  Substance and Sexual Activity   Alcohol use: Yes    Alcohol/week: 0.0 standard drinks of alcohol   Drug use: No   Sexual activity: Not on file  Other Topics Concern   Not on file  Social History Narrative   Not on file   Social Determinants of Health   Financial Resource Strain: Not on file  Food Insecurity: Not on file  Transportation Needs: Not on file  Physical Activity: Not on file  Stress: Not on file  Social Connections: Not on file  Intimate Partner Violence: Not on file    Family History  Problem Relation Age of Onset   Kidney cancer Neg Hx    Prostate cancer Neg Hx     Allergies  Allergen Reactions   Quinine Derivatives Itching    Outpatient Medications Prior to Visit  Medication Sig   BELSOMRA 10 MG TABS Take 1 tablet by mouth at bedtime.   chlorthalidone (HYGROTON) 25 MG tablet Take 0.5 tablets (12.5 mg total) by mouth daily.   fluticasone (FLONASE) 50 MCG/ACT nasal spray SPRAY 2 SPRAYS INTO EACH NOSTRIL EVERY DAY   fluticasone  furoate-vilanterol (BREO ELLIPTA) 200-25 MCG/ACT AEPB INHALE 1 PUFF BY MOUTH ONCE DAILY * NEEDS APPT AND LABS   meloxicam (MOBIC) 15 MG tablet TAKE 1 TABLET (15 MG TOTAL) BY MOUTH DAILY.   tamsulosin (FLOMAX) 0.4 MG CAPS capsule Take 1 capsule (0.4 mg total) by mouth daily.   budesonide (PULMICORT) 0.5 MG/2ML nebulizer solution Take by nebulization. (Patient not taking: Reported on 08/16/2023)   Phendimetrazine Tartrate 35 MG TABS TAKE 1 TABLET BY MOUTH THREE TIMES A DAY (Patient not taking: Reported on 08/16/2023)   No facility-administered medications prior to visit.    Review of Systems  Constitutional:  Negative for weight loss (Gained 6 lbs).       Objective:   BP 112/74   Pulse 98   Ht 5\' 6"  (1.676 m)   Wt 179 lb (81.2 kg)   SpO2 97%   BMI 28.89 kg/m   Vitals:   08/16/23 0928  BP: 112/74  Pulse: 98  Height: 5\' 6"  (1.676 m)  Weight: 179 lb (81.2 kg)  SpO2: 97%  BMI (Calculated): 28.91    Physical Exam Vitals reviewed.  Constitutional:      Appearance: Normal appearance.  HENT:     Head: Normocephalic.     Left Ear: There is no impacted cerumen.  Nose: Nose normal.     Mouth/Throat:     Mouth: Mucous membranes are moist.     Pharynx: No posterior oropharyngeal erythema.  Eyes:     Extraocular Movements: Extraocular movements intact.     Pupils: Pupils are equal, round, and reactive to light.  Cardiovascular:     Rate and Rhythm: Regular rhythm.     Chest Wall: PMI is not displaced.     Pulses: Normal pulses.     Heart sounds: Normal heart sounds. No murmur heard. Pulmonary:     Effort: Pulmonary effort is normal.     Breath sounds: Normal air entry. No rhonchi or rales.  Abdominal:     General: Abdomen is flat. Bowel sounds are normal. There is no distension.     Palpations: Abdomen is soft. There is no hepatomegaly, splenomegaly or mass.     Tenderness: There is no abdominal tenderness.  Musculoskeletal:        General: Normal range of motion.      Cervical back: Normal range of motion and neck supple.     Right lower leg: No edema.     Left lower leg: No edema.  Skin:    General: Skin is warm and dry.  Neurological:     General: No focal deficit present.     Mental Status: He is alert and oriented to person, place, and time.     Cranial Nerves: No cranial nerve deficit.     Motor: No weakness.  Psychiatric:        Mood and Affect: Mood normal.        Behavior: Behavior normal.      No results found for any visits on 08/16/23.  Recent Results (from the past 2160 hour(Alanah Sakuma))  Sickle cell screen     Status: Abnormal   Collection Time: 08/11/23  8:37 AM  Result Value Ref Range   Sickle Cell Screen Positive (A) Negative    Comment: Since a variety of conditions and other abnormal hemoglobins in addition to Hemoglobin Diamonte Stavely may give false-positive results, positive Hemoglobin Solubility tests should be confirmed by hemoglobin fractionation testing.   PSA     Status: None   Collection Time: 08/11/23  9:48 AM  Result Value Ref Range   Prostate Specific Ag, Serum 3.9 0.0 - 4.0 ng/mL    Comment: Roche ECLIA methodology. According to the American Urological Association, Serum PSA should decrease and remain at undetectable levels after radical prostatectomy. The AUA defines biochemical recurrence as an initial PSA value 0.2 ng/mL or greater followed by a subsequent confirmatory PSA value 0.2 ng/mL or greater. Values obtained with different assay methods or kits cannot be used interchangeably. Results cannot be interpreted as absolute evidence of the presence or absence of malignant disease.   Lipid panel     Status: Abnormal   Collection Time: 08/11/23  9:48 AM  Result Value Ref Range   Cholesterol, Total 194 100 - 199 mg/dL   Triglycerides 161 (H) 0 - 149 mg/dL   HDL 55 >09 mg/dL   VLDL Cholesterol Cal 29 5 - 40 mg/dL   LDL Chol Calc (NIH) 604 (H) 0 - 99 mg/dL   Chol/HDL Ratio 3.5 0.0 - 5.0 ratio    Comment:                                    T. Chol/HDL Ratio  Men  Women                               1/2 Avg.Risk  3.4    3.3                                   Avg.Risk  5.0    4.4                                2X Avg.Risk  9.6    7.1                                3X Avg.Risk 23.4   11.0   Comprehensive metabolic panel     Status: Abnormal   Collection Time: 08/11/23  9:48 AM  Result Value Ref Range   Glucose 101 (H) 70 - 99 mg/dL   BUN 13 6 - 24 mg/dL   Creatinine, Ser 9.60 0.76 - 1.27 mg/dL   eGFR 74 >45 WU/JWJ/1.91   BUN/Creatinine Ratio 11 9 - 20   Sodium 139 134 - 144 mmol/L   Potassium 3.5 3.5 - 5.2 mmol/L   Chloride 99 96 - 106 mmol/L   CO2 27 20 - 29 mmol/L   Calcium 9.9 8.7 - 10.2 mg/dL   Total Protein 6.7 6.0 - 8.5 g/dL   Albumin 4.4 3.8 - 4.9 g/dL   Globulin, Total 2.3 1.5 - 4.5 g/dL   Bilirubin Total 0.6 0.0 - 1.2 mg/dL   Alkaline Phosphatase 55 44 - 121 IU/L   AST 14 0 - 40 IU/L   ALT 16 0 - 44 IU/L  CBC With Diff/Platelet     Status: None   Collection Time: 08/11/23  9:48 AM  Result Value Ref Range   WBC 6.6 3.4 - 10.8 x10E3/uL   RBC 5.46 4.14 - 5.80 x10E6/uL   Hemoglobin 14.7 13.0 - 17.7 g/dL   Hematocrit 47.8 29.5 - 51.0 %   MCV 80 79 - 97 fL   MCH 26.9 26.6 - 33.0 pg   MCHC 33.5 31.5 - 35.7 g/dL   RDW 62.1 30.8 - 65.7 %   Platelets 210 150 - 450 x10E3/uL   Neutrophils 59 Not Estab. %   Lymphs 30 Not Estab. %   Monocytes 9 Not Estab. %   Eos 1 Not Estab. %   Basos 1 Not Estab. %   Neutrophils Absolute 3.9 1.4 - 7.0 x10E3/uL   Lymphocytes Absolute 2.0 0.7 - 3.1 x10E3/uL   Monocytes Absolute 0.6 0.1 - 0.9 x10E3/uL   EOS (ABSOLUTE) 0.0 0.0 - 0.4 x10E3/uL   Basophils Absolute 0.0 0.0 - 0.2 x10E3/uL   Immature Granulocytes 0 Not Estab. %   Immature Grans (Abs) 0.0 0.0 - 0.1 x10E3/uL      Assessment & Plan:  As per problem list. Stricter low calorie diet, low cholesterol and low fat diet and exercise as much as  possible.   Problem List Items Addressed This Visit       Cardiovascular and Mediastinum   Essential (primary) hypertension     Other   Pure hypercholesterolemia   Sickle cell trait (HCC)   Prediabetes   Other Visit Diagnoses     Annual visit for general adult medical  examination with abnormal findings    -  Primary       Return in about 3 months (around 11/16/2023) for fu with labs prior.   Total time spent: 30 minutes  Luna Fuse, MD  08/16/2023   This document may have been prepared by [facility] Voice Recognition software and as such may include unintentional dictation errors.

## 2023-08-18 LAB — HEMOGLOBIN A1C
Est. average glucose Bld gHb Est-mCnc: 137 mg/dL
Hgb A1c MFr Bld: 6.4 % — ABNORMAL HIGH (ref 4.8–5.6)

## 2023-08-18 LAB — SPECIMEN STATUS REPORT

## 2023-08-29 ENCOUNTER — Ambulatory Visit: Payer: BC Managed Care – PPO | Admitting: Internal Medicine

## 2023-08-29 ENCOUNTER — Encounter: Payer: Self-pay | Admitting: Internal Medicine

## 2023-08-29 VITALS — BP 134/84 | HR 105 | Ht 66.0 in | Wt 177.0 lb

## 2023-08-29 DIAGNOSIS — M25552 Pain in left hip: Secondary | ICD-10-CM | POA: Diagnosis not present

## 2023-08-29 DIAGNOSIS — M545 Low back pain, unspecified: Secondary | ICD-10-CM | POA: Diagnosis not present

## 2023-08-29 DIAGNOSIS — M76892 Other specified enthesopathies of left lower limb, excluding foot: Secondary | ICD-10-CM | POA: Diagnosis not present

## 2023-08-29 LAB — POCT URINALYSIS DIPSTICK
Bilirubin, UA: NEGATIVE
Blood, UA: NEGATIVE
Glucose, UA: NEGATIVE
Ketones, UA: NEGATIVE
Leukocytes, UA: NEGATIVE
Nitrite, UA: NEGATIVE
Protein, UA: NEGATIVE
Spec Grav, UA: 1.01 (ref 1.010–1.025)
Urobilinogen, UA: 0.2 U/dL
pH, UA: 8 (ref 5.0–8.0)

## 2023-08-29 MED ORDER — CYCLOBENZAPRINE HCL 10 MG PO TABS: 10.0000 mg | ORAL_TABLET | Freq: Three times a day (TID) | ORAL | 0 refills | Status: DC | PRN

## 2023-08-29 MED ORDER — IBUPROFEN 800 MG PO TABS: 800.0000 mg | ORAL_TABLET | Freq: Three times a day (TID) | ORAL | 0 refills | Status: DC | PRN

## 2023-08-29 MED ORDER — METHYLPREDNISOLONE ACETATE 40 MG/ML IJ SUSP
40.0000 mg | Freq: Once | INTRAMUSCULAR | Status: AC
  Administered 2023-08-29: 80 mg via INTRAMUSCULAR

## 2023-08-29 MED ORDER — PREDNISONE 20 MG PO TABS: 40.0000 mg | ORAL_TABLET | Freq: Every day | ORAL | 0 refills | Status: AC

## 2023-08-29 MED ORDER — METHYLPREDNISOLONE ACETATE 80 MG/ML IJ SUSP
80.0000 mg | Freq: Once | INTRAMUSCULAR | Status: DC
Start: 2023-08-29 — End: 2023-08-29

## 2023-08-29 NOTE — Addendum Note (Signed)
Addended byKatherine Mantle on: 08/29/2023 04:11 PM   Modules accepted: Orders

## 2023-08-29 NOTE — Progress Notes (Signed)
Established Patient Office Visit  Subjective:  Patient ID: Brent Oneill, male    DOB: 1970/12/08  Age: 52 y.o. MRN: [id number]  Chief Complaint  Patient presents with   Acute Visit    Lower back and hip pain    C/o exacerbation of lower back and hip pain exacerbated by bending over and raising his leg x 1 yr.    No other concerns at this time.   Past Medical History:  Diagnosis Date   Allergic rhinitis 03/14/2016   Asthma, persistent 03/14/2016    Past Surgical History:  Procedure Laterality Date   NO PAST SURGERIES      Social History   Socioeconomic History   Marital status: Married    Spouse name: Not on file   Number of children: Not on file   Years of education: Not on file   Highest education level: Not on file  Occupational History   Not on file  Tobacco Use   Smoking status: Never   Smokeless tobacco: Never  Substance and Sexual Activity   Alcohol use: Yes    Alcohol/week: 0.0 standard drinks of alcohol   Drug use: No   Sexual activity: Not on file  Other Topics Concern   Not on file  Social History Narrative   Not on file   Social Determinants of Health   Financial Resource Strain: Not on file  Food Insecurity: Not on file  Transportation Needs: Not on file  Physical Activity: Not on file  Stress: Not on file  Social Connections: Not on file  Intimate Partner Violence: Not on file    Family History  Problem Relation Age of Onset   Kidney cancer Neg Hx    Prostate cancer Neg Hx     Allergies  Allergen Reactions   Quinine Derivatives Itching    Outpatient Medications Prior to Visit  Medication Sig   BELSOMRA 10 MG TABS Take 1 tablet by mouth at bedtime.   chlorthalidone (HYGROTON) 25 MG tablet Take 0.5 tablets (12.5 mg total) by mouth daily.   fluticasone (FLONASE) 50 MCG/ACT nasal spray SPRAY 2 SPRAYS INTO EACH NOSTRIL EVERY DAY   fluticasone furoate-vilanterol (BREO ELLIPTA) 200-25 MCG/ACT AEPB INHALE 1 PUFF BY MOUTH ONCE DAILY *  NEEDS APPT AND LABS   meloxicam (MOBIC) 15 MG tablet Take 1 tablet (15 mg total) by mouth daily.   tamsulosin (FLOMAX) 0.4 MG CAPS capsule Take 1 capsule (0.4 mg total) by mouth daily.   budesonide (PULMICORT) 0.5 MG/2ML nebulizer solution Take by nebulization. (Patient not taking: Reported on 08/16/2023)   Phendimetrazine Tartrate 35 MG TABS TAKE 1 TABLET BY MOUTH THREE TIMES A DAY (Patient not taking: Reported on 08/16/2023)   No facility-administered medications prior to visit.    Review of Systems  Constitutional: Negative.   HENT: Negative.    Eyes: Negative.   Respiratory: Negative.    Cardiovascular: Negative.   Gastrointestinal: Negative.   Genitourinary: Negative.   Musculoskeletal:  Positive for joint pain.  Skin: Negative.   Neurological: Negative.   Endo/Heme/Allergies: Negative.        Objective:   BP 134/84   Pulse (!) 105   Ht 5\' 6"  (1.676 m)   Wt 177 lb (80.3 kg)   SpO2 98%   BMI 28.57 kg/m   Vitals:   08/29/23 1519  BP: 134/84  Pulse: (!) 105  Height: 5\' 6"  (1.676 m)  Weight: 177 lb (80.3 kg)  SpO2: 98%  BMI (Calculated): 28.58  Physical Exam Vitals reviewed.  Constitutional:      Appearance: Normal appearance.  HENT:     Head: Normocephalic.     Left Ear: There is no impacted cerumen.     Nose: Nose normal.     Mouth/Throat:     Mouth: Mucous membranes are moist.     Pharynx: No posterior oropharyngeal erythema.  Eyes:     Extraocular Movements: Extraocular movements intact.     Pupils: Pupils are equal, round, and reactive to light.  Cardiovascular:     Rate and Rhythm: Regular rhythm.     Chest Wall: PMI is not displaced.     Pulses: Normal pulses.     Heart sounds: Normal heart sounds. No murmur heard. Pulmonary:     Effort: Pulmonary effort is normal.     Breath sounds: Normal air entry. No rhonchi or rales.  Abdominal:     General: Abdomen is flat. Bowel sounds are normal. There is no distension.     Palpations: Abdomen is  soft. There is no hepatomegaly, splenomegaly or mass.     Tenderness: There is no abdominal tenderness.  Musculoskeletal:        General: Normal range of motion.     Cervical back: Normal range of motion and neck supple.     Right hip: Normal. Normal range of motion.     Right lower leg: No edema.     Left lower leg: No edema.     Comments: Tenderness left groin, pain exacerbated by flexion and internal rotation of thigh  Skin:    General: Skin is warm and dry.  Neurological:     General: No focal deficit present.     Mental Status: He is alert and oriented to person, place, and time.     Cranial Nerves: No cranial nerve deficit.     Motor: No weakness.  Psychiatric:        Mood and Affect: Mood normal.        Behavior: Behavior normal.      Results for orders placed or performed in visit on 08/29/23  POCT Urinalysis Dipstick (81002)  Result Value Ref Range   Color, UA yellow    Clarity, UA clear    Glucose, UA Negative Negative   Bilirubin, UA neg    Ketones, UA neg    Spec Grav, UA 1.010 1.010 - 1.025   Blood, UA neg    pH, UA 8.0 5.0 - 8.0   Protein, UA Negative Negative   Urobilinogen, UA 0.2 0.2 or 1.0 E.U./dL   Nitrite, UA neg    Leukocytes, UA Negative Negative   Appearance clear    Odor no         Assessment & Plan:  As per problem list  Problem List Items Addressed This Visit       Musculoskeletal and Integument   Hip flexor tendinitis, left   Relevant Medications   methylPREDNISolone acetate (DEPO-MEDROL) injection 80 mg (Start on 08/29/2023  4:00 PM)   predniSONE (DELTASONE) 20 MG tablet (Start on 08/30/2023)   ibuprofen (ADVIL) 800 MG tablet   cyclobenzaprine (FLEXERIL) 10 MG tablet     Other   Left hip pain   Relevant Orders   DG Lumbar Spine Complete   DG Hip Unilat W OR W/O Pelvis Min 4 Views Left   Low back pain - Primary   Relevant Medications   methylPREDNISolone acetate (DEPO-MEDROL) injection 80 mg (Start on 08/29/2023  4:00 PM)  predniSONE (DELTASONE) 20 MG tablet (Start on 08/30/2023)   ibuprofen (ADVIL) 800 MG tablet   cyclobenzaprine (FLEXERIL) 10 MG tablet   Other Relevant Orders   POCT Urinalysis Dipstick (91478) (Completed)    Return if symptoms worsen or fail to improve.   Total time spent: 20 minutes  Luna Fuse, MD  08/29/2023   This document may have been prepared by [facility] Voice Recognition software and as such may include unintentional dictation errors.

## 2023-09-05 ENCOUNTER — Other Ambulatory Visit: Payer: BC Managed Care – PPO

## 2023-09-05 ENCOUNTER — Other Ambulatory Visit (INDEPENDENT_AMBULATORY_CARE_PROVIDER_SITE_OTHER): Payer: BC Managed Care – PPO

## 2023-09-05 ENCOUNTER — Ambulatory Visit: Payer: BC Managed Care – PPO

## 2023-09-05 DIAGNOSIS — M25552 Pain in left hip: Secondary | ICD-10-CM | POA: Diagnosis not present

## 2023-09-09 ENCOUNTER — Encounter
Admission: RE | Disposition: A | Discharge status: Home / Self Care | Payer: Self-pay | Source: Home / Self Care | Attending: Internal Medicine

## 2023-09-09 ENCOUNTER — Inpatient Hospital Stay
Admission: RE | Admit: 2023-09-09 | Discharge: 2023-09-13 | DRG: 321 | Disposition: A | Discharge status: Home / Self Care | Payer: BC Managed Care – PPO | Attending: Internal Medicine | Admitting: Internal Medicine

## 2023-09-09 DIAGNOSIS — I2111 ST elevation (STEMI) myocardial infarction involving right coronary artery: Secondary | ICD-10-CM

## 2023-09-09 DIAGNOSIS — R000 Tachycardia, unspecified: Secondary | ICD-10-CM | POA: Diagnosis present

## 2023-09-09 DIAGNOSIS — J4540 Moderate persistent asthma, uncomplicated: Secondary | ICD-10-CM | POA: Diagnosis present

## 2023-09-09 DIAGNOSIS — E785 Hyperlipidemia, unspecified: Secondary | ICD-10-CM | POA: Diagnosis present

## 2023-09-09 DIAGNOSIS — Z7902 Long term (current) use of antithrombotics/antiplatelets: Secondary | ICD-10-CM | POA: Diagnosis not present

## 2023-09-09 DIAGNOSIS — Z79899 Other long term (current) drug therapy: Secondary | ICD-10-CM | POA: Diagnosis not present

## 2023-09-09 DIAGNOSIS — K219 Gastro-esophageal reflux disease without esophagitis: Secondary | ICD-10-CM | POA: Diagnosis present

## 2023-09-09 DIAGNOSIS — I213 ST elevation (STEMI) myocardial infarction of unspecified site: Secondary | ICD-10-CM | POA: Diagnosis present

## 2023-09-09 DIAGNOSIS — I462 Cardiac arrest due to underlying cardiac condition: Secondary | ICD-10-CM | POA: Diagnosis present

## 2023-09-09 DIAGNOSIS — E669 Obesity, unspecified: Secondary | ICD-10-CM | POA: Diagnosis present

## 2023-09-09 DIAGNOSIS — Z7901 Long term (current) use of anticoagulants: Secondary | ICD-10-CM | POA: Diagnosis not present

## 2023-09-09 DIAGNOSIS — I2119 ST elevation (STEMI) myocardial infarction involving other coronary artery of inferior wall: Principal | ICD-10-CM | POA: Diagnosis present

## 2023-09-09 DIAGNOSIS — Y840 Cardiac catheterization as the cause of abnormal reaction of the patient, or of later complication, without mention of misadventure at the time of the procedure: Secondary | ICD-10-CM | POA: Diagnosis present

## 2023-09-09 DIAGNOSIS — E1165 Type 2 diabetes mellitus with hyperglycemia: Secondary | ICD-10-CM | POA: Diagnosis present

## 2023-09-09 DIAGNOSIS — I4901 Ventricular fibrillation: Secondary | ICD-10-CM | POA: Diagnosis present

## 2023-09-09 DIAGNOSIS — Z888 Allergy status to other drugs, medicaments and biological substances status: Secondary | ICD-10-CM | POA: Diagnosis not present

## 2023-09-09 DIAGNOSIS — I2510 Atherosclerotic heart disease of native coronary artery without angina pectoris: Secondary | ICD-10-CM | POA: Diagnosis present

## 2023-09-09 DIAGNOSIS — I959 Hypotension, unspecified: Secondary | ICD-10-CM | POA: Diagnosis not present

## 2023-09-09 DIAGNOSIS — I97710 Intraoperative cardiac arrest during cardiac surgery: Secondary | ICD-10-CM | POA: Diagnosis present

## 2023-09-09 DIAGNOSIS — E876 Hypokalemia: Secondary | ICD-10-CM | POA: Diagnosis present

## 2023-09-09 DIAGNOSIS — Z7982 Long term (current) use of aspirin: Secondary | ICD-10-CM

## 2023-09-09 DIAGNOSIS — Z791 Long term (current) use of non-steroidal anti-inflammatories (NSAID): Secondary | ICD-10-CM | POA: Diagnosis not present

## 2023-09-09 DIAGNOSIS — I10 Essential (primary) hypertension: Secondary | ICD-10-CM | POA: Diagnosis present

## 2023-09-09 HISTORY — PX: LEFT HEART CATH AND CORONARY ANGIOGRAPHY: CATH118249

## 2023-09-09 HISTORY — PX: CORONARY/GRAFT ACUTE MI REVASCULARIZATION: CATH118305

## 2023-09-09 LAB — TROPONIN I (HIGH SENSITIVITY)
Troponin I (High Sensitivity): 9 ng/L (ref <18)
Troponin I (High Sensitivity): 1432 ng/L (ref <18)
Troponin I (High Sensitivity): 7337 ng/L (ref <18)
Troponin I (High Sensitivity): 17179 ng/L (ref <18)

## 2023-09-09 LAB — LIPID PANEL
Cholesterol: 237 mg/dL — ABNORMAL HIGH (ref 0–200)
HDL: 48 mg/dL (ref >40)
LDL Cholesterol: 146 mg/dL — ABNORMAL HIGH (ref 0–99)
Total CHOL/HDL Ratio: 4.9 {ratio}
Triglycerides: 215 mg/dL — ABNORMAL HIGH (ref <150)
VLDL: 43 mg/dL — ABNORMAL HIGH (ref 0–40)

## 2023-09-09 LAB — CBC WITH DIFFERENTIAL/PLATELET
Abs Immature Granulocytes: 0.09 10*3/uL — ABNORMAL HIGH (ref 0.00–0.07)
Basophils Absolute: 0 10*3/uL (ref 0.0–0.1)
Basophils Relative: 0 %
Eosinophils Absolute: 0.1 10*3/uL (ref 0.0–0.5)
Eosinophils Relative: 1 %
HCT: 42.6 % (ref 39.0–52.0)
Hemoglobin: 14.5 g/dL (ref 13.0–17.0)
Immature Granulocytes: 1 %
Lymphocytes Relative: 31 %
Lymphs Abs: 3 10*3/uL (ref 0.7–4.0)
MCH: 27.2 pg (ref 26.0–34.0)
MCHC: 34 g/dL (ref 30.0–36.0)
MCV: 79.8 fL — ABNORMAL LOW (ref 80.0–100.0)
Monocytes Absolute: 0.9 10*3/uL (ref 0.1–1.0)
Monocytes Relative: 9 %
Neutro Abs: 5.6 10*3/uL (ref 1.7–7.7)
Neutrophils Relative %: 58 %
Platelets: 218 10*3/uL (ref 150–400)
RBC: 5.34 MIL/uL (ref 4.22–5.81)
RDW: 12.2 % (ref 11.5–15.5)
WBC: 9.6 10*3/uL (ref 4.0–10.5)
nRBC: 0 % (ref 0.0–0.2)

## 2023-09-09 LAB — PROTIME-INR
INR: 1.1 (ref 0.8–1.2)
Prothrombin Time: 14.3 s (ref 11.4–15.2)

## 2023-09-09 LAB — APTT: aPTT: 22 s — ABNORMAL LOW (ref 24–36)

## 2023-09-09 LAB — POTASSIUM: Potassium: 3.9 mmol/L (ref 3.5–5.1)

## 2023-09-09 LAB — PHOSPHORUS: Phosphorus: 1.2 mg/dL — ABNORMAL LOW (ref 2.5–4.6)

## 2023-09-09 LAB — POCT ACTIVATED CLOTTING TIME
Activated Clotting Time: 337 s
Activated Clotting Time: 297 s

## 2023-09-09 LAB — COMPREHENSIVE METABOLIC PANEL
ALT: 17 U/L (ref 0–44)
AST: 20 U/L (ref 15–41)
Albumin: 4.2 g/dL (ref 3.5–5.0)
Alkaline Phosphatase: 51 U/L (ref 38–126)
Anion gap: 14 (ref 5–15)
BUN: 15 mg/dL (ref 6–20)
CO2: 25 mmol/L (ref 22–32)
Calcium: 9.5 mg/dL (ref 8.9–10.3)
Chloride: 97 mmol/L — ABNORMAL LOW (ref 98–111)
Creatinine, Ser: 1.2 mg/dL (ref 0.61–1.24)
GFR, Estimated: >60 mL/min (ref >60)
Glucose, Bld: 201 mg/dL — ABNORMAL HIGH (ref 70–99)
Potassium: 2.7 mmol/L — CL (ref 3.5–5.1)
Sodium: 136 mmol/L (ref 135–145)
Total Bilirubin: 1.1 mg/dL (ref <1.2)
Total Protein: 7 g/dL (ref 6.5–8.1)

## 2023-09-09 LAB — MAGNESIUM: Magnesium: 1 mg/dL — ABNORMAL LOW (ref 1.7–2.4)

## 2023-09-09 LAB — CG4 I-STAT (LACTIC ACID): Lactic Acid, Venous: 2.1 mmol/L (ref 0.5–1.9)

## 2023-09-09 LAB — PLATELET COUNT: Platelets: 197 10*3/uL (ref 150–400)

## 2023-09-09 LAB — CARDIAC CATHETERIZATION: Cath EF Quantitative: 60 %

## 2023-09-09 LAB — MRSA NEXT GEN BY PCR, NASAL: MRSA by PCR Next Gen: NOT DETECTED

## 2023-09-09 SURGERY — CORONARY/GRAFT ACUTE MI REVASCULARIZATION
Anesthesia: Moderate Sedation

## 2023-09-09 MED ORDER — FENTANYL CITRATE (PF) 100 MCG/2ML IJ SOLN
INTRAMUSCULAR | Status: AC
  Filled 2023-09-09: qty 2

## 2023-09-09 MED ORDER — PANTOPRAZOLE SODIUM 40 MG IV SOLR
40.0000 mg | Freq: Two times a day (BID) | INTRAVENOUS | Status: DC
Start: 2023-09-09 — End: 2023-09-13
  Administered 2023-09-09 – 2023-09-13 (×8): 40 mg via INTRAVENOUS
  Filled 2023-09-09 (×8): qty 10

## 2023-09-09 MED ORDER — ONDANSETRON HCL 4 MG PO TABS: 4.0000 mg | ORAL_TABLET | Freq: Four times a day (QID) | ORAL | Status: DC | PRN

## 2023-09-09 MED ORDER — METOPROLOL SUCCINATE ER 25 MG PO TB24
12.5000 mg | ORAL_TABLET | Freq: Every day | ORAL | Status: DC
  Administered 2023-09-10: 12.5 mg via ORAL
  Filled 2023-09-09: qty 0.5

## 2023-09-09 MED ORDER — AMIODARONE HCL 150 MG/3ML IV SOLN
INTRAVENOUS | Status: DC | PRN
  Administered 2023-09-09: 150 mg via INTRAVENOUS

## 2023-09-09 MED ORDER — NOREPINEPHRINE 4 MG/250ML-% IV SOLN: 2.0000 ug/min | INTRAVENOUS | Status: DC

## 2023-09-09 MED ORDER — NOREPINEPHRINE 4 MG/250ML-% IV SOLN: 0.0000 ug/min | INTRAVENOUS | Status: DC

## 2023-09-09 MED ORDER — MIDAZOLAM HCL 2 MG/2ML IJ SOLN
INTRAMUSCULAR | Status: AC
  Filled 2023-09-09: qty 2

## 2023-09-09 MED ORDER — IOHEXOL 300 MG/ML  SOLN
INTRAMUSCULAR | Status: DC | PRN
  Administered 2023-09-09: 280 mL

## 2023-09-09 MED ORDER — ACETAMINOPHEN 325 MG PO TABS
650.0000 mg | ORAL_TABLET | Freq: Four times a day (QID) | ORAL | Status: DC | PRN
  Administered 2023-09-09: 650 mg via ORAL
  Filled 2023-09-09 (×3): qty 2

## 2023-09-09 MED ORDER — ASPIRIN 81 MG PO CHEW
81.0000 mg | CHEWABLE_TABLET | Freq: Every day | ORAL | Status: DC
  Administered 2023-09-10 – 2023-09-13 (×4): 81 mg via ORAL
  Filled 2023-09-09 (×4): qty 1

## 2023-09-09 MED ORDER — MIDAZOLAM HCL 2 MG/2ML IJ SOLN
INTRAMUSCULAR | Status: DC | PRN
  Administered 2023-09-09: 1 mg via INTRAVENOUS

## 2023-09-09 MED ORDER — POTASSIUM CHLORIDE 10 MEQ/100ML IV SOLN
10.0000 meq | INTRAVENOUS | Status: AC
  Administered 2023-09-09 (×3): 10 meq via INTRAVENOUS
  Filled 2023-09-09 (×3): qty 100

## 2023-09-09 MED ORDER — TIROFIBAN (AGGRASTAT) BOLUS VIA INFUSION
INTRAVENOUS | Status: DC | PRN
  Administered 2023-09-09: 2007.5 ug via INTRAVENOUS

## 2023-09-09 MED ORDER — LIDOCAINE HCL 1 % IJ SOLN
INTRAMUSCULAR | Status: AC
  Filled 2023-09-09: qty 20

## 2023-09-09 MED ORDER — ASPIRIN 81 MG PO CHEW
324.0000 mg | CHEWABLE_TABLET | Freq: Once | ORAL | Status: DC
  Filled 2023-09-09: qty 4

## 2023-09-09 MED ORDER — VERAPAMIL HCL 2.5 MG/ML IV SOLN
INTRAVENOUS | Status: DC | PRN
  Administered 2023-09-09: 2.5 mg via INTRA_ARTERIAL

## 2023-09-09 MED ORDER — TIROFIBAN HCL IV 12.5 MG/250 ML
INTRAVENOUS | Status: AC
  Filled 2023-09-09: qty 250

## 2023-09-09 MED ORDER — LIDOCAINE HCL (PF) 1 % IJ SOLN
INTRAMUSCULAR | Status: DC | PRN
  Administered 2023-09-09: 2 mL

## 2023-09-09 MED ORDER — TIROFIBAN HCL IV 12.5 MG/250 ML
INTRAVENOUS | Status: AC | PRN
  Administered 2023-09-09: .15 ug/kg/min via INTRAVENOUS

## 2023-09-09 MED ORDER — POTASSIUM CHLORIDE CRYS ER 20 MEQ PO TBCR
40.0000 meq | EXTENDED_RELEASE_TABLET | Freq: Two times a day (BID) | ORAL | Status: AC
  Administered 2023-09-09 – 2023-09-10 (×4): 40 meq via ORAL
  Filled 2023-09-09 (×4): qty 2

## 2023-09-09 MED ORDER — VERAPAMIL HCL 2.5 MG/ML IV SOLN
INTRAVENOUS | Status: AC
  Filled 2023-09-09: qty 2

## 2023-09-09 MED ORDER — NOREPINEPHRINE BITARTRATE 1 MG/ML IV SOLN
INTRAVENOUS | Status: AC | PRN
  Administered 2023-09-09: 10 ug/min via INTRAVENOUS

## 2023-09-09 MED ORDER — ONDANSETRON HCL 4 MG/2ML IJ SOLN
4.0000 mg | Freq: Four times a day (QID) | INTRAMUSCULAR | Status: DC | PRN
  Administered 2023-09-10 (×2): 4 mg via INTRAVENOUS
  Filled 2023-09-09 (×2): qty 2

## 2023-09-09 MED ORDER — SODIUM CHLORIDE 0.9 % IV SOLN
INTRAVENOUS | Status: AC
  Administered 2023-09-09: 10 mL via INTRAVENOUS

## 2023-09-09 MED ORDER — POTASSIUM CHLORIDE CRYS ER 10 MEQ PO TBCR
EXTENDED_RELEASE_TABLET | ORAL | Status: DC | PRN
  Administered 2023-09-09: 40 meq via ORAL

## 2023-09-09 MED ORDER — HEPARIN (PORCINE) IN NACL 1000-0.9 UT/500ML-% IV SOLN
INTRAVENOUS | Status: AC
  Filled 2023-09-09: qty 1000

## 2023-09-09 MED ORDER — ORAL CARE MOUTH RINSE: 15.0000 mL | OROMUCOSAL | Status: DC | PRN

## 2023-09-09 MED ORDER — ALUM & MAG HYDROXIDE-SIMETH 200-200-20 MG/5ML PO SUSP
30.0000 mL | Freq: Three times a day (TID) | ORAL | Status: DC | PRN
  Administered 2023-09-09 – 2023-09-10 (×3): 30 mL via ORAL
  Filled 2023-09-09 (×4): qty 30

## 2023-09-09 MED ORDER — POTASSIUM CHLORIDE CRYS ER 20 MEQ PO TBCR
EXTENDED_RELEASE_TABLET | ORAL | Status: AC
  Filled 2023-09-09: qty 2

## 2023-09-09 MED ORDER — TICAGRELOR 90 MG PO TABS
90.0000 mg | ORAL_TABLET | Freq: Two times a day (BID) | ORAL | Status: DC
  Administered 2023-09-09 – 2023-09-11 (×4): 90 mg via ORAL
  Filled 2023-09-09 (×4): qty 1

## 2023-09-09 MED ORDER — AMIODARONE HCL IN DEXTROSE 360-4.14 MG/200ML-% IV SOLN
INTRAVENOUS | Status: AC
Start: 2023-09-09 — End: ?
  Filled 2023-09-09: qty 200

## 2023-09-09 MED ORDER — HEPARIN SODIUM (PORCINE) 5000 UNIT/ML IJ SOLN
4000.0000 [IU] | Freq: Once | INTRAMUSCULAR | Status: AC
  Administered 2023-09-09: 4000 [IU] via INTRAVENOUS

## 2023-09-09 MED ORDER — SODIUM CHLORIDE 0.9 % IV SOLN
INTRAVENOUS | Status: AC | PRN
  Administered 2023-09-09: 500 mL via INTRAVENOUS

## 2023-09-09 MED ORDER — TIROFIBAN HCL IN NACL 5-0.9 MG/100ML-% IV SOLN
0.1500 ug/kg/min | INTRAVENOUS | Status: AC
  Administered 2023-09-09 (×2): 0.15 ug/kg/min via INTRAVENOUS
  Filled 2023-09-09 (×3): qty 100

## 2023-09-09 MED ORDER — TICAGRELOR 90 MG PO TABS
180.0000 mg | ORAL_TABLET | Freq: Once | ORAL | Status: AC
  Administered 2023-09-09: 180 mg via ORAL

## 2023-09-09 MED ORDER — HEPARIN SODIUM (PORCINE) 1000 UNIT/ML IJ SOLN
INTRAMUSCULAR | Status: AC
Start: 2023-09-09 — End: ?
  Filled 2023-09-09: qty 10

## 2023-09-09 MED ORDER — NOREPINEPHRINE BITARTRATE 1 MG/ML IV SOLN
INTRAVENOUS | Status: AC
  Filled 2023-09-09: qty 4

## 2023-09-09 MED ORDER — AMIODARONE HCL IN DEXTROSE 360-4.14 MG/200ML-% IV SOLN
60.0000 mg/h | INTRAVENOUS | Status: DC
  Administered 2023-09-09 (×2): 60 mg/h via INTRAVENOUS
  Filled 2023-09-09: qty 200

## 2023-09-09 MED ORDER — HEPARIN SODIUM (PORCINE) 1000 UNIT/ML IJ SOLN
INTRAMUSCULAR | Status: DC | PRN
  Administered 2023-09-09: 8000 [IU] via INTRAVENOUS

## 2023-09-09 MED ORDER — ACETAMINOPHEN 650 MG RE SUPP: 650.0000 mg | Freq: Four times a day (QID) | RECTAL | Status: DC | PRN

## 2023-09-09 MED ORDER — AMIODARONE LOAD VIA INFUSION
150.0000 mg | Freq: Once | INTRAVENOUS | Status: DC
  Filled 2023-09-09: qty 83.34

## 2023-09-09 MED ORDER — AMIODARONE HCL IN DEXTROSE 360-4.14 MG/200ML-% IV SOLN
30.0000 mg/h | INTRAVENOUS | Status: DC
  Administered 2023-09-09 (×2): 30 mg/h via INTRAVENOUS
  Filled 2023-09-09: qty 200

## 2023-09-09 MED ORDER — AMIODARONE HCL 150 MG/3ML IV SOLN
INTRAVENOUS | Status: AC
  Filled 2023-09-09: qty 3

## 2023-09-09 MED ORDER — ALUM & MAG HYDROXIDE-SIMETH 200-200-20 MG/5ML PO SUSP: 30.0000 mL | ORAL | Status: DC

## 2023-09-09 MED ORDER — ROSUVASTATIN CALCIUM 10 MG PO TABS
40.0000 mg | ORAL_TABLET | Freq: Every day | ORAL | Status: DC
  Administered 2023-09-10 – 2023-09-13 (×4): 40 mg via ORAL
  Filled 2023-09-09 (×4): qty 4

## 2023-09-09 MED ORDER — PANTOPRAZOLE SODIUM 40 MG IV SOLR: 40.0000 mg | Freq: Every day | INTRAVENOUS | Status: DC

## 2023-09-09 MED ORDER — SODIUM PHOSPHATES 45 MMOLE/15ML IV SOLN
30.0000 mmol | Freq: Once | INTRAVENOUS | Status: AC
  Administered 2023-09-09: 30 mmol via INTRAVENOUS
  Filled 2023-09-09: qty 10

## 2023-09-09 MED ORDER — HEPARIN (PORCINE) IN NACL 1000-0.9 UT/500ML-% IV SOLN
INTRAVENOUS | Status: DC | PRN
  Administered 2023-09-09 (×2): 500 mL

## 2023-09-09 MED ORDER — MAGNESIUM SULFATE 4 GM/100ML IV SOLN
4.0000 g | Freq: Once | INTRAVENOUS | Status: AC
  Administered 2023-09-09: 4 g via INTRAVENOUS
  Filled 2023-09-09: qty 100

## 2023-09-09 MED ORDER — FENTANYL CITRATE (PF) 100 MCG/2ML IJ SOLN
INTRAMUSCULAR | Status: DC | PRN
  Administered 2023-09-09 (×2): 25 ug via INTRAVENOUS

## 2023-09-09 MED ORDER — SODIUM CHLORIDE 0.9 % WEIGHT BASED INFUSION
1.0000 mL/kg/h | INTRAVENOUS | Status: AC
  Administered 2023-09-09 (×2): 1 mL/kg/h via INTRAVENOUS

## 2023-09-09 MED ORDER — ENOXAPARIN SODIUM 40 MG/0.4ML IJ SOSY
40.0000 mg | PREFILLED_SYRINGE | INTRAMUSCULAR | Status: DC
  Administered 2023-09-10: 40 mg via SUBCUTANEOUS
  Filled 2023-09-09: qty 0.4

## 2023-09-09 MED ORDER — ATORVASTATIN CALCIUM 20 MG PO TABS: 80.0000 mg | ORAL_TABLET | Freq: Every day | ORAL | Status: DC

## 2023-09-09 SURGICAL SUPPLY — 20 items
BALLN TREK RX 2.5X15 (BALLOONS) ×1
BALLOON TREK RX 2.5X15 (BALLOONS) IMPLANT
CANISTER PENUMBRA ENGINE (MISCELLANEOUS) IMPLANT
CATH 5FR JL3.5 JR4 ANG PIG MP (CATHETERS) IMPLANT
CATH INDIGO CAT RX KIT (CATHETERS) IMPLANT
CATH VISTA GUIDE 6FR JR4 (CATHETERS) IMPLANT
DEVICE RAD TR BAND REGULAR (VASCULAR PRODUCTS) IMPLANT
DRAPE BRACHIAL (DRAPES) IMPLANT
GLIDESHEATH SLEND SS 6F .021 (SHEATH) IMPLANT
GUIDEWIRE INQWIRE 1.5J.035X260 (WIRE) IMPLANT
INQWIRE 1.5J .035X260CM (WIRE) ×1
KIT ENCORE 26 ADVANTAGE (KITS) IMPLANT
KIT SYRINGE INJ CVI SPIKEX1 (MISCELLANEOUS) IMPLANT
PACK CARDIAC CATH (CUSTOM PROCEDURE TRAY) ×1 IMPLANT
PROTECTION STATION PRESSURIZED (MISCELLANEOUS) ×1
SET ATX-X65L (MISCELLANEOUS) IMPLANT
STATION PROTECTION PRESSURIZED (MISCELLANEOUS) IMPLANT
STENT ONYX FRONTIER 4.0X15 (Permanent Stent) IMPLANT
TUBING CIL FLEX 10 FLL-RA (TUBING) IMPLANT
WIRE G HI TQ BMW 190 (WIRE) IMPLANT

## 2023-09-09 NOTE — Consult Note (Signed)
CARDIOLOGY CONSULT NOTE               Patient ID: Brent Oneill MRN: [id number] DOB/AGE: 1970-10-02 52 y.o.  Admit date: 09/09/2023 Referring Physician Dr. Westley Hummer emergency room Primary Physician Dr. Paulla Dolly at [facility] Cardiologist  Reason for Consultation inferior STEMI  HPI: Patient is a 52 year old male originally from [place] history of hypertension hyperlipidemia mild obesity mild asthma presented with acute onset of substernal chest pain while seated at his computer the pain became worse so they called EMS he was brought in via rescue after about 20 minutes in the field.  In the emergency room he was given heparin aspirin as well as Brilinta he was hemodynamically stable slightly hypotensive with 7 out of 10 chest pain.  EKG had evidence of ST elevation inferiorly suggestive of STEMI after evaluation emergency room resected we elected to bring him to the cardiac Cath Lab for emergent diagnostic cardiac cath possible PCI and stent  Review of systems complete and found to be negative unless listed above     Past Medical History:  Diagnosis Date   Allergic rhinitis 03/14/2016   Asthma, persistent 03/14/2016    Past Surgical History:  Procedure Laterality Date   NO PAST SURGERIES      Medications Prior to Admission  Medication Sig Dispense Refill Last Dose/Taking   BELSOMRA 10 MG TABS Take 1 tablet by mouth at bedtime.      budesonide (PULMICORT) 0.5 MG/2ML nebulizer solution Take by nebulization. (Patient not taking: Reported on 08/16/2023)      chlorthalidone (HYGROTON) 25 MG tablet Take 0.5 tablets (12.5 mg total) by mouth daily. 45 tablet 0    cyclobenzaprine (FLEXERIL) 10 MG tablet Take 1 tablet (10 mg total) by mouth 3 (three) times daily as needed for muscle spasms. 30 tablet 0    fluticasone (FLONASE) 50 MCG/ACT nasal spray SPRAY 2 SPRAYS INTO EACH NOSTRIL EVERY DAY 48 mL 3    fluticasone furoate-vilanterol (BREO ELLIPTA) 200-25 MCG/ACT AEPB  INHALE 1 PUFF BY MOUTH ONCE DAILY * NEEDS APPT AND LABS 30 each 0    ibuprofen (ADVIL) 800 MG tablet Take 1 tablet (800 mg total) by mouth every 8 (eight) hours as needed. 30 tablet 0    meloxicam (MOBIC) 15 MG tablet Take 1 tablet (15 mg total) by mouth daily. 30 tablet 2    Phendimetrazine Tartrate 35 MG TABS TAKE 1 TABLET BY MOUTH THREE TIMES A DAY (Patient not taking: Reported on 08/16/2023) 90 tablet 0    tamsulosin (FLOMAX) 0.4 MG CAPS capsule Take 1 capsule (0.4 mg total) by mouth daily. 90 capsule 3    Social History   Socioeconomic History   Marital status: Married    Spouse name: Not on file   Number of children: Not on file   Years of education: Not on file   Highest education level: Not on file  Occupational History   Not on file  Tobacco Use   Smoking status: Never   Smokeless tobacco: Never  Substance and Sexual Activity   Alcohol use: Yes    Alcohol/week: 0.0 standard drinks of alcohol   Drug use: No   Sexual activity: Not on file  Other Topics Concern   Not on file  Social History Narrative   Not on file   Social Drivers of Health   Financial Resource Strain: Not on file  Food Insecurity: Not on file  Transportation Needs: Not on file  Physical Activity: Not on  file  Stress: Not on file  Social Connections: Not on file  Intimate Partner Violence: Not on file    Family History  Problem Relation Age of Onset   Kidney cancer Neg Hx    Prostate cancer Neg Hx       Review of systems complete and found to be negative unless listed above      PHYSICAL EXAM  General: Well developed, well nourished, in no acute distress HEENT:  Normocephalic and atramatic Neck:  No JVD.  Lungs: Clear bilaterally to auscultation and percussion. Heart: HRRR . Normal S1 and S2 without gallops or murmurs.  Abdomen: Bowel sounds are positive, abdomen soft and non-tender  Msk:  Back normal, normal gait. Normal strength and tone for age. Extremities: No clubbing, cyanosis  or edema.   Neuro: Alert and oriented X 3. Psych:  Good affect, responds appropriately  Labs:   Lab Results  Component Value Date   WBC 9.6 09/09/2023   HGB 14.5 09/09/2023   HCT 42.6 09/09/2023   MCV 79.8 (L) 09/09/2023   PLT 218 09/09/2023    Recent Labs  Lab 09/09/23 1101  NA 136  K 2.7*  CL 97*  CO2 25  BUN 15  CREATININE 1.20  CALCIUM 9.5  PROT 7.0  BILITOT 1.1  ALKPHOS 51  ALT 17  AST 20  GLUCOSE 201*   No results found for: "CKTOTAL", "CKMB", "CKMBINDEX", "TROPONINI"  Lab Results  Component Value Date   CHOL 237 (H) 09/09/2023   CHOL 194 08/11/2023   CHOL 191 04/21/2023   Lab Results  Component Value Date   HDL 48 09/09/2023   HDL 55 08/11/2023   HDL 45 04/21/2023   Lab Results  Component Value Date   LDLCALC 146 (H) 09/09/2023   LDLCALC 110 (H) 08/11/2023   LDLCALC 120 (H) 04/21/2023   Lab Results  Component Value Date   TRIG 215 (H) 09/09/2023   TRIG 166 (H) 08/11/2023   TRIG 145 04/21/2023   Lab Results  Component Value Date   CHOLHDL 4.9 09/09/2023   CHOLHDL 3.5 08/11/2023   No results found for: "LDLDIRECT"    Radiology: No results found.  EKG: Normal sinus rhythm ST elevation inferiorly no significant reciprocal depression LVH by voltage nonspecific ST-T wave changes rate of around 90  ASSESSMENT AND PLAN:  STEMI inferiorly Hypertension Hyperlipidemia Hypokalemia Hypomagnesemia Borderline obesity Hypotension V-fib arrest during PCI . 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 Correct electrolytes magnesium potassium Wean Levophed to off as long as blood pressure stays 90-100 systolic Recommend Protonix therapy  for reflux type symptoms as well as Mylanta Discontinue chlorthalidone and switch to beta-blocker and/or ARB Continue fluid hydration Echocardiogram for left ventricular function wall motion and valvular structures Consider repeat cardiac cath prior to discharge   Signed: 09/09/2023, 2:41 PM

## 2023-09-09 NOTE — ED Provider Notes (Signed)
[facility] Provider Note    Event Date/Time   First MD Initiated Contact with Patient 09/09/23 1056     (approximate)   History   Code STEMI   HPI  Brent Oneill is a 52 y.o. male with a history of hypertension and hyperlipidemia who presents with chest pain, acute onset around 9:40 AM while he was working on his computer, nonexertional, constant since then, and mainly located in his central chest.  It is pressure-like in quality.  He reports some associated shortness of breath.  He states his symptoms have improved after medications given by EMS.  EMS reports that they gave fentanyl, nitro spray, applied Nitropaste, and gave 324 of aspirin.  Patient states he has no prior cardiac history.  I reviewed the past medical records.  The patient's most recent outpatient counter was on 12/3 with internal medicine for lower back pain.  He was started on steroids, ibuprofen, and Flexeril at that time.   Physical Exam   Triage Vital Signs: ED Triage Vitals  Encounter Vitals Group     BP 09/09/23 1057 113/68     Systolic BP Percentile --      Diastolic BP Percentile --      Pulse Rate 09/09/23 1057 86     Resp 09/09/23 1100 15     Temp --      Temp src --      SpO2 09/09/23 1057 98 %     Weight --      Height --      Head Circumference --      Peak Flow --      Pain Score --      Pain Loc --      Pain Education --      Exclude from Growth Chart --     Most recent vital signs: Vitals:   09/09/23 1100 09/09/23 1115  BP:    Pulse: 79   Resp: 15   SpO2: 99% 99%     General: Awake, uncomfortable appearing, no distress.  CV:  Good peripheral perfusion.  Normal heart sounds. Resp:  Normal effort.  Lungs CTAB. Abd:  No distention.  Other:  No peripheral edema.   ED Results / Procedures / Treatments   Labs (all labs ordered are listed, but only abnormal results are displayed) Labs Reviewed  CBC WITH DIFFERENTIAL/PLATELET - Abnormal; Notable for  the following components:      Result Value   MCV 79.8 (*)    Abs Immature Granulocytes 0.09 (*)    All other components within normal limits  APTT - Abnormal; Notable for the following components:   aPTT 22 (*)    All other components within normal limits  COMPREHENSIVE METABOLIC PANEL - Abnormal; Notable for the following components:   Potassium 2.7 (*)    Chloride 97 (*)    Glucose, Bld 201 (*)    All other components within normal limits  PROTIME-INR  LIPID PANEL  I-STAT CG4 LACTIC ACID, ED  TROPONIN I (HIGH SENSITIVITY)     EKG  ED ECG REPORT I, Dionne Bucy, the attending physician, personally viewed and interpreted this ECG.  Date: 09/09/2023 EKG Time: 1059 Rate: 78 Rhythm: normal sinus rhythm QRS Axis: normal Intervals: normal ST/T Wave abnormalities: Acute ST elevations, inferior leads Narrative Interpretation: Acute MI    RADIOLOGY    PROCEDURES:  Critical Care performed: Yes, see critical care procedure note(s)  Procedures   MEDICATIONS ORDERED IN ED: Medications  0.9 %  sodium chloride infusion (10 mLs Intravenous New Bag/Given 09/09/23 1059)  aspirin chewable tablet 324 mg ( Oral MAR Hold 09/09/23 1114)  ticagrelor (BRILINTA) tablet 180 mg ( Oral Automatically Held 09/09/23 1115)  lidocaine (PF) (XYLOCAINE) 1 % injection (2 mLs Infiltration Given 09/09/23 1121)  Heparin (Porcine) in NaCl 1000-0.9 UT/500ML-% SOLN (500 mLs  Given 09/09/23 1121)  verapamil (ISOPTIN) injection (2.5 mg Intra-arterial Given 09/09/23 1122)  heparin sodium (porcine) injection (8,000 Units Intravenous Given 09/09/23 1126)  fentaNYL (SUBLIMAZE) injection (25 mcg Intravenous Given 09/09/23 1126)  midazolam (VERSED) injection (1 mg Intravenous Given 09/09/23 1126)  heparin injection 4,000 Units (4,000 Units Intravenous Given 09/09/23 1102)     IMPRESSION / MDM / ASSESSMENT AND PLAN / ED COURSE  I reviewed the triage vital signs and the nursing  notes.  52 year old male with PMH as noted above presents with acute onset of chest pain.  EMS EKG showed concern for ST elevations, so code STEMI was activated.  He was given nitro, aspirin, and fentanyl while en route.  On arrival to the ED, the patient is uncomfortable appearing but in no acute distress.  Vital signs are normal.  Physical exam is unremarkable for acute findings.  EKG here confirms inferior ST elevations.  Differential diagnosis includes, but is not limited to, STEMI, other ACS, demand ischemia.  Patient's presentation is most consistent with acute presentation with potential threat to life or bodily function.  The patient is on the cardiac monitor to evaluate for evidence of arrhythmia and/or significant heart rate changes.   The patient was given a heparin bolus and Brilinta.  Dr. Juliann Pares responded to the code STEMI.  I consulted and discussed case with him.  He recommended emergently taking the patient to the Cath Lab.  Lab workup is significant for potassium of 2.7 on the CMP.  CBC shows no acute findings.  Initial troponin (resulted after the patient had already left the ED) is negative.   FINAL CLINICAL IMPRESSION(S) / ED DIAGNOSES   Final diagnoses:  ST elevation myocardial infarction (STEMI), unspecified artery (HCC)     Rx / DC Orders   ED Discharge Orders     None        Note:  This document was prepared using Dragon voice recognition software and may include unintentional dictation errors.    Dionne Bucy, MD 09/09/23 1141

## 2023-09-09 NOTE — ED Triage Notes (Addendum)
Pt to ED via [facility] from home. Pt had sudden onset 9/10 CP non-radiating around 9:40am. EKG changes with 2nd EKG. STEMI called in field. No cardiac hx. Pt denies blood thinner. Pt A&Ox4 on arrival. Pt reports pain currently 6/10  EMS PTA: BP 130/80 324 mg ASA 1in nitro paste  1 spray SL nitro  18g RAC fentanyl

## 2023-09-09 NOTE — Progress Notes (Signed)
TR band remains in place due to patient with minimal amount of bleeding during deflation and continuing on aggrastat.  Right arm remains elevated at this time.

## 2023-09-09 NOTE — ED Notes (Addendum)
Pt undressed and belongings placed in bag. Placed on zoll. Repeat EKG done. 2nd IV obtained

## 2023-09-09 NOTE — H&P (Addendum)
History and Physical:    Brent Oneill   ZOX:[id number] DOB: Jan 15, 1971 DOA: 09/09/2023  Referring MD/provider: Dorothyann Peng, MD PCP: Sherron Monday, MD   Patient coming from: Home  Chief Complaint: Chest pain  History of Present Illness:   Brent Oneill is a 52 y.o. male with medical history significant for persistent asthma, hypertension, hyperlipidemia, prediabetes, who presented to the hospital because of chest pain and shortness of breath.  He was found to have acute inferior ST elevation MI.  He was transferred to the Cath Lab for emergent left heart catheterization.  He was found to have coronary artery disease in the distal RCA.  According to Dr. Juliann Pares, cardiologist, developed ventricular fibrillation arrest while on the table.  He was treated with defibrillation, IV amiodarone and was started on IV Levophed infusion.  He was also started on tirofiban infusion.  The hospitalist team was consulted to admit the patient for further management.  ED Course:  The patient was transferred to the Cath Lab emergently.  ROS:   ROS all other systems reviewed were negative  Past Medical History:   Past Medical History:  Diagnosis Date   Allergic rhinitis 03/14/2016   Asthma, persistent 03/14/2016    Past Surgical History:   Past Surgical History:  Procedure Laterality Date   NO PAST SURGERIES      Social History:   Social History   Socioeconomic History   Marital status: Married    Spouse name: Not on file   Number of children: Not on file   Years of education: Not on file   Highest education level: Not on file  Occupational History   Not on file  Tobacco Use   Smoking status: Never   Smokeless tobacco: Never  Substance and Sexual Activity   Alcohol use: Yes    Alcohol/week: 0.0 standard drinks of alcohol   Drug use: No   Sexual activity: Not on file  Other Topics Concern   Not on file  Social History Narrative   Not on file   Social Drivers of Health    Financial Resource Strain: Not on file  Food Insecurity: Not on file  Transportation Needs: Not on file  Physical Activity: Not on file  Stress: Not on file  Social Connections: Not on file  Intimate Partner Violence: Not on file    Allergies   Quinine derivatives  Family history:   Family History  Problem Relation Age of Onset   Kidney cancer Neg Hx    Prostate cancer Neg Hx     Current Medications:   Prior to Admission medications   Medication Sig Start Date End Date Taking? Authorizing Provider  BELSOMRA 10 MG TABS Take 1 tablet by mouth at bedtime. 07/03/22   [provider]  budesonide (PULMICORT) 0.5 MG/2ML nebulizer solution Take by nebulization. Patient not taking: Reported on 08/16/2023 07/13/20   [provider]  chlorthalidone (HYGROTON) 25 MG tablet Take 0.5 tablets (12.5 mg total) by mouth daily. 08/16/23   Sherron Monday, MD  cyclobenzaprine (FLEXERIL) 10 MG tablet Take 1 tablet (10 mg total) by mouth 3 (three) times daily as needed for muscle spasms. 08/29/23 11/27/23  Sherron Monday, MD  fluticasone (FLONASE) 50 MCG/ACT nasal spray SPRAY 2 SPRAYS INTO EACH NOSTRIL EVERY DAY 03/06/23   Sherron Monday, MD  fluticasone furoate-vilanterol (BREO ELLIPTA) 200-25 MCG/ACT AEPB INHALE 1 PUFF BY MOUTH ONCE DAILY * NEEDS APPT AND LABS 04/12/23   Sherron Monday, MD  ibuprofen (ADVIL) 800 MG tablet Take 1 tablet (800 mg total) by mouth every 8 (eight) hours as needed. 08/29/23   Sherron Monday, MD  meloxicam (MOBIC) 15 MG tablet Take 1 tablet (15 mg total) by mouth daily. 08/16/23   Sherron Monday, MD  Phendimetrazine Tartrate 35 MG TABS TAKE 1 TABLET BY MOUTH THREE TIMES A DAY Patient not taking: Reported on 08/16/2023 01/09/23   Orson Eva, NP  tamsulosin (FLOMAX) 0.4 MG CAPS capsule Take 1 capsule (0.4 mg total) by mouth daily. 08/25/22   Sondra Come, MD    Physical Exam:   Vitals:   09/09/23 1229 09/09/23 1234 09/09/23  1239 09/09/23 1244  BP: 124/71 133/77 127/75 126/79  Pulse: 92 99 84 82  Resp: 16 (!) 24 10   SpO2: 100%        Physical Exam: Blood pressure 126/79, pulse 82, resp. rate 10, SpO2 100%. Gen: No acute distress. Head: Normocephalic, atraumatic. Eyes: Pupils equal, round and reactive to light. Extraocular movements intact.  Sclerae nonicteric.  Mouth: Oropharynx reveals moist mucous membranes, normal dentition Neck: Supple, no thyromegaly, no lymphadenopathy, no jugular venous distention. Chest: Lungs are clear to auscultation with good air movement. No rales, rhonchi or wheezes.  CV: Heart sounds are regular with an S1, S2. No murmurs, rubs or gallops.  Abdomen: Soft, nontender, nondistended with normal active bowel sounds. No palpable masses. Extremities: Extremities are without clubbing, or cyanosis. No edema. Pedal pulses 2+.  Skin: Warm and dry. No rashes, lesions or wounds Neuro: Alert and oriented times 3; grossly nonfocal.  Psych: Insight is good and judgment is appropriate. Mood and affect normal.   Data Review:    Labs: Basic Metabolic Panel: Recent Labs  Lab 09/09/23 1101  NA 136  K 2.7*  CL 97*  CO2 25  GLUCOSE 201*  BUN 15  CREATININE 1.20  CALCIUM 9.5   Liver Function Tests: Recent Labs  Lab 09/09/23 1101  AST 20  ALT 17  ALKPHOS 51  BILITOT 1.1  PROT 7.0  ALBUMIN 4.2   No results for input(s): "LIPASE", "AMYLASE" in the last 168 hours. No results for input(s): "AMMONIA" in the last 168 hours. CBC: Recent Labs  Lab 09/09/23 1101  WBC 9.6  NEUTROABS 5.6  HGB 14.5  HCT 42.6  MCV 79.8*  PLT 218   Cardiac Enzymes: No results for input(s): "CKTOTAL", "CKMB", "CKMBINDEX", "TROPONINI" in the last 168 hours.  BNP (last 3 results) No results for input(s): "PROBNP" in the last 8760 hours. CBG: No results for input(s): "GLUCAP" in the last 168 hours.  Urinalysis    Component Value Date/Time   APPEARANCEUR Clear 04/27/2016 0846   GLUCOSEU  Negative 04/27/2016 0846   BILIRUBINUR neg 08/29/2023 1535   BILIRUBINUR Negative 04/27/2016 0846   PROTEINUR Negative 08/29/2023 1535   PROTEINUR Negative 04/27/2016 0846   UROBILINOGEN 0.2 08/29/2023 1535   NITRITE neg 08/29/2023 1535   NITRITE Negative 04/27/2016 0846   LEUKOCYTESUR Negative 08/29/2023 1535   LEUKOCYTESUR Negative 04/27/2016 0846      Radiographic Studies: No results found.  EKG: Independently reviewed by me showed normal sinus rhythm, ST elevation in inferior leads.    Assessment/Plan:   Principal Problem:   STEMI (ST elevation myocardial infarction) (HCC)    Inferior STEMI: S/p left heart cath PCI to distal RCA.  Continue aspirin and Brilinta.  He is on IV tirofiban infusion. 2D echo report is pending.   S/p ventricular fibrillation cardiac arrest during left  heart cath: S/p defibrillation.  Continue IV amiodarone infusion and IV Levophed infusion.   Hypokalemia: Potassium 2.7.  Replete with IV and oral potassium chloride. Hypomagnesemia: Magnesium 1.0.  Replete with IV magnesium sulfate. Hypophosphatemia: Phosphorus 1.2.  Replete with IV sodium phosphate.   Hyperlipidemia: Total cholesterol 237, LDL 146, triglycerides 215, HDL 48.  He said he had been prescribed rosuvastatin as an outpatient but he was not taking it regularly.  Start rosuvastatin 40 mg daily.   Hyperglycemia: Hemoglobin A1c of 6.5 on 08/11/2023.  Repeat hemoglobin A1c.   History of hypertension: Hold antihypertensives.   History of moderate persistent asthma: Compensated.  Continue bronchodilators.   CRITICAL CARE Performed by: Lurene Shadow   Total critical care time: 45 minutes  Critical care time was exclusive of separately billable procedures and treating other patients.  Critical care was necessary to treat or prevent imminent or life-threatening deterioration.  Critical care was time spent personally by me on the following activities: development of treatment  plan with patient and/or surrogate as well as nursing, discussions with consultants, evaluation of patient's response to treatment, examination of patient, obtaining history from patient or surrogate, ordering and performing treatments and interventions, ordering and review of laboratory studies, ordering and review of radiographic studies, pulse oximetry and re-evaluation of patient's condition. care    Other information:   DVT prophylaxis: He is on Aggrastat  Code Status: Full code. Family Communication: Plan discussed with Ezekiel, son, at the bedside Disposition Plan: Plan to discharge home Consults called: Cardiologist Admission status: Inpatient       Twila Rappa [facility] Pager: Please check [email or web address]   How to contact the [facility] Attending or Consulting provider 7A - 7P or covering provider during after hours 7P -7A, for this patient?   Check the care team in [facility] and look for a) attending/consulting [facility] provider listed and b) the [facility] team listed Log into [email or web address] and use [facility] universal password to access. If you do not have the password, please contact the hospital operator. Locate the [facility] provider you are looking for under [facility] and page to a number that you can be directly reached. If you still have difficulty reaching the provider, please page the [facility] (Director on Call) for the Hospitalists listed on amion for assistance.  09/09/2023, 2:18 PM

## 2023-09-09 NOTE — ED Notes (Addendum)
180 mg brilinta given per cardiologist request.

## 2023-09-09 NOTE — Plan of Care (Signed)

## 2023-09-09 NOTE — Progress Notes (Signed)
   09/09/23 1100  Spiritual Encounters  Type of Visit Initial  Care provided to: Pt and family  Referral source Code page  Reason for visit Code  OnCall Visit Yes   Chaplain responded to Code STEMI. Patient receiving care at bedside. Chaplain received family in ED waiting and after escorting them to ED 1, escorted them to Cath Lab waiting area in hallway per Doctor's request.

## 2023-09-09 NOTE — Progress Notes (Signed)
   09/09/23 1400  Spiritual Encounters  Type of Visit Follow up  Care provided to: Pt and family  Referral source Other (comment)  Reason for visit Routine spiritual support  OnCall Visit Yes   Chaplain provided post-procedure support.

## 2023-09-09 NOTE — ED Notes (Addendum)
Pt taken to cath lab by this RN and cardiologist Dr. Juliann Pares. Family taken to waiting area by chaplain

## 2023-09-09 NOTE — Plan of Care (Signed)
Continuing with plan of care. 

## 2023-09-09 NOTE — ED Notes (Signed)
Cardiologist at bedside.  

## 2023-09-10 ENCOUNTER — Inpatient Hospital Stay
Admit: 2023-09-10 | Discharge: 2023-09-10 | Disposition: A | Discharge status: Home / Self Care | Payer: BC Managed Care – PPO | Attending: Internal Medicine | Admitting: Internal Medicine

## 2023-09-10 ENCOUNTER — Inpatient Hospital Stay: Payer: BC Managed Care – PPO

## 2023-09-10 DIAGNOSIS — I2111 ST elevation (STEMI) myocardial infarction involving right coronary artery: Secondary | ICD-10-CM | POA: Diagnosis not present

## 2023-09-10 LAB — ECHOCARDIOGRAM COMPLETE
AR max vel: 2.63 cm2
AV Peak grad: 8.8 mm[Hg]
Ao pk vel: 1.48 m/s
Area-P 1/2: 4.19 cm2
S' Lateral: 2.6 cm

## 2023-09-10 LAB — CBC
HCT: 35.5 % — ABNORMAL LOW (ref 39.0–52.0)
Hemoglobin: 12.5 g/dL — ABNORMAL LOW (ref 13.0–17.0)
MCH: 27.4 pg (ref 26.0–34.0)
MCHC: 35.2 g/dL (ref 30.0–36.0)
MCV: 77.7 fL — ABNORMAL LOW (ref 80.0–100.0)
Platelets: 186 10*3/uL (ref 150–400)
RBC: 4.57 MIL/uL (ref 4.22–5.81)
RDW: 12.3 % (ref 11.5–15.5)
WBC: 10.8 10*3/uL — ABNORMAL HIGH (ref 4.0–10.5)
nRBC: 0 % (ref 0.0–0.2)

## 2023-09-10 LAB — BASIC METABOLIC PANEL
Anion gap: 11 (ref 5–15)
BUN: 11 mg/dL (ref 6–20)
CO2: 25 mmol/L (ref 22–32)
Calcium: 8.7 mg/dL — ABNORMAL LOW (ref 8.9–10.3)
Chloride: 97 mmol/L — ABNORMAL LOW (ref 98–111)
Creatinine, Ser: 0.99 mg/dL (ref 0.61–1.24)
GFR, Estimated: >60 mL/min (ref >60)
Glucose, Bld: 171 mg/dL — ABNORMAL HIGH (ref 70–99)
Potassium: 3.7 mmol/L (ref 3.5–5.1)
Sodium: 133 mmol/L — ABNORMAL LOW (ref 135–145)

## 2023-09-10 LAB — HEMOGLOBIN A1C
Hgb A1c MFr Bld: 6.8 % — ABNORMAL HIGH (ref 4.8–5.6)
Mean Plasma Glucose: 148.46 mg/dL

## 2023-09-10 LAB — MAGNESIUM: Magnesium: 2.1 mg/dL (ref 1.7–2.4)

## 2023-09-10 LAB — PROTIME-INR
INR: 1.1 (ref 0.8–1.2)
Prothrombin Time: 14.7 s (ref 11.4–15.2)

## 2023-09-10 LAB — HIV ANTIBODY (ROUTINE TESTING W REFLEX): HIV Screen 4th Generation wRfx: NONREACTIVE

## 2023-09-10 LAB — TROPONIN I (HIGH SENSITIVITY)
Troponin I (High Sensitivity): 12533 ng/L (ref <18)
Troponin I (High Sensitivity): 13870 ng/L (ref <18)

## 2023-09-10 LAB — HEPARIN LEVEL (UNFRACTIONATED): Heparin Unfractionated: 0.69 [IU]/mL (ref 0.30–0.70)

## 2023-09-10 LAB — PHOSPHORUS: Phosphorus: 3.7 mg/dL (ref 2.5–4.6)

## 2023-09-10 LAB — APTT: aPTT: 75 s — ABNORMAL HIGH (ref 24–36)

## 2023-09-10 MED ORDER — FAMOTIDINE IN NACL 20-0.9 MG/50ML-% IV SOLN
20.0000 mg | Freq: Two times a day (BID) | INTRAVENOUS | Status: AC
Start: 2023-09-10 — End: 2023-09-10
  Administered 2023-09-10 (×2): 20 mg via INTRAVENOUS
  Filled 2023-09-10 (×2): qty 50

## 2023-09-10 MED ORDER — LOSARTAN POTASSIUM 50 MG PO TABS
25.0000 mg | ORAL_TABLET | Freq: Every day | ORAL | Status: DC
  Administered 2023-09-11: 25 mg via ORAL
  Filled 2023-09-10: qty 1

## 2023-09-10 MED ORDER — LIDOCAINE VISCOUS HCL 2 % MT SOLN
15.0000 mL | OROMUCOSAL | Status: DC | PRN
  Administered 2023-09-10 – 2023-09-11 (×2): 15 mL via ORAL
  Filled 2023-09-10 (×2): qty 15

## 2023-09-10 MED ORDER — CHLORHEXIDINE GLUCONATE CLOTH 2 % EX PADS
6.0000 | MEDICATED_PAD | Freq: Every day | CUTANEOUS | Status: DC
  Administered 2023-09-10 – 2023-09-12 (×3): 6 via TOPICAL

## 2023-09-10 MED ORDER — ALUM & MAG HYDROXIDE-SIMETH 200-200-20 MG/5ML PO SUSP: 30.0000 mL | ORAL | Status: DC | PRN

## 2023-09-10 MED ORDER — METOPROLOL TARTRATE 50 MG PO TABS
50.0000 mg | ORAL_TABLET | Freq: Two times a day (BID) | ORAL | Status: DC
  Administered 2023-09-10 – 2023-09-13 (×6): 50 mg via ORAL
  Filled 2023-09-10 (×6): qty 1

## 2023-09-10 MED ORDER — HEPARIN (PORCINE) 25000 UT/250ML-% IV SOLN
950.0000 [IU]/h | INTRAVENOUS | Status: DC
  Administered 2023-09-10: 950 [IU]/h via INTRAVENOUS
  Filled 2023-09-10: qty 250

## 2023-09-10 MED ORDER — IOHEXOL 300 MG/ML  SOLN
80.0000 mL | Freq: Once | INTRAMUSCULAR | Status: AC | PRN
  Administered 2023-09-10: 80 mL via INTRAVENOUS

## 2023-09-10 MED ORDER — FENTANYL CITRATE PF 50 MCG/ML IJ SOSY
50.0000 ug | PREFILLED_SYRINGE | Freq: Once | INTRAMUSCULAR | Status: AC
  Administered 2023-09-10: 50 ug via INTRAVENOUS
  Filled 2023-09-10: qty 1

## 2023-09-10 MED ORDER — METOPROLOL TARTRATE 25 MG PO TABS: 25.0000 mg | ORAL_TABLET | Freq: Two times a day (BID) | ORAL | Status: DC

## 2023-09-10 MED ORDER — ALUM & MAG HYDROXIDE-SIMETH 200-200-20 MG/5ML PO SUSP
30.0000 mL | ORAL | Status: DC | PRN
  Administered 2023-09-10 – 2023-09-11 (×3): 30 mL via ORAL
  Filled 2023-09-10 (×3): qty 30

## 2023-09-10 MED ORDER — MORPHINE SULFATE (PF) 4 MG/ML IV SOLN
4.0000 mg | INTRAVENOUS | Status: DC | PRN
  Administered 2023-09-10 – 2023-09-11 (×5): 4 mg via INTRAVENOUS
  Filled 2023-09-10 (×5): qty 1

## 2023-09-10 MED ORDER — HEPARIN BOLUS VIA INFUSION
2000.0000 [IU] | Freq: Once | INTRAVENOUS | Status: AC
  Administered 2023-09-10: 2000 [IU] via INTRAVENOUS
  Filled 2023-09-10: qty 2000

## 2023-09-10 NOTE — Progress Notes (Signed)
  Echocardiogram 2D Echocardiogram has been performed.  Brent Oneill 09/10/2023, 9:56 AM

## 2023-09-10 NOTE — Progress Notes (Incomplete)
  Echocardiogram 2D Echocardiogram has NOT been performed at this time per RN the patient is due to go to CT, I will attempt after lunch.  Lenor Coffin 09/10/2023, 7:52 AM

## 2023-09-10 NOTE — Progress Notes (Addendum)
Patient complaining of indigestion and 4/10 chest pain. He states, "I think the chest pain is coming from my indigestion." PRN Maloox given. EKG completed. Troponin increased during trend. Please see results review. [place], MD made aware. After interventions, patient states, "It is starting to feel better now. I think I just needed to get that indigestion out." Will continue to monitor.

## 2023-09-10 NOTE — Progress Notes (Signed)
Progress Note    Brent Oneill  ZOX:[id number] DOB: August 09, 1971  DOA: 09/09/2023 PCP: Sherron Monday, MD      Brief Narrative:    Medical records reviewed and are as summarized below:  Brent Oneill is a 52 y.o. male with medical history significant for persistent asthma, hypertension, hyperlipidemia, prediabetes, who presented to the hospital because of chest pain and shortness of breath.  He was found to have acute inferior ST elevation MI.  He was transferred to the Cath Lab for emergent left heart catheterization.  He was found to have coronary artery disease in the distal RCA.  According to Dr. Juliann Pares, cardiologist, developed ventricular fibrillation arrest while on the table.  He was treated with defibrillation, IV amiodarone and was started on IV Levophed infusion.  He was also started on tirofiban infusion.  The hospitalist team was consulted to admit the patient for further management.       Assessment/Plan:   Principal Problem:   STEMI (ST elevation myocardial infarction) (HCC)   There is no height or weight on file to calculate BMI.   Inferior STEMI: S/p left heart cath PCI to distal RCA.  Continue metoprolol, rosuvastatin, aspirin and Brilinta.  2D echo is pending. Follow-up with cardiologist.   S/p ventricular fibrillation cardiac arrest during left heart cath: S/p defibrillation.  He is off of IV amiodarone and Levophed drip.   Hypokalemia: Improved.  Continue potassium repletion Hypomagnesemia: Improved Hypophosphatemia: Improved     Hyperlipidemia: Continue rosuvastatin Total cholesterol 237, LDL 146, triglycerides 215, HDL 48.  He said he had been prescribed rosuvastatin as an outpatient but he was not taking it regularly.      Hyperglycemia: Hemoglobin A1c of 6.5 on 08/11/2023.  Repeat hemoglobin A1c.     History of hypertension: Hold antihypertensives.   GERD, epigastric pain (burning sensation): Continue IV Protonix.  Add IV famotidine.   Increased frequency of Maalox.  Morphine as needed for severe pain.     History of moderate persistent asthma: Compensated.  Continue bronchodilators.    Plan of care was discussed with his wife and daughter at the bedside.  Plan discussed with Tresa Endo, RN, at the bedside.   Diet Order             Diet Heart Room service appropriate? Yes; Fluid consistency: Thin  Diet effective now                    If I try to dictate something sometimes it shows up.  1 to        Consultants: Cardiologist  Procedures: Left heart cath    Medications:    aspirin  324 mg Oral Once   aspirin  81 mg Oral Daily   Chlorhexidine Gluconate Cloth  6 each Topical Daily   metoprolol succinate  12.5 mg Oral Daily   pantoprazole (PROTONIX) IV  40 mg Intravenous Q12H   potassium chloride  40 mEq Oral BID   rosuvastatin  40 mg Oral Daily   ticagrelor  90 mg Oral BID   Continuous Infusions:  famotidine (PEPCID) IV 100 mL/hr at 09/10/23 1100     Anti-infectives (From admission, onward)    None              Family Communication/Anticipated D/C date and plan/Code Status   DVT prophylaxis: SCDs Start: 09/09/23 1805     Code Status: Full Code  Family Communication: Plan discussed with his wife and daughter at the bedside  Disposition Plan: Plan to discharge home   Status is: Inpatient Remains inpatient appropriate because: Acute STEMI       Subjective:   Interval events noted.  He complains of severe epigastric pain.  Brent Oneill he has been suffering from acid reflux for about 15 years but this pain is more intense.  No shortness of breath or chest pain.  Tresa Endo, RN, was at the bedside.   Objective:    Vitals:   09/10/23 0900 09/10/23 1000 09/10/23 1100 09/10/23 1140  BP: 117/74 129/65 (!) 141/97   Pulse: (!) 102 97 98 (!) 108  Resp: (!) 23 16 15 19   Temp:      TempSrc:      SpO2: 99% 97% 98% 97%   No data found.   Intake/Output Summary (Last 24 hours) at  09/10/2023 1222 Last data filed at 09/10/2023 1100 Gross per 24 hour  Intake 2559.39 ml  Output 1175 ml  Net 1384.39 ml   There were no vitals filed for this visit.  Exam:  GEN: NAD SKIN: Warm and dry EYES: No pallor or icterus ENT: MMM CV: RRR PULM: CTA B ABD: soft, ND, epigastric tenderness, no rebound tenderness or guarding, +BS CNS: AAO x 3, non focal EXT: No edema or tenderness        Data Reviewed:   I have personally reviewed following labs and imaging studies:  Labs: Labs show the following:   Basic Metabolic Panel: Recent Labs  Lab 09/09/23 1101 09/09/23 1326 09/09/23 2017 09/10/23 0334  NA 136  --   --  133*  K 2.7*  --  3.9 3.7  CL 97*  --   --  97*  CO2 25  --   --  25  GLUCOSE 201*  --   --  171*  BUN 15  --   --  11  CREATININE 1.20  --   --  0.99  CALCIUM 9.5  --   --  8.7*  MG  --  1.0*  --  2.1  PHOS  --  1.2*  --  3.7   GFR Estimated Creatinine Clearance: 87.9 mL/min (by C-G formula based on SCr of 0.99 mg/dL). Liver Function Tests: Recent Labs  Lab 09/09/23 1101  AST 20  ALT 17  ALKPHOS 51  BILITOT 1.1  PROT 7.0  ALBUMIN 4.2   No results for input(s): "LIPASE", "AMYLASE" in the last 168 hours. No results for input(s): "AMMONIA" in the last 168 hours. Coagulation profile Recent Labs  Lab 09/09/23 1101  INR 1.1    CBC: Recent Labs  Lab 09/09/23 1101 09/09/23 2017 09/10/23 0334  WBC 9.6  --  10.8*  NEUTROABS 5.6  --   --   HGB 14.5  --  12.5*  HCT 42.6  --  35.5*  MCV 79.8*  --  77.7*  PLT 218 197 186   Cardiac Enzymes: No results for input(s): "CKTOTAL", "CKMB", "CKMBINDEX", "TROPONINI" in the last 168 hours. BNP (last 3 results) No results for input(s): "PROBNP" in the last 8760 hours. CBG: No results for input(s): "GLUCAP" in the last 168 hours. D-Dimer: No results for input(s): "DDIMER" in the last 72 hours. Hgb A1c: Recent Labs    09/09/23 1101  HGBA1C 6.8*   Lipid Profile: Recent Labs     09/09/23 1101  CHOL 237*  HDL 48  LDLCALC 146*  TRIG 215*  CHOLHDL 4.9   Thyroid function studies: No results for input(s): "TSH", "T4TOTAL", "T3FREE", "THYROIDAB" in the  last 72 hours.  Invalid input(s): "FREET3" Anemia work up: No results for input(s): "VITAMINB12", "FOLATE", "FERRITIN", "TIBC", "IRON", "RETICCTPCT" in the last 72 hours. Sepsis Labs: Recent Labs  Lab 09/09/23 1101 09/09/23 1137 09/10/23 0334  WBC 9.6  --  10.8*  LATICACIDVEN  --  2.1*  --     Microbiology Recent Results (from the past 240 hours)  MRSA Next Gen by PCR, Nasal     Status: None   Collection Time: 09/09/23  1:25 PM   Specimen: Nasal Mucosa; Nasal Swab  Result Value Ref Range Status   MRSA by PCR Next Gen NOT DETECTED NOT DETECTED Final    Comment: (NOTE) The GeneXpert MRSA Assay (FDA approved for NASAL specimens only), is one component of a comprehensive MRSA colonization surveillance program. It is not intended to diagnose MRSA infection nor to guide or monitor treatment for MRSA infections. Test performance is not FDA approved in patients less than 44 years old. Performed at [facility], [address] [place], [place] [address]     Procedures and diagnostic studies:  CT ABDOMEN PELVIS W CONTRAST Result Date: 09/10/2023 CLINICAL DATA:  52 year old male with history of epigastric pain. EXAM: CT ABDOMEN AND PELVIS WITH CONTRAST TECHNIQUE: Multidetector CT imaging of the abdomen and pelvis was performed using the standard protocol following bolus administration of intravenous contrast. RADIATION DOSE REDUCTION: This exam was performed according to the departmental dose-optimization program which includes automated exposure control, adjustment of the mA and/or kV according to patient size and/or use of iterative reconstruction technique. CONTRAST:  80mL OMNIPAQUE IOHEXOL 300 MG/ML  SOLN COMPARISON:  No priors. FINDINGS: Lower chest: Unremarkable. Hepatobiliary: No suspicious  cystic or solid hepatic lesions. Tiny calcified granulomas in the right lobe of the liver incidentally noted. High attenuation material filling the lumen of the gallbladder, indicative of a large volume of biliary sludge and/or vicarious excretion of contrast material related to recent cardiac catheterization. No definite pericholecystic fluid or surrounding inflammatory changes adjacent to the gallbladder. Gallbladder is only moderately distended. Pancreas: No pancreatic mass. No pancreatic ductal dilatation. No pancreatic or peripancreatic fluid collections or inflammatory changes. Spleen: Unremarkable. Adrenals/Urinary Tract: Bilateral kidneys and bilateral adrenal glands are normal in appearance. No hydroureteronephrosis. High attenuation urine within the lumen of the urinary bladder, likely secondary to excretion of contrast material from recent cardiac catheterization. Urinary bladder is otherwise grossly unremarkable in appearance. Stomach/Bowel: The appearance of the stomach is normal. No pathologic dilatation of small bowel or colon. Normal appendix. Vascular/Lymphatic: Mild atherosclerosis of the abdominal aorta and pelvic vasculature, without evidence of aneurysm or dissection in the abdominal or pelvic vasculature. No lymphadenopathy noted in the abdomen or pelvis. Reproductive: Prostate gland is enlarged measuring 6.2 x 5.6 cm. Other: No significant volume of ascites.  No pneumoperitoneum. Musculoskeletal: There are no aggressive appearing lytic or blastic lesions noted in the visualized portions of the skeleton. IMPRESSION: 1. No acute findings are noted in the abdomen or pelvis to account for the patient's symptoms. 2. Prostatomegaly. 3. High attenuation material in the lumen of the gallbladder, likely vicarious excretion of contrast material and/or biliary sludge. No surrounding inflammatory changes to suggest an acute cholecystitis at this time. 4. Aortic atherosclerosis. Electronically Signed   By:  Trudie Reed M.D.   On: 09/10/2023 08:23   CARDIAC CATHETERIZATION Result Date: 09/09/2023   Dist RCA lesion is 100% stenosed.   RPAV lesion is 100% stenosed.   A drug-eluting stent was successfully placed using a STENT ONYX FRONTIER  4.0X15 in the main branch.   Angioplasty was performed in the main branch. .   Post intervention, there is a 0% residual stenosis.   The left ventricular systolic function is normal.   LV end diastolic pressure is normal.   The left ventricular ejection fraction is 55-65% by visual estimate.   There is no mitral valve regurgitation.   Anticipated discharge date to be determined.   Recommend uninterrupted dual antiplatelet therapy with Aspirin 81mg  daily and Ticagrelor 90mg  twice daily for a minimum of 12 months (ACS-Class I recommendation). Conclusion Inferior STEMI presentation via EMS to the emergency room Patient received heparin bolus aspirin Brilinta in the emergency room Right radial approach Left ventriculogram Normal overall left ventricular function of at least 60% normal wall motion Inferior hypo- Coronaries Left main large free of disease LAD very large minor irregularities Circumflex very large minor irregularities RCA very large tortuous distal 100% occlusion IRA TIMI 0 flow Intervention Penumbra was used for thrombus extraction Patient had transient V-fib arrest requiring defibrillation Partially successful PCI stent distal RCA 4.0 x 15 mm frontier Onyx to 12 atm distal RCA Restoration of TIMI-3 flow through the PDA PL had TIMI I flow Significant thrombus ulcerated plaque covered by stent but still very visible Started on Aggrastat for 18 to 24 hours to help with thrombus and clot Patient placed on levo for blood pressure support fluids Making improvement in symptoms significant proved minute pain Case discussed with CM at [facility] STEMI patient on-call to review images for any other recommendations beyond what we have done so for He agreed that it was a good place to  stop and treat with Aggrastat to help with thrombus resolution and then reconsider whether we looking next week would be necessary Patient placed on aspirin Brilinta for at least 12 months Patient placed on amnio because of the V-fib arrest Transferred to ICU Anticipate discharge to be determined              LOS: 1 day   Brent Oneill  [facility]   Pager on [email or web address]. If 7PM-7AM, please contact night-coverage at [email or web address]     09/10/2023, 12:22 PM

## 2023-09-10 NOTE — Progress Notes (Signed)
Call[place], MD notified of patient stating he is still having what feels like indigestion discomfort in his upper abdomen that is now radiating to his mid back. CT ordered. Patient made aware.

## 2023-09-10 NOTE — Progress Notes (Signed)
Callwood, MD at bedside to assess patient. Verbal order to d/c amio gtt.

## 2023-09-10 NOTE — Plan of Care (Signed)

## 2023-09-10 NOTE — Progress Notes (Signed)
Patient continues with ST elevations that are unchanged from previous and consistent in nature, patient has no s/sx otherwise related to this, Dr. Juliann Pares given update. Will continue to monitor patient. EKG obtained also obtained earlier in the shift that is unchanged from previous.

## 2023-09-10 NOTE — Progress Notes (Signed)
Patient is alert and oriented x 4, able to verbalize needs.  Patient initially voiced severe discomfort related indigestion in the beginning of the shift, MD notified and ordered prn medications of which patient is now voicing relief of indigestion.  Pain controlled with prn morphine.  Right radial post-cath access site remains clean, dry, intact with +2 pulse, less than 3 second capillary refill, and WNL in color. Will continue to monitor patient.

## 2023-09-10 NOTE — Consult Note (Signed)
PHARMACY - ANTICOAGULATION CONSULT NOTE  Pharmacy Consult for Heparin Indication: chest pain/ACS  Allergies  Allergen Reactions   Quinine Derivatives Itching   Patient Measurements: Heparin Dosing Weight: 79.9 kg   Vital Signs: Temp: 98.4 F (36.9 C) (12/15 1950) Temp Source: Oral (12/15 1950) BP: 135/91 (12/15 1945) Pulse Rate: 110 (12/15 1945)  Labs: Recent Labs    09/09/23 1101 09/09/23 1326 09/09/23 2017 09/09/23 2307 09/10/23 0334 09/10/23 0711 09/10/23 0905 09/10/23 1348 09/10/23 1926  HGB 14.5  --   --   --  12.5*  --   --   --   --   HCT 42.6  --   --   --  35.5*  --   --   --   --   PLT 218  --  197  --  186  --   --   --   --   APTT 22*  --   --   --   --   --   --  75*  --   LABPROT 14.3  --   --   --   --   --   --  14.7  --   INR 1.1  --   --   --   --   --   --  1.1  --   HEPARINUNFRC  --   --   --   --   --   --   --   --  0.69  CREATININE 1.20  --   --   --  0.99  --   --   --   --   TROPONINIHS 9   < >  --  17,179*  --  12,533* 13,870*  --   --    < > = values in this interval not displayed.   Estimated Creatinine Clearance: 87.9 mL/min (by C-G formula based on SCr of 0.99 mg/dL).  Medical History: Past Medical History:  Diagnosis Date   Allergic rhinitis 03/14/2016   Asthma, persistent 03/14/2016   Medications:  No PTA anticoagulation  Received one prophylactic dose of Lovenox 12/15 @ 1029   Assessment: Brent Oneill is a 52 year old male that presented with chest pain and SOB. PMH significant for persistent asthma, hypertension, hyperlipidemia, & prediabetes. Patient found to have an acute STEMI and was brought to the cath lab for a LHC. He was found to have CAD. According to Dr. Juliann Pares, he developed ventricular fibrillation arrest while on the table. He was treated with defibrillation, IV amiodarone and was started on IV Levophed infusion. He was also started on tirofiban infusion. Pharmacy has now been consulted for initiation and management  of a heparin infusion for ACS. Baseline labs: Hgb 12.5, PLT 186, aPTT and PT/INR ordered.   12/15 1926  HL 0.69, therapeutic x 1  Goal of Therapy:  Heparin level 0.3-0.7 units/ml Monitor platelets by anticoagulation protocol: Yes   Plan:  Continue heparin infusion at 950 units/hr  Check confirmatory HL in 6 hours  Monitor CBC daily while on heparin   Clovia Cuff, PharmD, BCPS 09/10/2023 8:05 PM

## 2023-09-10 NOTE — Progress Notes (Signed)
Traveled with patient to CT scan and back to unit with transport, patient remained on cardiac monitoring and in hospital bed, tolerated well.

## 2023-09-10 NOTE — Consult Note (Signed)
PHARMACY - ANTICOAGULATION CONSULT NOTE  Pharmacy Consult for Heparin Indication: chest pain/ACS  Allergies  Allergen Reactions   Quinine Derivatives Itching   Patient Measurements: Heparin Dosing Weight: 79.9 kg   Vital Signs: Temp: 98.6 F (37 C) (12/15 0800) Temp Source: Oral (12/15 0800) BP: 141/97 (12/15 1100) Pulse Rate: 108 (12/15 1140)  Labs: Recent Labs    09/09/23 1101 09/09/23 1326 09/09/23 2017 09/09/23 2307 09/10/23 0334 09/10/23 0711 09/10/23 0905  HGB 14.5  --   --   --  12.5*  --   --   HCT 42.6  --   --   --  35.5*  --   --   PLT 218  --  197  --  186  --   --   APTT 22*  --   --   --   --   --   --   LABPROT 14.3  --   --   --   --   --   --   INR 1.1  --   --   --   --   --   --   CREATININE 1.20  --   --   --  0.99  --   --   TROPONINIHS 9   < >  --  17,179*  --  12,533* 13,870*   < > = values in this interval not displayed.   Estimated Creatinine Clearance: 87.9 mL/min (by C-G formula based on SCr of 0.99 mg/dL).  Medical History: Past Medical History:  Diagnosis Date   Allergic rhinitis 03/14/2016   Asthma, persistent 03/14/2016   Medications:  No PTA anticoagulation  Received one prophylactic dose of Lovenox 12/15 @ 1029   Assessment: Brent Oneill is a 52 year old male that presented with chest pain and SOB. PMH significant for persistent asthma, hypertension, hyperlipidemia, & prediabetes. Patient found to have an acute STEMI and was brought to the cath lab for a LHC. He was found to have CAD. According to Dr. Juliann Pares, he developed ventricular fibrillation arrest while on the table. He was treated with defibrillation, IV amiodarone and was started on IV Levophed infusion. He was also started on tirofiban infusion. Pharmacy has now been consulted for initiation and management of a heparin infusion for ACS. Baseline labs: Hgb 12.5, PLT 186, aPTT and PT/INR ordered.   Goal of Therapy:  Heparin level 0.3-0.7 units/ml Monitor platelets by  anticoagulation protocol: Yes   Plan:  Give a half bolus of 2000 units x 1  Start heparin infusion at 950 units/hr  Check HL 6 hours after initiation of infusion  Monitor CBC daily while on heparin   Littie Deeds, PharmD Pharmacy Resident  09/10/2023 12:25 PM

## 2023-09-10 NOTE — Progress Notes (Signed)
Patient ID: Brent Oneill, male   DOB: 08-Feb-1971, 52 y.o.   MRN: [id number] [facility]    SUBJECTIVE: Patient feeling somewhat improved less pain resting comfortably still little bit tachycardic blood pressure is much improved feeling much better has not ambulated yet improved nausea EKG on changed no bleeding no significant chest pain still some upper epigastric discomfort but much improved   Vitals:   09/10/23 0900 09/10/23 1000 09/10/23 1100 09/10/23 1140  BP: 117/74 129/65 (!) 141/97   Pulse: (!) 102 97 98 (!) 108  Resp: (!) 23 16 15 19   Temp:      TempSrc:      SpO2: 99% 97% 98% 97%     Intake/Output Summary (Last 24 hours) at 09/10/2023 1153 Last data filed at 09/10/2023 1100 Gross per 24 hour  Intake 2559.39 ml  Output 1175 ml  Net 1384.39 ml      PHYSICAL EXAM  General: Well developed, well nourished, in no acute distress HEENT:  Normocephalic and atramatic Neck:  No JVD.  Lungs: Clear bilaterally to auscultation and percussion. Heart: HRRR . Normal S1 and S2 without gallops or murmurs.  Abdomen: Bowel sounds are positive, abdomen soft and non-tender  Msk:  Back normal, normal gait. Normal strength and tone for age. Extremities: No clubbing, cyanosis or edema.   Neuro: Alert and oriented X 3. Psych:  Good affect, responds appropriately   LABS: Basic Metabolic Panel: Recent Labs    09/09/23 1101 09/09/23 1326 09/09/23 2017 09/10/23 0334  NA 136  --   --  133*  K 2.7*  --  3.9 3.7  CL 97*  --   --  97*  CO2 25  --   --  25  GLUCOSE 201*  --   --  171*  BUN 15  --   --  11  CREATININE 1.20  --   --  0.99  CALCIUM 9.5  --   --  8.7*  MG  --  1.0*  --  2.1  PHOS  --  1.2*  --  3.7   Liver Function Tests: Recent Labs    09/09/23 1101  AST 20  ALT 17  ALKPHOS 51  BILITOT 1.1  PROT 7.0  ALBUMIN 4.2   No results for input(s): "LIPASE", "AMYLASE" in the last 72 hours. CBC: Recent Labs    09/09/23 1101 09/09/23 2017 09/10/23 0334  WBC 9.6   --  10.8*  NEUTROABS 5.6  --   --   HGB 14.5  --  12.5*  HCT 42.6  --  35.5*  MCV 79.8*  --  77.7*  PLT 218 197 186   Cardiac Enzymes: No results for input(s): "CKTOTAL", "CKMB", "CKMBINDEX", "TROPONINI" in the last 72 hours. BNP: Invalid input(s): "POCBNP" D-Dimer: No results for input(s): "DDIMER" in the last 72 hours. Hemoglobin A1C: Recent Labs    09/09/23 1101  HGBA1C 6.8*   Fasting Lipid Panel: Recent Labs    09/09/23 1101  CHOL 237*  HDL 48  LDLCALC 146*  TRIG 215*  CHOLHDL 4.9   Thyroid Function Tests: No results for input(s): "TSH", "T4TOTAL", "T3FREE", "THYROIDAB" in the last 72 hours.  Invalid input(s): "FREET3" Anemia Panel: No results for input(s): "VITAMINB12", "FOLATE", "FERRITIN", "TIBC", "IRON", "RETICCTPCT" in the last 72 hours.  CT ABDOMEN PELVIS W CONTRAST Result Date: 09/10/2023 CLINICAL DATA:  52 year old male with history of epigastric pain. EXAM: CT ABDOMEN AND PELVIS WITH CONTRAST TECHNIQUE: Multidetector CT imaging of the abdomen and pelvis was  performed using the standard protocol following bolus administration of intravenous contrast. RADIATION DOSE REDUCTION: This exam was performed according to the departmental dose-optimization program which includes automated exposure control, adjustment of the mA and/or kV according to patient size and/or use of iterative reconstruction technique. CONTRAST:  80mL OMNIPAQUE IOHEXOL 300 MG/ML  SOLN COMPARISON:  No priors. FINDINGS: Lower chest: Unremarkable. Hepatobiliary: No suspicious cystic or solid hepatic lesions. Tiny calcified granulomas in the right lobe of the liver incidentally noted. High attenuation material filling the lumen of the gallbladder, indicative of a large volume of biliary sludge and/or vicarious excretion of contrast material related to recent cardiac catheterization. No definite pericholecystic fluid or surrounding inflammatory changes adjacent to the gallbladder. Gallbladder is only  moderately distended. Pancreas: No pancreatic mass. No pancreatic ductal dilatation. No pancreatic or peripancreatic fluid collections or inflammatory changes. Spleen: Unremarkable. Adrenals/Urinary Tract: Bilateral kidneys and bilateral adrenal glands are normal in appearance. No hydroureteronephrosis. High attenuation urine within the lumen of the urinary bladder, likely secondary to excretion of contrast material from recent cardiac catheterization. Urinary bladder is otherwise grossly unremarkable in appearance. Stomach/Bowel: The appearance of the stomach is normal. No pathologic dilatation of small bowel or colon. Normal appendix. Vascular/Lymphatic: Mild atherosclerosis of the abdominal aorta and pelvic vasculature, without evidence of aneurysm or dissection in the abdominal or pelvic vasculature. No lymphadenopathy noted in the abdomen or pelvis. Reproductive: Prostate gland is enlarged measuring 6.2 x 5.6 cm. Other: No significant volume of ascites.  No pneumoperitoneum. Musculoskeletal: There are no aggressive appearing lytic or blastic lesions noted in the visualized portions of the skeleton. IMPRESSION: 1. No acute findings are noted in the abdomen or pelvis to account for the patient's symptoms. 2. Prostatomegaly. 3. High attenuation material in the lumen of the gallbladder, likely vicarious excretion of contrast material and/or biliary sludge. No surrounding inflammatory changes to suggest an acute cholecystitis at this time. 4. Aortic atherosclerosis. Electronically Signed   By: Trudie Reed M.D.   On: 09/10/2023 08:23   CARDIAC CATHETERIZATION Result Date: 09/09/2023   Dist RCA lesion is 100% stenosed.   RPAV lesion is 100% stenosed.   A drug-eluting stent was successfully placed using a STENT ONYX FRONTIER 4.0X15 in the main branch.   Angioplasty was performed in the main branch. .   Post intervention, there is a 0% residual stenosis.   The left ventricular systolic function is normal.   LV  end diastolic pressure is normal.   The left ventricular ejection fraction is 55-65% by visual estimate.   There is no mitral valve regurgitation.   Anticipated discharge date to be determined.   Recommend uninterrupted dual antiplatelet therapy with Aspirin 81mg  daily and Ticagrelor 90mg  twice daily for a minimum of 12 months (ACS-Class I recommendation). Conclusion Inferior STEMI presentation via EMS to the emergency room Patient received heparin bolus aspirin Brilinta in the emergency room Right radial approach Left ventriculogram Normal overall left ventricular function of at least 60% normal wall motion Inferior hypo- Coronaries Left main large free of disease LAD very large minor irregularities Circumflex very large minor irregularities RCA very large tortuous distal 100% occlusion IRA TIMI 0 flow Intervention Penumbra was used for thrombus extraction Patient had transient V-fib arrest requiring defibrillation Partially successful PCI stent distal RCA 4.0 x 15 mm frontier Onyx to 12 atm distal RCA Restoration of TIMI-3 flow through the PDA PL had TIMI I flow Significant thrombus ulcerated plaque covered by stent but still very visible Started on Aggrastat for 18  to 24 hours to help with thrombus and clot Patient placed on levo for blood pressure support fluids Making improvement in symptoms significant proved minute pain Case discussed with CM at [facility] STEMI patient on-call to review images for any other recommendations beyond what we have done so for He agreed that it was a good place to stop and treat with Aggrastat to help with thrombus resolution and then reconsider whether we looking next week would be necessary Patient placed on aspirin Brilinta for at least 12 months Patient placed on amnio because of the V-fib arrest Transferred to ICU Anticipate discharge to be determined  CT of the abdomen and pelvis unremarkable  Echo of left ventricular function EF of at least 55 to 60%  TELEMETRY: Sinus  tachycardia 110 nonspecific ST-T changes  ASSESSMENT AND PLAN:  Principal Problem:   STEMI (ST elevation myocardial infarction) (HCC) Hypertension Epigastric pain Tachycardia Hyperlipidemia Mild obesity Status post complex PCI and stent to distal RCA . Plan Continue intensive care management and care consider transfer to telemetry tomorrow Continue anticoagulation was on Aggrastat now on IV heparin Consider repeat cath for thrombotic distal RCA PCI and stent Maintain blood pressure management and support Increase beta-blockade for tachycardia Continue statin therapy for hyperlipidemia Pain control of epigastric discomfort     Alwyn Pea, MD 09/10/2023 11:53 AM

## 2023-09-10 NOTE — Plan of Care (Signed)
Continuing with plan of care. 

## 2023-09-11 ENCOUNTER — Encounter
Admission: RE | Disposition: A | Discharge status: Home / Self Care | Payer: Self-pay | Source: Home / Self Care | Attending: Internal Medicine

## 2023-09-11 ENCOUNTER — Other Ambulatory Visit: Payer: Self-pay

## 2023-09-11 ENCOUNTER — Encounter: Payer: Self-pay | Admitting: Internal Medicine

## 2023-09-11 DIAGNOSIS — I213 ST elevation (STEMI) myocardial infarction of unspecified site: Secondary | ICD-10-CM | POA: Diagnosis not present

## 2023-09-11 HISTORY — PX: LEFT HEART CATH AND CORONARY ANGIOGRAPHY: CATH118249

## 2023-09-11 LAB — BASIC METABOLIC PANEL
Anion gap: 10 (ref 5–15)
BUN: 9 mg/dL (ref 6–20)
CO2: 25 mmol/L (ref 22–32)
Calcium: 9 mg/dL (ref 8.9–10.3)
Chloride: 91 mmol/L — ABNORMAL LOW (ref 98–111)
Creatinine, Ser: 1.01 mg/dL (ref 0.61–1.24)
GFR, Estimated: >60 mL/min (ref >60)
Glucose, Bld: 117 mg/dL — ABNORMAL HIGH (ref 70–99)
Potassium: 3.9 mmol/L (ref 3.5–5.1)
Sodium: 126 mmol/L — ABNORMAL LOW (ref 135–145)

## 2023-09-11 LAB — GLUCOSE, CAPILLARY
Glucose-Capillary: 185 mg/dL — ABNORMAL HIGH (ref 70–99)
Glucose-Capillary: 164 mg/dL — ABNORMAL HIGH (ref 70–99)

## 2023-09-11 LAB — CBC
HCT: 39.7 % (ref 39.0–52.0)
Hemoglobin: 13.7 g/dL (ref 13.0–17.0)
MCH: 27.2 pg (ref 26.0–34.0)
MCHC: 34.5 g/dL (ref 30.0–36.0)
MCV: 78.8 fL — ABNORMAL LOW (ref 80.0–100.0)
Platelets: 220 10*3/uL (ref 150–400)
RBC: 5.04 MIL/uL (ref 4.22–5.81)
RDW: 12 % (ref 11.5–15.5)
WBC: 11.1 10*3/uL — ABNORMAL HIGH (ref 4.0–10.5)
nRBC: 0 % (ref 0.0–0.2)

## 2023-09-11 LAB — OSMOLALITY: Osmolality: 273 mosm/kg — ABNORMAL LOW (ref 275–295)

## 2023-09-11 LAB — LIPOPROTEIN A (LPA): Lipoprotein (a): 35.1 nmol/L — ABNORMAL HIGH (ref <75.0)

## 2023-09-11 LAB — OSMOLALITY, URINE: Osmolality, Ur: 94 mosm/kg — ABNORMAL LOW (ref 300–900)

## 2023-09-11 LAB — HEPARIN LEVEL (UNFRACTIONATED): Heparin Unfractionated: 0.49 [IU]/mL (ref 0.30–0.70)

## 2023-09-11 SURGERY — LEFT HEART CATH AND CORONARY ANGIOGRAPHY
Anesthesia: Moderate Sedation

## 2023-09-11 MED ORDER — SODIUM CHLORIDE 0.9 % WEIGHT BASED INFUSION
1.0000 mL/kg/h | INTRAVENOUS | Status: AC
Start: 2023-09-11 — End: 2023-09-12
  Administered 2023-09-11: 1 mL/kg/h via INTRAVENOUS

## 2023-09-11 MED ORDER — VERAPAMIL HCL 2.5 MG/ML IV SOLN
INTRAVENOUS | Status: DC | PRN
  Administered 2023-09-11: 2.5 mg via INTRA_ARTERIAL

## 2023-09-11 MED ORDER — SODIUM CHLORIDE 0.9 % WEIGHT BASED INFUSION: 1.0000 mL/kg/h | INTRAVENOUS | Status: DC

## 2023-09-11 MED ORDER — BUTALBITAL-APAP-CAFFEINE 50-325-40 MG PO TABS
1.0000 | ORAL_TABLET | Freq: Once | ORAL | Status: AC
  Administered 2023-09-11: 1 via ORAL
  Filled 2023-09-11: qty 1

## 2023-09-11 MED ORDER — SODIUM CHLORIDE 0.9% FLUSH: 3.0000 mL | INTRAVENOUS | Status: DC | PRN

## 2023-09-11 MED ORDER — SODIUM CHLORIDE 0.9 % WEIGHT BASED INFUSION
3.0000 mL/kg/h | INTRAVENOUS | Status: AC
Start: 2023-09-11 — End: 2023-09-11
  Administered 2023-09-11: 3 mL/kg/h via INTRAVENOUS

## 2023-09-11 MED ORDER — HEPARIN (PORCINE) IN NACL 1000-0.9 UT/500ML-% IV SOLN
INTRAVENOUS | Status: DC | PRN
  Administered 2023-09-11: 1000 mL

## 2023-09-11 MED ORDER — VERAPAMIL HCL 2.5 MG/ML IV SOLN
INTRAVENOUS | Status: AC
  Filled 2023-09-11: qty 2

## 2023-09-11 MED ORDER — MIDAZOLAM HCL 2 MG/2ML IJ SOLN
INTRAMUSCULAR | Status: DC | PRN
  Administered 2023-09-11: .5 mg via INTRAVENOUS

## 2023-09-11 MED ORDER — ASPIRIN 325 MG PO TBEC: 325.0000 mg | DELAYED_RELEASE_TABLET | Freq: Every day | ORAL | Status: DC

## 2023-09-11 MED ORDER — CLOPIDOGREL BISULFATE 300 MG PO TABS
600.0000 mg | ORAL_TABLET | Freq: Once | ORAL | Status: AC
  Administered 2023-09-11: 600 mg via ORAL
  Filled 2023-09-11: qty 2

## 2023-09-11 MED ORDER — HEPARIN (PORCINE) IN NACL 1000-0.9 UT/500ML-% IV SOLN
INTRAVENOUS | Status: AC
  Filled 2023-09-11: qty 1000

## 2023-09-11 MED ORDER — IOHEXOL 300 MG/ML  SOLN
INTRAMUSCULAR | Status: DC | PRN
  Administered 2023-09-11: 34 mL

## 2023-09-11 MED ORDER — SODIUM CHLORIDE 0.9 % IV SOLN: 250.0000 mL | INTRAVENOUS | Status: DC | PRN

## 2023-09-11 MED ORDER — CLOPIDOGREL BISULFATE 75 MG PO TABS
75.0000 mg | ORAL_TABLET | Freq: Every day | ORAL | Status: DC
  Administered 2023-09-12 – 2023-09-13 (×2): 75 mg via ORAL
  Filled 2023-09-11 (×2): qty 1

## 2023-09-11 MED ORDER — SODIUM CHLORIDE 0.9 % WEIGHT BASED INFUSION: 3.0000 mL/kg/h | INTRAVENOUS | Status: DC

## 2023-09-11 MED ORDER — HEPARIN SODIUM (PORCINE) 1000 UNIT/ML IJ SOLN
INTRAMUSCULAR | Status: AC
  Filled 2023-09-11: qty 10

## 2023-09-11 MED ORDER — FENTANYL CITRATE (PF) 100 MCG/2ML IJ SOLN
INTRAMUSCULAR | Status: DC | PRN
  Administered 2023-09-11: 12.5 ug via INTRAVENOUS

## 2023-09-11 MED ORDER — HEPARIN SODIUM (PORCINE) 1000 UNIT/ML IJ SOLN
INTRAMUSCULAR | Status: DC | PRN
  Administered 2023-09-11: 4000 [IU] via INTRAVENOUS

## 2023-09-11 MED ORDER — LIDOCAINE HCL 1 % IJ SOLN
INTRAMUSCULAR | Status: DC | PRN
  Administered 2023-09-11: 2 mL

## 2023-09-11 MED ORDER — SODIUM CHLORIDE 0.9 % IV BOLUS
500.0000 mL | Freq: Once | INTRAVENOUS | Status: AC
  Administered 2023-09-11: 500 mL via INTRAVENOUS

## 2023-09-11 MED ORDER — MIDAZOLAM HCL 2 MG/2ML IJ SOLN
INTRAMUSCULAR | Status: AC
  Filled 2023-09-11: qty 2

## 2023-09-11 MED ORDER — SODIUM CHLORIDE 0.9% FLUSH: 3.0000 mL | Freq: Two times a day (BID) | INTRAVENOUS | Status: DC

## 2023-09-11 MED ORDER — FENTANYL CITRATE (PF) 100 MCG/2ML IJ SOLN
INTRAMUSCULAR | Status: AC
  Filled 2023-09-11: qty 2

## 2023-09-11 SURGICAL SUPPLY — 10 items
CATH 5FR JL3.5 JR4 ANG PIG MP (CATHETERS) IMPLANT
DEVICE RAD TR BAND REGULAR (VASCULAR PRODUCTS) IMPLANT
DRAPE BRACHIAL (DRAPES) IMPLANT
GLIDESHEATH SLEND SS 6F .021 (SHEATH) IMPLANT
GUIDEWIRE INQWIRE 1.5J.035X260 (WIRE) IMPLANT
INQWIRE 1.5J .035X260CM (WIRE) ×1
PACK CARDIAC CATH (CUSTOM PROCEDURE TRAY) ×1 IMPLANT
PROTECTION STATION PRESSURIZED (MISCELLANEOUS) ×1
SET ATX-X65L (MISCELLANEOUS) IMPLANT
STATION PROTECTION PRESSURIZED (MISCELLANEOUS) IMPLANT

## 2023-09-11 NOTE — Progress Notes (Addendum)
Progress Note   Patient: Brent Oneill ZOX:[id number] DOB: 10/08/1970 DOA: 09/09/2023     2 DOS: the patient was seen and examined on 09/11/2023   Brief hospital course: Brent Oneill is a 52 y.o. male with medical history significant for persistent asthma, hypertension, hyperlipidemia, prediabetes, who presented to the hospital because of chest pain and shortness of breath.  He was found to have acute inferior ST elevation MI.  He was transferred to the Cath Lab for emergent left heart catheterization.  He was found to have coronary artery disease in the distal RCA.  According to Dr. Juliann Pares, cardiologist, developed ventricular fibrillation arrest while on the table.  He was treated with defibrillation, IV amiodarone and was started on IV Levophed infusion.  He was also started on tirofiban infusion.  The hospitalist team was consulted to admit the patient for further management.     Assessment and Plan:   Principal Problem:  STEMI (ST elevation myocardial infarction) (HCC)     There is no height or weight on file to calculate BMI.     Inferior STEMI: S/p left heart cath PCI to distal RCA.  Continue metoprolol, rosuvastatin, aspirin and Brilinta.  2D echo is pending. Follow-up with cardiologist.     S/p ventricular fibrillation cardiac arrest during left heart cath: S/p defibrillation.  He is off of IV amiodarone and Levophed drip.     Hypokalemia: Improved.  Continue potassium repletion Hypomagnesemia: Improved Hypophosphatemia: Improved     Hyperlipidemia: Continue rosuvastatin Total cholesterol 237, LDL 146, triglycerides 215, HDL 48.  He said he had been prescribed rosuvastatin as an outpatient but he was not taking it regularly.       Hyperglycemia: Hemoglobin A1c of 6.5 on 08/11/2023.  Repeat hemoglobin A1c. 6.8 Has agreed to metformin at discharge     History of hypertension: Hold antihypertensives.     GERD, epigastric pain (burning sensation): Continue IV Protonix.   Add IV famotidine.  Increased frequency of Maalox.  Morphine as needed for severe pain.     History of moderate persistent asthma: Compensated.  Continue bronchodilators.     Plan of care was discussed with his wife and daughter at the bedside.  Plan discussed with Tresa Endo, RN, at the bedside.      Code Status: Full code.  Family Communication: Plan discussed with Ezekiel, son, at the bedside  Disposition Plan: Plan to discharge home  Consults called: Cardiologist  Admission status: Inpatient   Subjective:  Patient seen and examined at bedside this morning in the presence of the daughter Denies chest pain worsening shortness of breath nausea vomiting   Physical Exam: Gen: No acute distress. Head: Normocephalic, atraumatic. Eyes: Pupils equal, round and reactive to light. Extraocular movements intact.  Sclerae nonicteric.  Mouth: Oropharynx reveals moist mucous membranes, normal dentition Neck: Supple, no thyromegaly, no lymphadenopathy, no jugular venous distention. Chest: Lungs are clear to auscultation with good air movement. No rales, rhonchi or wheezes.  CV: Heart sounds are regular with an S1, S2. No murmurs, rubs or gallops.  Abdomen: Soft, nontender, nondistended with normal active bowel sounds. No palpable masses. Extremities: Extremities are without clubbing, or cyanosis. No edema. Pedal pulses 2+.  Skin: Warm and dry. No rashes, lesions or wounds Neuro: Alert and oriented times 3; grossly nonfocal.  Psych: Insight is good and judgment is appropriate. Mood and affect normal.    Vitals:   09/11/23 1530 09/11/23 1600 09/11/23 1700 09/11/23 1806  BP:  (!) 89/45 93/65 101/62  Pulse: Marland Kitchen)  103 (!) 111 (!) 103 (!) 103  Resp: 17  20   Temp:      TempSrc:      SpO2: 98% 97% 98% 98%  Weight:      Height:        Data Reviewed:    Latest Ref Rng & Units 09/11/2023    3:40 PM 09/10/2023    3:34 AM 09/09/2023    8:17 PM  BMP  Glucose 70 - 99 mg/dL 086  578    BUN  6 - 20 mg/dL 9  11    Creatinine 4.69 - 1.24 mg/dL 6.29  5.28    Sodium 413 - 145 mmol/L 126  133    Potassium 3.5 - 5.1 mmol/L 3.9  3.7  3.9   Chloride 98 - 111 mmol/L 91  97    CO2 22 - 32 mmol/L 25  25    Calcium 8.9 - 10.3 mg/dL 9.0  8.7         Latest Ref Rng & Units 09/11/2023    4:37 AM 09/10/2023    3:34 AM 09/09/2023    8:17 PM  CBC  WBC 4.0 - 10.5 K/uL 11.1  10.8    Hemoglobin 13.0 - 17.0 g/dL 24.4  01.0    Hematocrit 39.0 - 52.0 % 39.7  35.5    Platelets 150 - 400 K/uL 220  186  197      Disposition: Status is: Inpatient   Time spent: 45 minutes  Author: Loyce Dys, MD 09/11/2023 7:00 PM  For on call review [email or web address].

## 2023-09-11 NOTE — Consult Note (Signed)
PHARMACY - ANTICOAGULATION CONSULT NOTE  Pharmacy Consult for Heparin Indication: chest pain/ACS  Allergies  Allergen Reactions   Quinine Derivatives Itching   Patient Measurements: Heparin Dosing Weight: 79.9 kg   Vital Signs: Temp: 98.3 F (36.8 C) (12/15 2349) Temp Source: Oral (12/15 2349) BP: 124/71 (12/16 0200) Pulse Rate: 102 (12/16 0200)  Labs: Recent Labs    09/09/23 1101 09/09/23 1326 09/09/23 2017 09/09/23 2307 09/10/23 0334 09/10/23 0711 09/10/23 0905 09/10/23 1348 09/10/23 1926 09/11/23 0141  HGB 14.5  --   --   --  12.5*  --   --   --   --   --   HCT 42.6  --   --   --  35.5*  --   --   --   --   --   PLT 218  --  197  --  186  --   --   --   --   --   APTT 22*  --   --   --   --   --   --  75*  --   --   LABPROT 14.3  --   --   --   --   --   --  14.7  --   --   INR 1.1  --   --   --   --   --   --  1.1  --   --   HEPARINUNFRC  --   --   --   --   --   --   --   --  0.69 0.49  CREATININE 1.20  --   --   --  0.99  --   --   --   --   --   TROPONINIHS 9   < >  --  17,179*  --  12,533* 13,870*  --   --   --    < > = values in this interval not displayed.   Estimated Creatinine Clearance: 87.9 mL/min (by C-G formula based on SCr of 0.99 mg/dL).  Medical History: Past Medical History:  Diagnosis Date   Allergic rhinitis 03/14/2016   Asthma, persistent 03/14/2016   Medications:  No PTA anticoagulation  Received one prophylactic dose of Lovenox 12/15 @ 1029   Assessment: Brent Oneill is a 52 year old male that presented with chest pain and SOB. PMH significant for persistent asthma, hypertension, hyperlipidemia, & prediabetes. Patient found to have an acute STEMI and was brought to the cath lab for a LHC. He was found to have CAD. According to Dr. Juliann Pares, he developed ventricular fibrillation arrest while on the table. He was treated with defibrillation, IV amiodarone and was started on IV Levophed infusion. He was also started on tirofiban infusion.  Pharmacy has now been consulted for initiation and management of a heparin infusion for ACS. Baseline labs: Hgb 12.5, PLT 186, aPTT and PT/INR ordered.   12/15 1926  HL 0.69, therapeutic x 1 12/16 0141 HL 0.49, therapeutic x 2  Goal of Therapy:  Heparin level 0.3-0.7 units/ml Monitor platelets by anticoagulation protocol: Yes   Plan:  Continue heparin infusion at 950 units/hr  Recheck HL daily w/ AM labs while therapeutic Monitor CBC daily while on heparin   Otelia Sergeant, PharmD, [facility] 09/11/2023 2:27 AM

## 2023-09-11 NOTE — Progress Notes (Signed)
[facility] CLINIC CARDIOLOGY PROGRESS NOTE   Patient ID: Ge Slaski MRN: [id number] DOB/AGE: 01/14/1971 52 y.o.  Admit date: 09/09/2023 Referring Physician None - STEMI pt Primary Physician Sherron Monday, MD  Primary Cardiologist None Reason for Consultation STEMI  HPI: Johany Karges is a 52 y.o. male with a past medical history of asthma, hypertension, hyperlipidemia, prediabetes who presented to the ED on 09/09/2023 for chest pain.  Noted to have inferior ST elevation on EKG and was taken emergently to the cardiac Cath Lab.  LHC revealed thrombosis of distal RCA and he underwent stent placement.  Developed ventricular fibrillation while in the lab and was defibrillated.  Interval History:  -Patient seen and examined this morning, reports he is feeling much better overall. -He denies any recurrence of chest pain.  States that he had indigestion symptoms yesterday which were similar to prior episodes of indigestion he has had in the past.  This was different than the chest pain that he had prior to presentation. -BP has remained stable off of pressors, he has remained borderline tachycardic.  Review of systems complete and found to be negative unless listed above    Vitals:   09/11/23 1210 09/11/23 1435 09/11/23 1445 09/11/23 1500  BP: 122/85 106/74 (!) 92/59 (!) 95/57  Pulse: (!) 102 100 98 (!) 102  Resp: 20 16 15 19   Temp: 98.4 F (36.9 C)     TempSrc: Oral     SpO2: 98% 97% 97% 98%  Weight: 81.1 kg     Height: 5\' 6"  (1.676 m)        Intake/Output Summary (Last 24 hours) at 09/11/2023 1501 Last data filed at 09/11/2023 1445 Gross per 24 hour  Intake 880.92 ml  Output 1600 ml  Net -719.08 ml     PHYSICAL EXAM General: Well-appearing male, well nourished, in no acute distress sitting upright in hospital bed with wife present at bedside. HEENT: Normocephalic and atraumatic. Neck: No JVD.  Lungs: Normal respiratory effort on room air. Clear bilaterally to auscultation.  No wheezes, crackles, rhonchi.  Heart: HRRR, fast rate. Normal S1 and S2 without gallops or murmurs. Radial & DP pulses 2+ bilaterally. Abdomen: Non-distended appearing.  Msk: Normal strength and tone for age. Extremities: No clubbing, cyanosis or edema.   Neuro: Alert and oriented X 3. Psych: Mood appropriate, affect congruent.    LABS: Basic Metabolic Panel: Recent Labs    09/09/23 1101 09/09/23 1326 09/09/23 2017 09/10/23 0334  NA 136  --   --  133*  K 2.7*  --  3.9 3.7  CL 97*  --   --  97*  CO2 25  --   --  25  GLUCOSE 201*  --   --  171*  BUN 15  --   --  11  CREATININE 1.20  --   --  0.99  CALCIUM 9.5  --   --  8.7*  MG  --  1.0*  --  2.1  PHOS  --  1.2*  --  3.7   Liver Function Tests: Recent Labs    09/09/23 1101  AST 20  ALT 17  ALKPHOS 51  BILITOT 1.1  PROT 7.0  ALBUMIN 4.2   No results for input(s): "LIPASE", "AMYLASE" in the last 72 hours. CBC: Recent Labs    09/09/23 1101 09/09/23 2017 09/10/23 0334 09/11/23 0437  WBC 9.6  --  10.8* 11.1*  NEUTROABS 5.6  --   --   --   HGB 14.5  --  12.5* 13.7  HCT 42.6  --  35.5* 39.7  MCV 79.8*  --  77.7* 78.8*  PLT 218   < > 186 220   < > = values in this interval not displayed.   Cardiac Enzymes: Recent Labs    09/09/23 2307 09/10/23 0711 09/10/23 0905  TROPONINIHS 17,179* 12,533* 13,870*   BNP: No results for input(s): "BNP" in the last 72 hours. D-Dimer: No results for input(s): "DDIMER" in the last 72 hours. Hemoglobin A1C: Recent Labs    09/09/23 1101  HGBA1C 6.8*   Fasting Lipid Panel: Recent Labs    09/09/23 1101  CHOL 237*  HDL 48  LDLCALC 146*  TRIG 215*  CHOLHDL 4.9   Thyroid Function Tests: No results for input(s): "TSH", "T4TOTAL", "T3FREE", "THYROIDAB" in the last 72 hours.  Invalid input(s): "FREET3" Anemia Panel: No results for input(s): "VITAMINB12", "FOLATE", "FERRITIN", "TIBC", "IRON", "RETICCTPCT" in the last 72 hours.  ECHOCARDIOGRAM COMPLETE Result Date:  09/10/2023    ECHOCARDIOGRAM REPORT   Patient Name:   CONFESOR CAPEL Date of Exam: 09/10/2023 Medical Rec #:  [id number]   Height:       66.0 in Accession #:    [id number]  Weight:       177.0 lb Date of Birth:  Apr 02, 1971   BSA:          1.899 m Patient Age:    52 years    BP:           127/82 mmHg Patient Gender: M           HR:           95 bpm. Exam Location:  [facility] Procedure: 2D Echo and Strain Analysis Indications:     Acute myocardial infarction, unspecified I21.9  History:         Patient has no prior history of Echocardiogram examinations.  Sonographer:     Overton Mam RDCS, FASE Referring Phys:  [phone] DWAYNE D CALLWOOD Diagnosing Phys: Alwyn Pea MD  Sonographer Comments: Global longitudinal strain was attempted. IMPRESSIONS  1. Left ventricular ejection fraction, by estimation, is 60 to 65%. The left ventricle has normal function. The left ventricle has no regional wall motion abnormalities. There is mild concentric left ventricular hypertrophy. Left ventricular diastolic parameters are consistent with Grade I diastolic dysfunction (impaired relaxation).  2. Right ventricular systolic function is normal. The right ventricular size is normal.  3. The mitral valve is normal in structure. Trivial mitral valve regurgitation.  4. The aortic valve is normal in structure. Aortic valve regurgitation is not visualized. FINDINGS  Left Ventricle: Left ventricular ejection fraction, by estimation, is 60 to 65%. The left ventricle has normal function. The left ventricle has no regional wall motion abnormalities. The left ventricular internal cavity size was normal in size. There is  mild concentric left ventricular hypertrophy. Left ventricular diastolic parameters are consistent with Grade I diastolic dysfunction (impaired relaxation). Right Ventricle: The right ventricular size is normal. No increase in right ventricular wall thickness. Right ventricular systolic function is normal. Left Atrium: Left atrial  size was normal in size. Right Atrium: Right atrial size was normal in size. Pericardium: There is no evidence of pericardial effusion. Mitral Valve: The mitral valve is normal in structure. Trivial mitral valve regurgitation. Tricuspid Valve: The tricuspid valve is normal in structure. Tricuspid valve regurgitation is trivial. Aortic Valve: The aortic valve is normal in structure. Aortic valve regurgitation is not visualized. Aortic valve peak gradient measures 8.8 mmHg.  Pulmonic Valve: The pulmonic valve was normal in structure. Pulmonic valve regurgitation is not visualized. Aorta: The ascending aorta was not well visualized. IAS/Shunts: No atrial level shunt detected by color flow Doppler.  LEFT VENTRICLE PLAX 2D LVIDd:         4.00 cm   Diastology LVIDs:         2.60 cm   LV e' medial:    9.90 cm/s LV PW:         1.20 cm   LV E/e' medial:  8.0 LV IVS:        1.30 cm   LV e' lateral:   10.60 cm/s LVOT diam:     2.00 cm   LV E/e' lateral: 7.5 LV SV:         55 LV SV Index:   29 LVOT Area:     3.14 cm  RIGHT VENTRICLE RV Basal diam:  3.50 cm RV S prime:     15.40 cm/s TAPSE (M-mode): 2.1 cm LEFT ATRIUM             Index        RIGHT ATRIUM           Index LA diam:        3.30 cm 1.74 cm/m   RA Area:     15.90 cm LA Vol (A2C):   36.1 ml 19.01 ml/m  RA Volume:   40.30 ml  21.23 ml/m LA Vol (A4C):   34.1 ml 17.96 ml/m LA Biplane Vol: 34.9 ml 18.38 ml/m  AORTIC VALVE                 PULMONIC VALVE AV Area (Vmax): 2.63 cm     PV Vmax:        1.03 m/s AV Vmax:        148.00 cm/s  PV Peak grad:   4.2 mmHg AV Peak Grad:   8.8 mmHg     RVOT Peak grad: 3 mmHg LVOT Vmax:      124.00 cm/s LVOT Vmean:     72.500 cm/s LVOT VTI:       0.176 m  AORTA Ao Root diam: 3.10 cm Ao Asc diam:  3.20 cm MITRAL VALVE               TRICUSPID VALVE MV Area (PHT): 4.19 cm    TV Peak grad:   17.6 mmHg MV Decel Time: 181 msec    TV Vmax:        2.10 m/s MV E velocity: 79.10 cm/s MV A velocity: 83.30 cm/s  SHUNTS MV E/A ratio:  0.95         Systemic VTI:  0.18 m                            Systemic Diam: 2.00 cm Alwyn Pea MD Electronically signed by Alwyn Pea MD Signature Date/Time: 09/10/2023/7:40:54 PM    Final    CT ABDOMEN PELVIS W CONTRAST Result Date: 09/10/2023 CLINICAL DATA:  52 year old male with history of epigastric pain. EXAM: CT ABDOMEN AND PELVIS WITH CONTRAST TECHNIQUE: Multidetector CT imaging of the abdomen and pelvis was performed using the standard protocol following bolus administration of intravenous contrast. RADIATION DOSE REDUCTION: This exam was performed according to the departmental dose-optimization program which includes automated exposure control, adjustment of the mA and/or kV according to patient size and/or use of iterative reconstruction technique. CONTRAST:  80mL  OMNIPAQUE IOHEXOL 300 MG/ML  SOLN COMPARISON:  No priors. FINDINGS: Lower chest: Unremarkable. Hepatobiliary: No suspicious cystic or solid hepatic lesions. Tiny calcified granulomas in the right lobe of the liver incidentally noted. High attenuation material filling the lumen of the gallbladder, indicative of a large volume of biliary sludge and/or vicarious excretion of contrast material related to recent cardiac catheterization. No definite pericholecystic fluid or surrounding inflammatory changes adjacent to the gallbladder. Gallbladder is only moderately distended. Pancreas: No pancreatic mass. No pancreatic ductal dilatation. No pancreatic or peripancreatic fluid collections or inflammatory changes. Spleen: Unremarkable. Adrenals/Urinary Tract: Bilateral kidneys and bilateral adrenal glands are normal in appearance. No hydroureteronephrosis. High attenuation urine within the lumen of the urinary bladder, likely secondary to excretion of contrast material from recent cardiac catheterization. Urinary bladder is otherwise grossly unremarkable in appearance. Stomach/Bowel: The appearance of the stomach is normal. No pathologic  dilatation of small bowel or colon. Normal appendix. Vascular/Lymphatic: Mild atherosclerosis of the abdominal aorta and pelvic vasculature, without evidence of aneurysm or dissection in the abdominal or pelvic vasculature. No lymphadenopathy noted in the abdomen or pelvis. Reproductive: Prostate gland is enlarged measuring 6.2 x 5.6 cm. Other: No significant volume of ascites.  No pneumoperitoneum. Musculoskeletal: There are no aggressive appearing lytic or blastic lesions noted in the visualized portions of the skeleton. IMPRESSION: 1. No acute findings are noted in the abdomen or pelvis to account for the patient's symptoms. 2. Prostatomegaly. 3. High attenuation material in the lumen of the gallbladder, likely vicarious excretion of contrast material and/or biliary sludge. No surrounding inflammatory changes to suggest an acute cholecystitis at this time. 4. Aortic atherosclerosis. Electronically Signed   By: Trudie Reed M.D.   On: 09/10/2023 08:23     ECHO as above  TELEMETRY reviewed by me 09/11/23: sinus tachycardia rate 100s  EKG reviewed by me 09/11/23: Sinus rhythm rate 98 bpm, residual ST elevation in inferior leads  DATA reviewed by me 09/11/23: last 24h vitals tele labs imaging I/O, hospitalist progress note  Principal Problem:   STEMI (ST elevation myocardial infarction) (HCC)    ASSESSMENT AND PLAN: Javier Mongillo is a 52 y.o. male with a past medical history of asthma, hypertension, hyperlipidemia, prediabetes who presented to the ED on 09/09/2023 for chest pain.  Noted to have inferior ST elevation on EKG and was taken emergently to the cardiac Cath Lab.  LHC revealed thrombosis of distal RCA and he underwent stent placement.  Developed ventricular fibrillation while in the lab and was defibrillated.  # STEMI # VF arrest line  # Hypertension # Hyperlipidemia Patient presented to the ED with chest pain on 09/09/2023, EKG concerning for inferior ST elevation.  Patient taken  emergently to cardiac Cath Lab, LHC revealed RCA lesion, coronary thrombosis.  Underwent thrombus extraction.  Had V-fib arrest requiring defibrillation during cardiac cath.  S/p Aggrastat x24 hours.  S/p heparin infusion.  Echo this admission with EF 60-65 %, no wall motion abnormalities, mild concentric LVH, grade 1 diastolic dysfunction. -Repeat LHC today to further assess clot burden and RHC. -Continue Crestor 40 mg daily, aspirin 81 mg daily. -Switch Brilinta to Plavix 75 mg daily. -Continue metoprolol tartrate 50 mg twice daily, losartan 25 mg daily.  This patient's case was discussed and created with Dr. Corky Sing and he is in agreement.  Signed:  Gale Journey, PA-C  09/11/2023, 3:01 PM [facility]

## 2023-09-11 NOTE — TOC CM/SW Note (Signed)
Transition of Care [facility]) - Inpatient Brief Assessment   Patient Details  Name: Brent Oneill MRN: [id number] Date of Birth: Dec 26, 1970  Transition of Care [facility]) CM/SW Contact:    Liliana Cline, LCSW Phone Number: 09/11/2023, 9:10 AM   Clinical Narrative: Please place a TOC consult if TOC needs arise.   Transition of Care Asessment: Insurance and Status: Insurance coverage has been reviewed Patient has primary care physician: Yes     Prior/Current Home Services: No current home services Social Drivers of Health Review: SDOH reviewed no interventions necessary Readmission risk has been reviewed: Yes Transition of care needs: no transition of care needs at this time

## 2023-09-11 NOTE — CV Procedure (Signed)
Post cath note we look from STEMI 48 hours ago  Right radial approach  Left ventricular function mildly reduced with borderline inferior hypo-EF of 50-55  Coronaries Left main large free of disease LAD large free of disease Circumflex large free of disease RCA large 100% occluded proximally TIMI 0 flow acute thrombosis of previously placed stent 48 hours ago and the vessel was full of thrombus Developing significant collaterals left to distal right  After reviewing images and conferring with other interventionalists it was determined it was best not to proceed with any further intervention and treat conservatively and medically the likelihood of significant success was low and the patient was hemodynamically stable and asymptomatic and developing collaterals  Will switch from Brilinta to Plavix continue aspirin beta-blocker and low-dose ARB nitrates statin  Consider discharge within the next 24 to 48 hours follow-up with cardiology as an outpatient

## 2023-09-11 NOTE — Plan of Care (Signed)
  Problem: Activity: Goal: Risk for activity intolerance will decrease Outcome: Progressing   Problem: Nutrition: Goal: Adequate nutrition will be maintained Outcome: Progressing   Problem: Elimination: Goal: Will not experience complications related to bowel motility Outcome: Progressing Goal: Will not experience complications related to urinary retention Outcome: Progressing   

## 2023-09-12 ENCOUNTER — Encounter: Payer: Self-pay | Admitting: Internal Medicine

## 2023-09-12 ENCOUNTER — Other Ambulatory Visit: Payer: BC Managed Care – PPO

## 2023-09-12 DIAGNOSIS — I213 ST elevation (STEMI) myocardial infarction of unspecified site: Secondary | ICD-10-CM | POA: Diagnosis not present

## 2023-09-12 LAB — BASIC METABOLIC PANEL
Anion gap: 5 (ref 5–15)
BUN: 14 mg/dL (ref 6–20)
CO2: 24 mmol/L (ref 22–32)
Calcium: 9 mg/dL (ref 8.9–10.3)
Chloride: 99 mmol/L (ref 98–111)
Creatinine, Ser: 1.06 mg/dL (ref 0.61–1.24)
GFR, Estimated: >60 mL/min (ref >60)
Glucose, Bld: 132 mg/dL — ABNORMAL HIGH (ref 70–99)
Potassium: 4.3 mmol/L (ref 3.5–5.1)
Sodium: 128 mmol/L — ABNORMAL LOW (ref 135–145)

## 2023-09-12 LAB — CBC WITH DIFFERENTIAL/PLATELET
Abs Immature Granulocytes: 0.05 10*3/uL (ref 0.00–0.07)
Basophils Absolute: 0 10*3/uL (ref 0.0–0.1)
Basophils Relative: 0 %
Eosinophils Absolute: 0 10*3/uL (ref 0.0–0.5)
Eosinophils Relative: 0 %
HCT: 39.3 % (ref 39.0–52.0)
Hemoglobin: 13.9 g/dL (ref 13.0–17.0)
Immature Granulocytes: 1 %
Lymphocytes Relative: 9 %
Lymphs Abs: 0.9 10*3/uL (ref 0.7–4.0)
MCH: 27.1 pg (ref 26.0–34.0)
MCHC: 35.4 g/dL (ref 30.0–36.0)
MCV: 76.6 fL — ABNORMAL LOW (ref 80.0–100.0)
Monocytes Absolute: 1.1 10*3/uL — ABNORMAL HIGH (ref 0.1–1.0)
Monocytes Relative: 12 %
Neutro Abs: 7.3 10*3/uL (ref 1.7–7.7)
Neutrophils Relative %: 78 %
Platelets: 192 10*3/uL (ref 150–400)
RBC: 5.13 MIL/uL (ref 4.22–5.81)
RDW: 11.9 % (ref 11.5–15.5)
WBC: 9.4 10*3/uL (ref 4.0–10.5)
nRBC: 0 % (ref 0.0–0.2)

## 2023-09-12 LAB — SODIUM, URINE, RANDOM: Sodium, Ur: 14 mmol/L

## 2023-09-12 LAB — CARDIAC CATHETERIZATION: Cath EF Quantitative: 50 %

## 2023-09-12 MED ORDER — TRAMADOL HCL 50 MG PO TABS
50.0000 mg | ORAL_TABLET | Freq: Four times a day (QID) | ORAL | Status: DC | PRN
  Administered 2023-09-12: 50 mg via ORAL
  Filled 2023-09-12: qty 1

## 2023-09-12 MED ORDER — METFORMIN HCL 500 MG PO TABS
1000.0000 mg | ORAL_TABLET | Freq: Two times a day (BID) | ORAL | Status: DC
  Administered 2023-09-13: 1000 mg via ORAL
  Filled 2023-09-12: qty 2

## 2023-09-12 NOTE — Progress Notes (Signed)
Pt transported to 2A room 260 via WC, accompanied by NT, RN and wife. Bedside update given to [facility].

## 2023-09-12 NOTE — Progress Notes (Signed)
[facility] CARDIOLOGY PROGRESS NOTE   Patient ID: Brent Oneill MRN: [id number] DOB/AGE: 51-Jun-1972 51 y.o.  Admit date: 09/09/2023 Referring Physician None - STEMI pt Primary Physician Sherron Monday, MD  Primary Cardiologist None Reason for Consultation STEMI  HPI: Brent Oneill is a 52 y.o. male with a past medical history of asthma, hypertension, hyperlipidemia, prediabetes who presented to the ED on 09/09/2023 for chest pain.  Noted to have inferior ST elevation on EKG and was taken emergently to the cardiac Cath Lab.  LHC revealed thrombosis of distal RCA and he underwent stent placement.  Developed ventricular fibrillation while in the lab and was defibrillated.  Interval History:  -Patient seen and examined this morning, he is feeling well overall. -Denies any recurrence of chest pain, SOB, indigestion symptoms.  -BP slightly low since yesterday. He is without dizziness.  -HR remains borderline tachycardic.  Review of systems complete and found to be negative unless listed above    Vitals:   09/12/23 0558 09/12/23 0700 09/12/23 0743 09/12/23 0800  BP: 98/66 (!) 87/64 98/63 (!) 88/63  Pulse: (!) 104 (!) 107 (!) 103 (!) 101  Resp: 18 19 18  (!) 24  Temp:      TempSrc:      SpO2: 99% 97% 99% 99%  Weight:      Height:         Intake/Output Summary (Last 24 hours) at 09/12/2023 0900 Last data filed at 09/12/2023 0600 Gross per 24 hour  Intake 1330.51 ml  Output 3500 ml  Net -2169.49 ml     PHYSICAL EXAM General: Well-appearing male, well nourished, in no acute distress sitting upright in hospital bed with wife present at bedside. HEENT: Normocephalic and atraumatic. Neck: No JVD.  Lungs: Normal respiratory effort on room air. Clear bilaterally to auscultation. No wheezes, crackles, rhonchi.  Heart: HRRR, fast rate. Normal S1 and S2 without gallops or murmurs. Radial & DP pulses 2+ bilaterally. Abdomen: Non-distended appearing.  Msk: Normal strength and tone  for age. Extremities: No clubbing, cyanosis or edema.  R radial access site CDI, without any evidence of bleeding/bruising/erythema. Neuro: Alert and oriented X 3. Psych: Mood appropriate, affect congruent.    LABS: Basic Metabolic Panel: Recent Labs    09/09/23 1326 09/09/23 2017 09/10/23 0334 09/11/23 1540 09/12/23 0422  NA  --   --  133* 126* 128*  K  --    < > 3.7 3.9 4.3  CL  --   --  97* 91* 99  CO2  --   --  25 25 24   GLUCOSE  --   --  171* 117* 132*  BUN  --   --  11 9 14   CREATININE  --   --  0.99 1.01 1.06  CALCIUM  --   --  8.7* 9.0 9.0  MG 1.0*  --  2.1  --   --   PHOS 1.2*  --  3.7  --   --    < > = values in this interval not displayed.   Liver Function Tests: Recent Labs    09/09/23 1101  AST 20  ALT 17  ALKPHOS 51  BILITOT 1.1  PROT 7.0  ALBUMIN 4.2   No results for input(s): "LIPASE", "AMYLASE" in the last 72 hours. CBC: Recent Labs    09/09/23 1101 09/09/23 2017 09/11/23 0437 09/12/23 0422  WBC 9.6   < > 11.1* 9.4  NEUTROABS 5.6  --   --  7.3  HGB 14.5   < >  13.7 13.9  HCT 42.6   < > 39.7 39.3  MCV 79.8*   < > 78.8* 76.6*  PLT 218   < > 220 192   < > = values in this interval not displayed.   Cardiac Enzymes: Recent Labs    09/09/23 2307 09/10/23 0711 09/10/23 0905  TROPONINIHS 17,179* 12,533* 13,870*   BNP: No results for input(s): "BNP" in the last 72 hours. D-Dimer: No results for input(s): "DDIMER" in the last 72 hours. Hemoglobin A1C: Recent Labs    09/09/23 1101  HGBA1C 6.8*   Fasting Lipid Panel: Recent Labs    09/09/23 1101  CHOL 237*  HDL 48  LDLCALC 146*  TRIG 215*  CHOLHDL 4.9   Thyroid Function Tests: No results for input(s): "TSH", "T4TOTAL", "T3FREE", "THYROIDAB" in the last 72 hours.  Invalid input(s): "FREET3" Anemia Panel: No results for input(s): "VITAMINB12", "FOLATE", "FERRITIN", "TIBC", "IRON", "RETICCTPCT" in the last 72 hours.  CARDIAC CATHETERIZATION Result Date: 09/12/2023   Prox RCA  lesion is 100% stenosed.   Dist RCA lesion is 100% stenosed.   Mid RCA to Dist RCA lesion is 100% stenosed.   There is mild left ventricular systolic dysfunction.   LV end diastolic pressure is normal.   The left ventricular ejection fraction is 45-50% by visual estimate.   Anticipated discharge date to be determined.   Recommend uninterrupted dual antiplatelet therapy with Aspirin 81mg  daily and Clopidogrel 75mg  daily for a minimum of 6 months (stable ischemic heart disease-Class I recommendation). Conclusion Relook left heart cath 48 hours after complex partially successful STEMI.  Heavy thrombus distal RCA with less than TIMI-3 flow Right radial approach Left ventriculogram borderline normal left ventricular function EF of around 50% with mild inferior hypo- Coronaries Left main large free of disease LAD large free of disease Circumflex large free of disease RCA large 100% occluded proximally.  Reocclusion from previous PCI less than 48 hours ago Heavy thrombus burden Developing moderate collaterals left to distal right After reviewing images and discussing case options with our interventionalists it was determined that further intervention would be unwarranted and the patient should be treated aggressively medically since collaterals are developing and already had been occluded probably over 48 hours since the rest of the event with heavy thrombus decided likelihood of success was low.  Patient was pain-free.  So decision was made to treat the patient medically and no further intervention was undertaken   ECHOCARDIOGRAM COMPLETE Result Date: 09/10/2023    ECHOCARDIOGRAM REPORT   Patient Name:   Brent Oneill Date of Exam: 09/10/2023 Medical Rec #:  [id number]   Height:       66.0 in Accession #:    [id number]  Weight:       177.0 lb Date of Birth:  05/04/1971   BSA:          1.899 m Patient Age:    52 years    BP:           127/82 mmHg Patient Gender: M           HR:           95 bpm. Exam Location:  [facility]  Procedure: 2D Echo and Strain Analysis Indications:     Acute myocardial infarction, unspecified I21.9  History:         Patient has no prior history of Echocardiogram examinations.  Sonographer:     Overton Mam RDCS, FASE Referring Phys:  [phone] DWAYNE D CALLWOOD Diagnosing Phys: Gerda Diss  Salome Arnt MD  Sonographer Comments: Global longitudinal strain was attempted. IMPRESSIONS  1. Left ventricular ejection fraction, by estimation, is 60 to 65%. The left ventricle has normal function. The left ventricle has no regional wall motion abnormalities. There is mild concentric left ventricular hypertrophy. Left ventricular diastolic parameters are consistent with Grade I diastolic dysfunction (impaired relaxation).  2. Right ventricular systolic function is normal. The right ventricular size is normal.  3. The mitral valve is normal in structure. Trivial mitral valve regurgitation.  4. The aortic valve is normal in structure. Aortic valve regurgitation is not visualized. FINDINGS  Left Ventricle: Left ventricular ejection fraction, by estimation, is 60 to 65%. The left ventricle has normal function. The left ventricle has no regional wall motion abnormalities. The left ventricular internal cavity size was normal in size. There is  mild concentric left ventricular hypertrophy. Left ventricular diastolic parameters are consistent with Grade I diastolic dysfunction (impaired relaxation). Right Ventricle: The right ventricular size is normal. No increase in right ventricular wall thickness. Right ventricular systolic function is normal. Left Atrium: Left atrial size was normal in size. Right Atrium: Right atrial size was normal in size. Pericardium: There is no evidence of pericardial effusion. Mitral Valve: The mitral valve is normal in structure. Trivial mitral valve regurgitation. Tricuspid Valve: The tricuspid valve is normal in structure. Tricuspid valve regurgitation is trivial. Aortic Valve: The aortic valve is  normal in structure. Aortic valve regurgitation is not visualized. Aortic valve peak gradient measures 8.8 mmHg. Pulmonic Valve: The pulmonic valve was normal in structure. Pulmonic valve regurgitation is not visualized. Aorta: The ascending aorta was not well visualized. IAS/Shunts: No atrial level shunt detected by color flow Doppler.  LEFT VENTRICLE PLAX 2D LVIDd:         4.00 cm   Diastology LVIDs:         2.60 cm   LV e' medial:    9.90 cm/s LV PW:         1.20 cm   LV E/e' medial:  8.0 LV IVS:        1.30 cm   LV e' lateral:   10.60 cm/s LVOT diam:     2.00 cm   LV E/e' lateral: 7.5 LV SV:         55 LV SV Index:   29 LVOT Area:     3.14 cm  RIGHT VENTRICLE RV Basal diam:  3.50 cm RV S prime:     15.40 cm/s TAPSE (M-mode): 2.1 cm LEFT ATRIUM             Index        RIGHT ATRIUM           Index LA diam:        3.30 cm 1.74 cm/m   RA Area:     15.90 cm LA Vol (A2C):   36.1 ml 19.01 ml/m  RA Volume:   40.30 ml  21.23 ml/m LA Vol (A4C):   34.1 ml 17.96 ml/m LA Biplane Vol: 34.9 ml 18.38 ml/m  AORTIC VALVE                 PULMONIC VALVE AV Area (Vmax): 2.63 cm     PV Vmax:        1.03 m/s AV Vmax:        148.00 cm/s  PV Peak grad:   4.2 mmHg AV Peak Grad:   8.8 mmHg     RVOT Peak grad: 3 mmHg  LVOT Vmax:      124.00 cm/s LVOT Vmean:     72.500 cm/s LVOT VTI:       0.176 m  AORTA Ao Root diam: 3.10 cm Ao Asc diam:  3.20 cm MITRAL VALVE               TRICUSPID VALVE MV Area (PHT): 4.19 cm    TV Peak grad:   17.6 mmHg MV Decel Time: 181 msec    TV Vmax:        2.10 m/s MV E velocity: 79.10 cm/s MV A velocity: 83.30 cm/s  SHUNTS MV E/A ratio:  0.95        Systemic VTI:  0.18 m                            Systemic Diam: 2.00 cm Alwyn Pea MD Electronically signed by Alwyn Pea MD Signature Date/Time: 09/10/2023/7:40:54 PM    Final      ECHO as above  TELEMETRY reviewed by me 09/12/23: sinus tachycardia rate 100s  EKG reviewed by me 09/12/23: Sinus rhythm rate 98 bpm, residual ST elevation  in inferior leads  DATA reviewed by me 09/12/23: last 24h vitals tele labs imaging I/O, hospitalist progress note  Principal Problem:   STEMI (ST elevation myocardial infarction) (HCC)    ASSESSMENT AND PLAN: Brent Oneill is a 52 y.o. male with a past medical history of asthma, hypertension, hyperlipidemia, prediabetes who presented to the ED on 09/09/2023 for chest pain.  Noted to have inferior ST elevation on EKG and was taken emergently to the cardiac Cath Lab.  LHC revealed thrombosis of distal RCA and he underwent stent placement.  Developed ventricular fibrillation while in the lab and was defibrillated.  # STEMI # VF arrest line  # Hypertension # Hyperlipidemia Patient presented to the ED with chest pain on 09/09/2023, EKG concerning for inferior ST elevation.  Patient taken emergently to cardiac Cath Lab, LHC revealed RCA lesion, coronary thrombosis.  Underwent thrombus extraction.  Had V-fib arrest requiring defibrillation during cardiac cath.  S/p Aggrastat x24 hours.  S/p heparin infusion.  Echo this admission with EF 60-65 %, no wall motion abnormalities, mild concentric LVH, grade 1 diastolic dysfunction. Repeat LHC 09/11/23 with proximally occluded RCA developing L to R collaterals.  -Continue Crestor 40 mg daily, aspirin 81 mg daily. -Switch Brilinta to Plavix 75 mg daily. -Discontinue losartan due to hypotension. Continue metoprolol tartrate 50 mg twice daily. -Can transfer out of ICU, anticipate readiness of discharge tomorrow.   This patient's case was discussed and created with Dr. Corky Sing and he is in agreement.  Signed:  Gale Journey, PA-C  09/12/2023, 9:00 AM [facility]

## 2023-09-12 NOTE — Progress Notes (Signed)
Progress Note   Patient: Brent Oneill ZOX:[id number] DOB: 01-22-1971 DOA: 09/09/2023     3 DOS: the patient was seen and examined on 09/12/2023    Brief hospital course: Brent Oneill is a 52 y.o. male with medical history significant for persistent asthma, hypertension, hyperlipidemia, prediabetes, who presented to the hospital because of chest pain and shortness of breath.  He was found to have acute inferior ST elevation MI.  He was transferred to the Cath Lab for emergent left heart catheterization.  He was found to have coronary artery disease in the distal RCA.  According to Dr. Juliann Pares, cardiologist, developed ventricular fibrillation arrest while on the table.  He was treated with defibrillation, IV amiodarone and was started on IV Levophed infusion.  He was also started on tirofiban infusion.  The hospitalist team was consulted to admit the patient for further management.      Assessment and Plan:    Principal Problem:  STEMI (ST elevation myocardial infarction) (HCC)     There is no height or weight on file to calculate BMI.     Inferior STEMI: S/p left heart cath PCI to distal RCA.   Continue metoprolol, rosuvastatin, aspirin and Brilinta.  Plan of care discussed with cardiologist Echo showing EF 60 to 65%   S/p ventricular fibrillation cardiac arrest during left heart cath: S/p defibrillation.  He is off of IV amiodarone and Levophed drip.     Hypokalemia: Improved.  Continue potassium repletion Hypomagnesemia: Improved Hypophosphatemia: Improved     Hyperlipidemia: Continue rosuvastatin Total cholesterol 237, LDL 146, triglycerides 215, HDL 48.  He said he had been prescribed rosuvastatin as an outpatient but he was not taking it regularly.       Hyperglycemia: Hemoglobin A1c of 6.5 on 08/11/2023.  Repeat hemoglobin A1c. 6.8 Continue metformin at discharge     History of hypertension: Hold antihypertensives on account of relative hypotension.     GERD,  epigastric pain (burning sensation): Continue IV Protonix.  Add IV famotidine.  Increased frequency of Maalox.  Morphine as needed for severe pain.     History of moderate persistent asthma: Compensated.  Continue bronchodilators.     Code Status: Full code.   Family Communication: Plan discussed with Ezekiel, son, at the bedside   Disposition Plan: Plan to discharge home   Consults called: Cardiologist   Admission status: Inpatient     Subjective:  Patient denies chest pain nausea vomiting or abdominal pain Has been seen by cardiologist today and being planned for discharge hopefully tomorrow     Physical Exam: Gen: No acute distress. Head: Normocephalic, atraumatic. Eyes: Pupils equal Chest: Lungs are clear to auscultation with good air movement.  CV: Heart sounds are regular with an S1, S2. No murmurs, rubs or gallops.  Abdomen: Soft, nontender, Extremities: Extremities are without clubbing, or cyanosis. No edema. Pedal pulses 2+.  Skin: Warm and dry. No rashes, lesions or wounds Neuro: Alert and oriented times 3; grossly nonfocal.  Psych: Insight is good and judgment is appropriate. Mood and affect normal.    Data Reviewed:     Latest Ref Rng & Units 09/12/2023    4:22 AM 09/11/2023    4:37 AM 09/10/2023    3:34 AM  CBC  WBC 4.0 - 10.5 K/uL 9.4  11.1  10.8   Hemoglobin 13.0 - 17.0 g/dL 81.1  91.4  78.2   Hematocrit 39.0 - 52.0 % 39.3  39.7  35.5   Platelets 150 - 400 K/uL 192  220  186        Latest Ref Rng & Units 09/12/2023    4:22 AM 09/11/2023    3:40 PM 09/10/2023    3:34 AM  BMP  Glucose 70 - 99 mg/dL 161  096  045   BUN 6 - 20 mg/dL 14  9  11    Creatinine 0.61 - 1.24 mg/dL 4.09  8.11  9.14   Sodium 135 - 145 mmol/L 128  126  133   Potassium 3.5 - 5.1 mmol/L 4.3  3.9  3.7   Chloride 98 - 111 mmol/L 99  91  97   CO2 22 - 32 mmol/L 24  25  25    Calcium 8.9 - 10.3 mg/dL 9.0  9.0  8.7     Vitals:   09/12/23 1300 09/12/23 1400 09/12/23 1500  09/12/23 1600  BP: (!) 87/71 93/63 100/76 96/72  Pulse: 99 96 (!) 125 96  Resp: (!) 23   (!) 24  Temp:      TempSrc:      SpO2: 98% 97% 100% 96%  Weight:      Height:        Family Communication: Discussed with patient's wife and son at bedside  Time spent: 61 minutes  Author: Loyce Dys, MD 09/12/2023 6:08 PM  For on call review [email or web address].

## 2023-09-12 NOTE — Progress Notes (Signed)
Report called to Lydia RN.

## 2023-09-12 NOTE — Plan of Care (Signed)
Denies CP today; NSR/ST on monitor. Tolerating metoprolol, but has soft BP's, adequate MAP's. No symptoms of weakness SOB, or other activity intolerance. Ambulated around perimeter of ICU today, up in chair most of day. Discussed importance of physical rehab, antiplatelet medications, and follow up. Pt seems motivated. Works as a Airline pilot, Garment/textile technologist.   Problem: Education: Goal: Knowledge of General Education information will improve Description: Including pain rating scale, medication(s)/side effects and non-pharmacologic comfort measures Outcome: Progressing   Problem: Health Behavior/Discharge Planning: Goal: Ability to manage health-related needs will improve Outcome: Progressing   Problem: Clinical Measurements: Goal: Ability to maintain clinical measurements within normal limits will improve Outcome: Progressing Goal: Will remain free from infection Outcome: Progressing Goal: Diagnostic test results will improve Outcome: Progressing Goal: Respiratory complications will improve Outcome: Progressing Goal: Cardiovascular complication will be avoided Outcome: Progressing   Problem: Activity: Goal: Risk for activity intolerance will decrease Outcome: Progressing   Problem: Nutrition: Goal: Adequate nutrition will be maintained Outcome: Progressing   Problem: Coping: Goal: Level of anxiety will decrease Outcome: Progressing   Problem: Elimination: Goal: Will not experience complications related to bowel motility Outcome: Progressing Goal: Will not experience complications related to urinary retention Outcome: Progressing   Problem: Pain Management: Goal: General experience of comfort will improve Outcome: Progressing   Problem: Safety: Goal: Ability to remain free from injury will improve Outcome: Progressing   Problem: Skin Integrity: Goal: Risk for impaired skin integrity will decrease Outcome: Progressing   Problem: Education: Goal: Understanding of CV  disease, CV risk reduction, and recovery process will improve Outcome: Progressing Goal: Individualized Educational Video(s) Outcome: Progressing   Problem: Activity: Goal: Ability to return to baseline activity level will improve Outcome: Progressing   Problem: Cardiovascular: Goal: Ability to achieve and maintain adequate cardiovascular perfusion will improve Outcome: Progressing Goal: Vascular access site(s) Level 0-1 will be maintained Outcome: Progressing   Problem: Health Behavior/Discharge Planning: Goal: Ability to safely manage health-related needs after discharge will improve Outcome: Progressing

## 2023-09-12 NOTE — Plan of Care (Signed)

## 2023-09-12 NOTE — Plan of Care (Signed)
  Problem: Education: Goal: Knowledge of General Education information will improve Description: Including pain rating scale, medication(s)/side effects and non-pharmacologic comfort measures Outcome: Progressing   Problem: Health Behavior/Discharge Planning: Goal: Ability to manage health-related needs will improve Outcome: Progressing   Problem: Clinical Measurements: Goal: Will remain free from infection Outcome: Progressing   Problem: Clinical Measurements: Goal: Respiratory complications will improve Outcome: Progressing   Problem: Clinical Measurements: Goal: Cardiovascular complication will be avoided Outcome: Progressing   Problem: Activity: Goal: Risk for activity intolerance will decrease Outcome: Progressing   Problem: Pain Management: Goal: General experience of comfort will improve Outcome: Progressing   Problem: Safety: Goal: Ability to remain free from injury will improve Outcome: Progressing   Problem: Activity: Goal: Ability to return to baseline activity level will improve Outcome: Progressing

## 2023-09-13 DIAGNOSIS — I213 ST elevation (STEMI) myocardial infarction of unspecified site: Secondary | ICD-10-CM | POA: Diagnosis not present

## 2023-09-13 LAB — CBC WITH DIFFERENTIAL/PLATELET
Abs Immature Granulocytes: 0.05 10*3/uL (ref 0.00–0.07)
Basophils Absolute: 0 10*3/uL (ref 0.0–0.1)
Basophils Relative: 0 %
Eosinophils Absolute: 0.1 10*3/uL (ref 0.0–0.5)
Eosinophils Relative: 1 %
HCT: 38.9 % — ABNORMAL LOW (ref 39.0–52.0)
Hemoglobin: 13.3 g/dL (ref 13.0–17.0)
Immature Granulocytes: 1 %
Lymphocytes Relative: 14 %
Lymphs Abs: 1.2 10*3/uL (ref 0.7–4.0)
MCH: 27 pg (ref 26.0–34.0)
MCHC: 34.2 g/dL (ref 30.0–36.0)
MCV: 79.1 fL — ABNORMAL LOW (ref 80.0–100.0)
Monocytes Absolute: 1.1 10*3/uL — ABNORMAL HIGH (ref 0.1–1.0)
Monocytes Relative: 13 %
Neutro Abs: 6.1 10*3/uL (ref 1.7–7.7)
Neutrophils Relative %: 71 %
Platelets: 220 10*3/uL (ref 150–400)
RBC: 4.92 MIL/uL (ref 4.22–5.81)
RDW: 11.9 % (ref 11.5–15.5)
WBC: 8.5 10*3/uL (ref 4.0–10.5)
nRBC: 0 % (ref 0.0–0.2)

## 2023-09-13 LAB — BASIC METABOLIC PANEL
Anion gap: 10 (ref 5–15)
BUN: 14 mg/dL (ref 6–20)
CO2: 24 mmol/L (ref 22–32)
Calcium: 9.5 mg/dL (ref 8.9–10.3)
Chloride: 100 mmol/L (ref 98–111)
Creatinine, Ser: 1.25 mg/dL — ABNORMAL HIGH (ref 0.61–1.24)
GFR, Estimated: >60 mL/min (ref >60)
Glucose, Bld: 114 mg/dL — ABNORMAL HIGH (ref 70–99)
Potassium: 4.2 mmol/L (ref 3.5–5.1)
Sodium: 134 mmol/L — ABNORMAL LOW (ref 135–145)

## 2023-09-13 MED ORDER — METFORMIN HCL 1000 MG PO TABS: 1000.0000 mg | ORAL_TABLET | Freq: Two times a day (BID) | ORAL | 10 refills | Status: DC

## 2023-09-13 MED ORDER — ACETAMINOPHEN 325 MG PO TABS: 650.0000 mg | ORAL_TABLET | Freq: Four times a day (QID) | ORAL | 0 refills | Status: AC | PRN

## 2023-09-13 MED ORDER — METOPROLOL TARTRATE 50 MG PO TABS: 50.0000 mg | ORAL_TABLET | Freq: Two times a day (BID) | ORAL | 12 refills | Status: AC

## 2023-09-13 MED ORDER — ASPIRIN 81 MG PO CHEW: 81.0000 mg | CHEWABLE_TABLET | Freq: Every day | ORAL | 11 refills | Status: DC

## 2023-09-13 MED ORDER — ROSUVASTATIN CALCIUM 40 MG PO TABS: 40.0000 mg | ORAL_TABLET | Freq: Every day | ORAL | 11 refills | Status: AC

## 2023-09-13 MED ORDER — CLOPIDOGREL BISULFATE 75 MG PO TABS: 75.0000 mg | ORAL_TABLET | Freq: Every day | ORAL | 11 refills | Status: DC

## 2023-09-13 NOTE — Progress Notes (Signed)
[facility] CARDIOLOGY PROGRESS NOTE   Patient ID: Brent Oneill MRN: [id number] DOB/AGE: 1971/05/04 52 y.o.  Admit date: 09/09/2023 Referring Physician None - STEMI pt Primary Physician Sherron Monday, MD  Primary Cardiologist None Reason for Consultation STEMI  HPI: Anderw Oneill is a 52 y.o. male with a past medical history of asthma, hypertension, hyperlipidemia, prediabetes who presented to the ED on 09/09/2023 for chest pain.  Noted to have inferior ST elevation on EKG and was taken emergently to the cardiac Cath Lab.  LHC revealed thrombosis of distal RCA and he underwent stent placement.  Developed ventricular fibrillation while in the lab and was defibrillated.  Interval History:  -Patient seen and examined this morning, feeling well today with no issues. States he walked around unit yesterday and tolerated this well.  -Denies any recurrence of chest pain, SOB, indigestion symptoms.  -BP remains borderline. He is without dizziness.  -HR mildly improved.  Review of systems complete and found to be negative unless listed above    Vitals:   09/12/23 1825 09/12/23 1949 09/12/23 2329 09/13/23 0309  BP:  106/75 94/65 94/64   Pulse: (!) 116 (!) 102 91 96  Resp: (!) 22 20 20 18   Temp:  98.6 F (37 C) 99 F (37.2 C) 98.3 F (36.8 C)  TempSrc:  Oral Oral Oral  SpO2: 100% 100% 97% 97%  Weight:      Height:         Intake/Output Summary (Last 24 hours) at 09/13/2023 4098 Last data filed at 09/12/2023 2118 Gross per 24 hour  Intake 720 ml  Output --  Net 720 ml     PHYSICAL EXAM General: Well-appearing male, well nourished, in no acute distress resting comfortably in hospital bed with wife present at bedside. HEENT: Normocephalic and atraumatic. Neck: No JVD.  Lungs: Normal respiratory effort on room air. Clear bilaterally to auscultation. No wheezes, crackles, rhonchi.  Heart: HRRR. Normal S1 and S2 without gallops or murmurs. Radial & DP pulses 2+  bilaterally. Abdomen: Non-distended appearing.  Msk: Normal strength and tone for age. Extremities: No clubbing, cyanosis or edema.  R radial access site CDI, without any evidence of bleeding/bruising/erythema. Neuro: Alert and oriented X 3. Psych: Mood appropriate, affect congruent.    LABS: Basic Metabolic Panel: Recent Labs    09/12/23 0422 09/13/23 0613  NA 128* 134*  K 4.3 4.2  CL 99 100  CO2 24 24  GLUCOSE 132* 114*  BUN 14 14  CREATININE 1.06 1.25*  CALCIUM 9.0 9.5   Liver Function Tests: No results for input(s): "AST", "ALT", "ALKPHOS", "BILITOT", "PROT", "ALBUMIN" in the last 72 hours.  No results for input(s): "LIPASE", "AMYLASE" in the last 72 hours. CBC: Recent Labs    09/12/23 0422 09/13/23 0613  WBC 9.4 8.5  NEUTROABS 7.3 6.1  HGB 13.9 13.3  HCT 39.3 38.9*  MCV 76.6* 79.1*  PLT 192 220   Cardiac Enzymes: No results for input(s): "CKTOTAL", "CKMB", "CKMBINDEX", "TROPONINIHS" in the last 72 hours.  BNP: No results for input(s): "BNP" in the last 72 hours. D-Dimer: No results for input(s): "DDIMER" in the last 72 hours. Hemoglobin A1C: No results for input(s): "HGBA1C" in the last 72 hours.  Fasting Lipid Panel: No results for input(s): "CHOL", "HDL", "LDLCALC", "TRIG", "CHOLHDL", "LDLDIRECT" in the last 72 hours.  Thyroid Function Tests: No results for input(s): "TSH", "T4TOTAL", "T3FREE", "THYROIDAB" in the last 72 hours.  Invalid input(s): "FREET3" Anemia Panel: No results for input(s): "VITAMINB12", "FOLATE", "FERRITIN", "TIBC", "  IRON", "RETICCTPCT" in the last 72 hours.  CARDIAC CATHETERIZATION Result Date: 09/12/2023   Prox RCA lesion is 100% stenosed.   Dist RCA lesion is 100% stenosed.   Mid RCA to Dist RCA lesion is 100% stenosed.   There is mild left ventricular systolic dysfunction.   LV end diastolic pressure is normal.   The left ventricular ejection fraction is 45-50% by visual estimate.   Anticipated discharge date to be  determined.   Recommend uninterrupted dual antiplatelet therapy with Aspirin 81mg  daily and Clopidogrel 75mg  daily for a minimum of 6 months (stable ischemic heart disease-Class I recommendation). Conclusion Relook left heart cath 48 hours after complex partially successful STEMI.  Heavy thrombus distal RCA with less than TIMI-3 flow Right radial approach Left ventriculogram borderline normal left ventricular function EF of around 50% with mild inferior hypo- Coronaries Left main large free of disease LAD large free of disease Circumflex large free of disease RCA large 100% occluded proximally.  Reocclusion from previous PCI less than 48 hours ago Heavy thrombus burden Developing moderate collaterals left to distal right After reviewing images and discussing case options with our interventionalists it was determined that further intervention would be unwarranted and the patient should be treated aggressively medically since collaterals are developing and already had been occluded probably over 48 hours since the rest of the event with heavy thrombus decided likelihood of success was low.  Patient was pain-free.  So decision was made to treat the patient medically and no further intervention was undertaken    ECHO as above  TELEMETRY reviewed by me 09/13/23: sinus rhythm rate 90-100s  EKG reviewed by me 09/13/23: Sinus rhythm rate 98 bpm, residual ST elevation in inferior leads  DATA reviewed by me 09/13/23: last 24h vitals tele labs imaging I/O, hospitalist progress note  Principal Problem:   STEMI (ST elevation myocardial infarction) (HCC)    ASSESSMENT AND PLAN: Brent Oneill is a 52 y.o. male with a past medical history of asthma, hypertension, hyperlipidemia, prediabetes who presented to the ED on 09/09/2023 for chest pain.  Noted to have inferior ST elevation on EKG and was taken emergently to the cardiac Cath Lab.  LHC revealed thrombosis of distal RCA and he underwent stent placement.  Developed  ventricular fibrillation while in the lab and was defibrillated.  # STEMI # VF arrest line  # Hypertension # Hyperlipidemia Patient presented to the ED with chest pain on 09/09/2023, EKG concerning for inferior ST elevation.  Patient taken emergently to cardiac Cath Lab, LHC revealed RCA lesion, coronary thrombosis.  Underwent thrombus extraction.  Had V-fib arrest requiring defibrillation during cardiac cath.  S/p Aggrastat x24 hours.  S/p heparin infusion.  Echo this admission with EF 60-65 %, no wall motion abnormalities, mild concentric LVH, grade 1 diastolic dysfunction. Repeat LHC 09/11/23 with proximally occluded RCA developing L to R collaterals.  -Continue Crestor 40 mg daily, aspirin 81 mg daily, plavix 75 mg daily. -Continue metoprolol tartrate 50 mg twice daily.   Ok for discharge today from a cardiac perspective. Will arrange for follow up in clinic with Dr. Corky Sing in 1-2 weeks.    This patient's case was discussed and created with Dr. Corky Sing and he is in agreement.  Signed:  Gale Journey, PA-C  09/13/2023, 9:07 AM [facility]

## 2023-09-13 NOTE — Discharge Summary (Signed)
Physician Discharge Summary   Patient: Brent Oneill MRN: [id number] DOB: Oct 25, 1970  Admit date:     09/09/2023  Discharge date: 09/13/23  Discharge Physician: Loyce Dys   PCP: Sherron Monday, MD   Recommendations at discharge:  Follow-up with cardiology  Discharge Diagnoses: STEMI (ST elevation myocardial infarction) (HCC) Inferior STEMI: S/p left heart cath PCI to distal RCA.   S/p ventricular fibrillation cardiac arrest during left heart cath: S/p defibrillation.   Hypokalemia: Improved.  Continue potassium repletion Hypomagnesemia: Improved Hypophosphatemia: Improved Hyperlipidemia: New diagnosis of diabetes mellitus type 2 with hyperglycemia: Hemoglobin A1c of 6.5 on 08/11/2023.  History of hypertension:  GERD, epigastric pain (burning sensation):  History of moderate persistent asthma  Hospital Course: Brent Oneill is a 52 y.o. male with medical history significant for persistent asthma, hypertension, hyperlipidemia, prediabetes, who presented to the hospital because of chest pain and shortness of breath. He was found to have acute inferior ST elevation MI. He was transferred to the Cath Lab for emergent left heart catheterization. He was found to have coronary artery disease in the distal RCA. According to Dr. Juliann Pares, cardiologist, developed ventricular fibrillation arrest while on the table. He was treated with defibrillation, IV amiodarone and was started on IV Levophed infusion. He was also started on tirofiban infusion. The hospitalist team was consulted to aInferior STEMI: Patient also underwent right heart cath that showed 100% RCA occlusion with significant collaterals and therefore no stent placed.  Patient has been cleared for discharge today and to follow-up with his outpatient physicians.  Consultants: Cardiology Procedures performed: As mentioned above Disposition: Home Diet recommendation:  Cardiac diet DISCHARGE MEDICATION: Allergies as of 09/13/2023        Reactions   Quinine Derivatives Itching        Medication List     STOP taking these medications    chlorthalidone 25 MG tablet Commonly known as: HYGROTON   meloxicam 15 MG tablet Commonly known as: MOBIC       TAKE these medications    acetaminophen 325 MG tablet Commonly known as: TYLENOL Take 2 tablets (650 mg total) by mouth every 6 (six) hours as needed for mild pain (pain score 1-3) (or Fever >/= 101).   aspirin 81 MG chewable tablet Chew 1 tablet (81 mg total) by mouth daily. Start taking on: September 14, 2023   Breo Ellipta 200-25 MCG/ACT Aepb Generic drug: fluticasone furoate-vilanterol INHALE 1 PUFF BY MOUTH ONCE DAILY * NEEDS APPT AND LABS   clopidogrel 75 MG tablet Commonly known as: PLAVIX Take 1 tablet (75 mg total) by mouth daily with breakfast. Start taking on: September 14, 2023   cyclobenzaprine 10 MG tablet Commonly known as: FLEXERIL Take 1 tablet (10 mg total) by mouth 3 (three) times daily as needed for muscle spasms.   fluticasone 50 MCG/ACT nasal spray Commonly known as: FLONASE SPRAY 2 SPRAYS INTO EACH NOSTRIL EVERY DAY   ibuprofen 800 MG tablet Commonly known as: ADVIL Take 1 tablet (800 mg total) by mouth every 8 (eight) hours as needed.   metFORMIN 1000 MG tablet Commonly known as: GLUCOPHAGE Take 1 tablet (1,000 mg total) by mouth 2 (two) times daily with a meal.   metoprolol tartrate 50 MG tablet Commonly known as: LOPRESSOR Take 1 tablet (50 mg total) by mouth 2 (two) times daily.   rosuvastatin 40 MG tablet Commonly known as: CRESTOR Take 1 tablet (40 mg total) by mouth daily. Start taking on: September 14, 2023   tamsulosin 0.4  MG Caps capsule Commonly known as: FLOMAX Take 1 capsule (0.4 mg total) by mouth daily.   Vitamin D (Ergocalciferol) 1.25 MG (50000 UNIT) Caps capsule Commonly known as: DRISDOL Take 50,000 Units by mouth every 7 (seven) days.        Follow-up Information     Alluri, Meryl Dare,  MD. Go in 1 week(s).   Specialty: Cardiology Contact information: [address] [place] [address] [phone]                Discharge Exam: Ceasar Mons Weights   09/11/23 1100 09/11/23 1210  Weight: 81.1 kg 81.1 kg   Gen: No acute distress. Head: Normocephalic, atraumatic. Eyes: Pupils equal Chest: Lungs are clear to auscultation with good air movement.  CV: Heart sounds are regular with an S1, S2. No murmurs, rubs or gallops.  Abdomen: Soft, nontender, Extremities: Extremities are without clubbing, or cyanosis. No edema. Pedal pulses 2+.  Skin: Warm and dry. No rashes, lesions or wounds Neuro: Alert and oriented times 3; grossly nonfocal.  Psych: Insight is good and judgment is appropriate. Mood and affect normal.    Condition at discharge: good  The results of significant diagnostics from this hospitalization (including imaging, microbiology, ancillary and laboratory) are listed below for reference.   Imaging Studies: CARDIAC CATHETERIZATION Result Date: 09/12/2023   Prox RCA lesion is 100% stenosed.   Dist RCA lesion is 100% stenosed.   Mid RCA to Dist RCA lesion is 100% stenosed.   There is mild left ventricular systolic dysfunction.   LV end diastolic pressure is normal.   The left ventricular ejection fraction is 45-50% by visual estimate.   Anticipated discharge date to be determined.   Recommend uninterrupted dual antiplatelet therapy with Aspirin 81mg  daily and Clopidogrel 75mg  daily for a minimum of 6 months (stable ischemic heart disease-Class I recommendation). Conclusion Relook left heart cath 48 hours after complex partially successful STEMI.  Heavy thrombus distal RCA with less than TIMI-3 flow Right radial approach Left ventriculogram borderline normal left ventricular function EF of around 50% with mild inferior hypo- Coronaries Left main large free of disease LAD large free of disease Circumflex large free of disease RCA large 100% occluded proximally.   Reocclusion from previous PCI less than 48 hours ago Heavy thrombus burden Developing moderate collaterals left to distal right After reviewing images and discussing case options with our interventionalists it was determined that further intervention would be unwarranted and the patient should be treated aggressively medically since collaterals are developing and already had been occluded probably over 48 hours since the rest of the event with heavy thrombus decided likelihood of success was low.  Patient was pain-free.  So decision was made to treat the patient medically and no further intervention was undertaken   ECHOCARDIOGRAM COMPLETE Result Date: 09/10/2023    ECHOCARDIOGRAM REPORT   Patient Name:   CHIVAS WATRING Date of Exam: 09/10/2023 Medical Rec #:  [id number]   Height:       66.0 in Accession #:    [id number]  Weight:       177.0 lb Date of Birth:  May 04, 1971   BSA:          1.899 m Patient Age:    52 years    BP:           127/82 mmHg Patient Gender: M           HR:           95 bpm.  Exam Location:  [facility] Procedure: 2D Echo and Strain Analysis Indications:     Acute myocardial infarction, unspecified I21.9  History:         Patient has no prior history of Echocardiogram examinations.  Sonographer:     Overton Mam RDCS, FASE Referring Phys:  [phone] DWAYNE D CALLWOOD Diagnosing Phys: Alwyn Pea MD  Sonographer Comments: Global longitudinal strain was attempted. IMPRESSIONS  1. Left ventricular ejection fraction, by estimation, is 60 to 65%. The left ventricle has normal function. The left ventricle has no regional wall motion abnormalities. There is mild concentric left ventricular hypertrophy. Left ventricular diastolic parameters are consistent with Grade I diastolic dysfunction (impaired relaxation).  2. Right ventricular systolic function is normal. The right ventricular size is normal.  3. The mitral valve is normal in structure. Trivial mitral valve regurgitation.  4. The aortic valve is  normal in structure. Aortic valve regurgitation is not visualized. FINDINGS  Left Ventricle: Left ventricular ejection fraction, by estimation, is 60 to 65%. The left ventricle has normal function. The left ventricle has no regional wall motion abnormalities. The left ventricular internal cavity size was normal in size. There is  mild concentric left ventricular hypertrophy. Left ventricular diastolic parameters are consistent with Grade I diastolic dysfunction (impaired relaxation). Right Ventricle: The right ventricular size is normal. No increase in right ventricular wall thickness. Right ventricular systolic function is normal. Left Atrium: Left atrial size was normal in size. Right Atrium: Right atrial size was normal in size. Pericardium: There is no evidence of pericardial effusion. Mitral Valve: The mitral valve is normal in structure. Trivial mitral valve regurgitation. Tricuspid Valve: The tricuspid valve is normal in structure. Tricuspid valve regurgitation is trivial. Aortic Valve: The aortic valve is normal in structure. Aortic valve regurgitation is not visualized. Aortic valve peak gradient measures 8.8 mmHg. Pulmonic Valve: The pulmonic valve was normal in structure. Pulmonic valve regurgitation is not visualized. Aorta: The ascending aorta was not well visualized. IAS/Shunts: No atrial level shunt detected by color flow Doppler.  LEFT VENTRICLE PLAX 2D LVIDd:         4.00 cm   Diastology LVIDs:         2.60 cm   LV e' medial:    9.90 cm/s LV PW:         1.20 cm   LV E/e' medial:  8.0 LV IVS:        1.30 cm   LV e' lateral:   10.60 cm/s LVOT diam:     2.00 cm   LV E/e' lateral: 7.5 LV SV:         55 LV SV Index:   29 LVOT Area:     3.14 cm  RIGHT VENTRICLE RV Basal diam:  3.50 cm RV S prime:     15.40 cm/s TAPSE (M-mode): 2.1 cm LEFT ATRIUM             Index        RIGHT ATRIUM           Index LA diam:        3.30 cm 1.74 cm/m   RA Area:     15.90 cm LA Vol (A2C):   36.1 ml 19.01 ml/m  RA Volume:    40.30 ml  21.23 ml/m LA Vol (A4C):   34.1 ml 17.96 ml/m LA Biplane Vol: 34.9 ml 18.38 ml/m  AORTIC VALVE  PULMONIC VALVE AV Area (Vmax): 2.63 cm     PV Vmax:        1.03 m/s AV Vmax:        148.00 cm/s  PV Peak grad:   4.2 mmHg AV Peak Grad:   8.8 mmHg     RVOT Peak grad: 3 mmHg LVOT Vmax:      124.00 cm/s LVOT Vmean:     72.500 cm/s LVOT VTI:       0.176 m  AORTA Ao Root diam: 3.10 cm Ao Asc diam:  3.20 cm MITRAL VALVE               TRICUSPID VALVE MV Area (PHT): 4.19 cm    TV Peak grad:   17.6 mmHg MV Decel Time: 181 msec    TV Vmax:        2.10 m/s MV E velocity: 79.10 cm/s MV A velocity: 83.30 cm/s  SHUNTS MV E/A ratio:  0.95        Systemic VTI:  0.18 m                            Systemic Diam: 2.00 cm Alwyn Pea MD Electronically signed by Alwyn Pea MD Signature Date/Time: 09/10/2023/7:40:54 PM    Final    CT ABDOMEN PELVIS W CONTRAST Result Date: 09/10/2023 CLINICAL DATA:  52 year old male with history of epigastric pain. EXAM: CT ABDOMEN AND PELVIS WITH CONTRAST TECHNIQUE: Multidetector CT imaging of the abdomen and pelvis was performed using the standard protocol following bolus administration of intravenous contrast. RADIATION DOSE REDUCTION: This exam was performed according to the departmental dose-optimization program which includes automated exposure control, adjustment of the mA and/or kV according to patient size and/or use of iterative reconstruction technique. CONTRAST:  80mL OMNIPAQUE IOHEXOL 300 MG/ML  SOLN COMPARISON:  No priors. FINDINGS: Lower chest: Unremarkable. Hepatobiliary: No suspicious cystic or solid hepatic lesions. Tiny calcified granulomas in the right lobe of the liver incidentally noted. High attenuation material filling the lumen of the gallbladder, indicative of a large volume of biliary sludge and/or vicarious excretion of contrast material related to recent cardiac catheterization. No definite pericholecystic fluid or surrounding  inflammatory changes adjacent to the gallbladder. Gallbladder is only moderately distended. Pancreas: No pancreatic mass. No pancreatic ductal dilatation. No pancreatic or peripancreatic fluid collections or inflammatory changes. Spleen: Unremarkable. Adrenals/Urinary Tract: Bilateral kidneys and bilateral adrenal glands are normal in appearance. No hydroureteronephrosis. High attenuation urine within the lumen of the urinary bladder, likely secondary to excretion of contrast material from recent cardiac catheterization. Urinary bladder is otherwise grossly unremarkable in appearance. Stomach/Bowel: The appearance of the stomach is normal. No pathologic dilatation of small bowel or colon. Normal appendix. Vascular/Lymphatic: Mild atherosclerosis of the abdominal aorta and pelvic vasculature, without evidence of aneurysm or dissection in the abdominal or pelvic vasculature. No lymphadenopathy noted in the abdomen or pelvis. Reproductive: Prostate gland is enlarged measuring 6.2 x 5.6 cm. Other: No significant volume of ascites.  No pneumoperitoneum. Musculoskeletal: There are no aggressive appearing lytic or blastic lesions noted in the visualized portions of the skeleton. IMPRESSION: 1. No acute findings are noted in the abdomen or pelvis to account for the patient's symptoms. 2. Prostatomegaly. 3. High attenuation material in the lumen of the gallbladder, likely vicarious excretion of contrast material and/or biliary sludge. No surrounding inflammatory changes to suggest an acute cholecystitis at this time. 4. Aortic atherosclerosis. Electronically Signed   By:  Trudie Reed M.D.   On: 09/10/2023 08:23   CARDIAC CATHETERIZATION Result Date: 09/09/2023   Dist RCA lesion is 100% stenosed.   RPAV lesion is 100% stenosed.   A drug-eluting stent was successfully placed using a STENT ONYX FRONTIER 4.0X15 in the main branch.   Angioplasty was performed in the main branch. .   Post intervention, there is a 0%  residual stenosis.   The left ventricular systolic function is normal.   LV end diastolic pressure is normal.   The left ventricular ejection fraction is 55-65% by visual estimate.   There is no mitral valve regurgitation.   Anticipated discharge date to be determined.   Recommend uninterrupted dual antiplatelet therapy with Aspirin 81mg  daily and Ticagrelor 90mg  twice daily for a minimum of 12 months (ACS-Class I recommendation). Conclusion Inferior STEMI presentation via EMS to the emergency room Patient received heparin bolus aspirin Brilinta in the emergency room Right radial approach Left ventriculogram Normal overall left ventricular function of at least 60% normal wall motion Inferior hypo- Coronaries Left main large free of disease LAD very large minor irregularities Circumflex very large minor irregularities RCA very large tortuous distal 100% occlusion IRA TIMI 0 flow Intervention Penumbra was used for thrombus extraction Patient had transient V-fib arrest requiring defibrillation Partially successful PCI stent distal RCA 4.0 x 15 mm frontier Onyx to 12 atm distal RCA Restoration of TIMI-3 flow through the PDA PL had TIMI I flow Significant thrombus ulcerated plaque covered by stent but still very visible Started on Aggrastat for 18 to 24 hours to help with thrombus and clot Patient placed on levo for blood pressure support fluids Making improvement in symptoms significant proved minute pain Case discussed with CM at [facility] STEMI patient on-call to review images for any other recommendations beyond what we have done so for He agreed that it was a good place to stop and treat with Aggrastat to help with thrombus resolution and then reconsider whether we looking next week would be necessary Patient placed on aspirin Brilinta for at least 12 months Patient placed on amnio because of the V-fib arrest Transferred to ICU Anticipate discharge to be determined   Microbiology: Results for orders placed or  performed during the hospital encounter of 09/09/23  MRSA Next Gen by PCR, Nasal     Status: None   Collection Time: 09/09/23  1:25 PM   Specimen: Nasal Mucosa; Nasal Swab  Result Value Ref Range Status   MRSA by PCR Next Gen NOT DETECTED NOT DETECTED Final    Comment: (NOTE) The GeneXpert MRSA Assay (FDA approved for NASAL specimens only), is one component of a comprehensive MRSA colonization surveillance program. It is not intended to diagnose MRSA infection nor to guide or monitor treatment for MRSA infections. Test performance is not FDA approved in patients less than 45 years old. Performed at [facility], [address] [place], [place] [address]     Labs: CBC: Recent Labs  Lab 09/09/23 1101 09/09/23 2017 09/10/23 0334 09/11/23 0437 09/12/23 0422 09/13/23 0613  WBC 9.6  --  10.8* 11.1* 9.4 8.5  NEUTROABS 5.6  --   --   --  7.3 6.1  HGB 14.5  --  12.5* 13.7 13.9 13.3  HCT 42.6  --  35.5* 39.7 39.3 38.9*  MCV 79.8*  --  77.7* 78.8* 76.6* 79.1*  PLT 218 197 186 220 192 220   Basic Metabolic Panel: Recent Labs  Lab 09/09/23 1101 09/09/23 1326 09/09/23 2017  09/10/23 0334 09/11/23 1540 09/12/23 0422 09/13/23 0613  NA 136  --   --  133* 126* 128* 134*  K 2.7*  --  3.9 3.7 3.9 4.3 4.2  CL 97*  --   --  97* 91* 99 100  CO2 25  --   --  25 25 24 24   GLUCOSE 201*  --   --  171* 117* 132* 114*  BUN 15  --   --  11 9 14 14   CREATININE 1.20  --   --  0.99 1.01 1.06 1.25*  CALCIUM 9.5  --   --  8.7* 9.0 9.0 9.5  MG  --  1.0*  --  2.1  --   --   --   PHOS  --  1.2*  --  3.7  --   --   --    Liver Function Tests: Recent Labs  Lab 09/09/23 1101  AST 20  ALT 17  ALKPHOS 51  BILITOT 1.1  PROT 7.0  ALBUMIN 4.2   CBG: Recent Labs  Lab 09/09/23 1302 09/11/23 2111  GLUCAP 185* 164*    Discharge time spent:  33 minutes.  Signed: Loyce Dys, MD [facility] 09/13/2023

## 2023-09-13 NOTE — Discharge Instructions (Signed)
Heart-Healthy Nutrition Therapy  A heart-healthy diet is recommended to reduce your unhealthy blood cholesterol levels, manage high blood pressure, and lower your risk for heart disease. To follow a heart-healthy diet, Eat a balanced diet with whole grains, fruits and vegetables, and lean protein sources. Achieve and maintain a healthy weight. Choose heart-healthy unsaturated fats. Limit saturated fats, trans fats, and cholesterol intake. Eat more plant-based or vegetarian meals using beans and soy foods for protein. Eat whole, unprocessed foods to limit the amount of sodium (salt) you eat. Limit refined carbohydrates especially sugar, sweets and sugar-sweetened beverages. If you drink alcohol, do so in moderation: one serving per day (women) and two servings per day (men). One serving is equivalent to 12 ounces beer, 5 ounces wine, or 1.5 ounces distilled spirits  Tips Tips for Choosing Heart-Healthy Fats Choose lean protein and low-fat dairy foods to reduce saturated fat intake. Saturated fat is usually found in animal-based protein and is associated with certain health risks. Saturated fat is the biggest contributor to raised low-density lipoprotein (LDL) cholesterol levels in the diet. Research shows that limiting saturated fat lowers unhealthy cholesterol levels. Eat no more than 5-6% of your total calories each day from saturated fat. Ask your registered dietitian nutritionist (RDN) to help you determine how much saturated fat is right for you. There are many foods that do not contain large amounts of saturated fats. Swapping these foods to replace foods high in saturated fats will help you limit the saturated fat you eat and improve your cholesterol levels. You can also try eating more plant-based or vegetarian meals. Instead of. Try:  Whole milk, cheese, yogurt, and ice cream 1%, %, or skim milk, low-fat cheese, non-fat yogurt, and low-fat ice cream  Fatty, marbled beef and pork Lean  beef, pork, or venison  Poultry with skin Poultry without skin  Butter, stick margarine Reduced-fat, whipped, or liquid spreads  Coconut oil, palm oil Liquid vegetable oils: corn, canola, olive, soybean and safflower oils   Avoid trans fats. Trans fats increase levels of LDL-cholesterol. Hydrogenated fat in processed foods is the main source of trans fats in foods.  Trans fats can be found in stick margarine, shortening, processed sweets, baked goods, some fried foods, and packaged foods made with hydrogenated oils. Avoid foods with "partially hydrogenated oil" on the ingredient list such as: cookies, pastries, baked goods, biscuits, crackers, microwave popcorn, and frozen dinners. Choose foods with heart healthy fats. Polyunsaturated and monounsaturated fat are unsaturated fats that may help lower your blood cholesterol level when used in place of saturated fat in your diet. Ask your RDN about taking a dietary supplement with plant sterols and stanols to help lower your cholesterol level. Research shows that substituting saturated fats with unsaturated fats is beneficial to cholesterol levels. Try these easy swaps:   Instead of. Try:  Butter, stick margarine, or solid shortening Reduced-fat, whipped, or liquid spreads  Beef, pork, or poultry with skin   Fish and seafood  Chips, crackers, snack foods Raw or unsalted nuts and seeds or nut butters Hummus with vegetables Avocado on toast  Coconut oil, palm oil Liquid vegetable oils: corn, canola, olive, soybean and safflower oils    Limit the amount of cholesterol you eat to less than 200 milligrams per day. Cholesterol is a substance carried through the bloodstream via lipoproteins, which are known as "transporters" of fat. Some body functions need cholesterol to work properly, but too much cholesterol in the bloodstream can damage arteries and build up blood   vessel linings (which can lead to heart attack and stroke). You should eat less than  200 milligrams cholesterol per day. People respond differently to eating cholesterol. There is no test available right now that can figure out which people will respond more to dietary cholesterol and which will respond less. For individuals with high intake of dietary cholesterol, different types of increase (none, small, moderate, large) in LDL-cholesterol levels are all possible.   Food sources of cholesterol include egg yolks and organ meats such as liver, gizzards.  Limit egg yolks to two to four per week and avoid organ meats like liver and gizzards to control cholesterol intake.  Tips for Choosing Heart-Healthy Carbohydrates Consume foods rich in viscous (soluble) fiber Viscous, or soluble, fiber is found in the walls of plant cells. Viscous fiber is found only in plant-based foods--animal-based foods like meat or dairy products do not contain fiber. In the stomach, viscous fibers absorb water and swell to form a thick, jelly-like mass. This helps to lower your unhealthy cholesterol Rich sources of viscous fiber include asparagus, Brussels sprouts, sweet potatoes, turnips, apricots, mangoes, oranges, legumes, barley, oats, and oat bran. Eat at least 5 to 10 grams of viscous fiber each day. As you increase your fiber intake gradually, also increase the amount of water you drink. This will help prevent constipation. If you have difficulty achieving this goal, ask your RDN about fiber laxatives. Choose fiber supplements made with viscous fibers such as psyllium seed husks or methylcellulose to help lower unhealthy cholesterol. Limit refined carbohydrates There are three types of carbohydrates: starches, sugar, and fiber. Some carbohydrates occur naturally in food, like the starches in rice or corn or the sugars in fruits and milk. Refined carbohydrates--foods with high amounts of simple sugars--can raise triglyceride levels. High triglyceride levels are associated with coronary heart disease. Some  examples of refined carbohydrate foods are table sugar, sweets, and beverages sweetened with added sugar. Tips for Reducing Sodium (Salt) Although sodium is important for your body to function, too much sodium can be harmful for people with high blood pressure.  As sodium and fluid buildup in your tissues and bloodstream, your blood pressure increases. High blood pressure may cause damage to other organs and increase your risk for a stroke. Keep your salt intake to 2300 milligrams or less per day. Even if you take a pill for blood pressure or a water pill (diuretic) to remove fluid, it is still important to have less salt in your diet. Ask your RDN what amount of sodium is right for you. Avoid processed foods.  Eat more fresh foods. Fresh fruits and vegetables are naturally low in sodium, as well as frozen vegetables and fruits that have no added juices or sauces. Fresh meats are lower in sodium than processed meats, such as bacon, sausage, and hotdogs.  Read the nutrition label or ask your butcher to help you find a fresh meat that is low in sodium. Eat less salt--at the table and when cooking. A single teaspoon of table salt has 2,300 mg of sodium. Leave the salt out of recipes for pasta, casseroles, and soups. Ask your RDN how to cook your favorite recipes without sodium Be a smart shopper. Look for food packages that say "salt-free" or "sodium-free." These items contain less than 5 milligrams of sodium per serving. "Very low-sodium" products contain less than 35 milligrams of sodium per serving. "Low-sodium" products contain less than 140 milligrams of sodium per serving.  Beware of "reduced salt" or "reduced   sodium" products.  These items may still be high in sodium. Check the nutrition label.  Add flavors to your food without adding sodium. Try lemon juice, lime juice, fruit juice or vinegar.   Dry or fresh herbs add flavor. Try basil, bay leaf, dill, rosemary, parsley, sage, dry mustard,  nutmeg, thyme, and paprika. Pepper, red pepper flakes, and cayenne pepper can add spice to your meals without adding sodium. Hot sauce contains sodium, but if you use just a drop or two, it will not add up to much. Buy a sodium-free seasoning blend or make your own at home.    Additional Lifestyle Tips Achieve and maintain a healthy weight. Talk with your RDN or your doctor about what is a healthy weight for you. Set goals to reach and maintain that weight.  To lose weight, reduce your calorie intake along with increasing your physical activity. A weight loss of 10 to 15 pounds could reduce LDL-cholesterol by 5 milligrams per deciliter. Participate in physical activity. Talk with your health care team to find out what types of physical activity are best for you. Set a plan to get about 30 minutes of exercise on most days.   Foods to Choose or to Limit Food Group Foods to Choose Foods to Limit  Grains Whole grain breads and cereals, including whole wheat, barley, rye, buckwheat, corn, teff, quinoa, millet, amaranth, brown or wild rice, sorghum, and oats Pasta, especially whole wheat or other whole grain types Brown rice, quinoa or wild rice Whole grain crackers, bread, rolls, pitas Home-made bread with reduced-sodium baking soda Breads or crackers topped with salt Cereals (hot or cold) with more than 300 mg sodium per serving Biscuits, cornbread, and other "quick" breads prepared with baking soda Bread crumbs or stuffing mix from a store High-fat bakery products, such as doughnuts, biscuits, croissants, danish pastries, pies, cookies Instant cooking foods to which you add hot water and stir--potatoes, noodles, rice, etc. Packaged starchy foods--seasoned noodle or rice dishes, stuffing mix, macaroni and cheese dinner Snacks made with partially hydrogenated oils, including chips, cheese puffs, snack mixes, regular crackers, butter-flavored popcorn  Protein Foods Lean cuts of beef and pork  (loin, leg, round, extra lean hamburger) Skinless poultry Fish Venison and other wild game Dried beans and peas Nuts and nut butters (unsalted) Seeds and seed butters (unsalted) Meat alternatives made with soy or textured vegetable protein Egg whites or egg substitute Cold cuts made with lean meat or soy protein Higher-fat cuts of meats (ribs, t-bone steak, regular hamburger) Bacon, sausage, or hot dogs Cold cuts, such as salami or bologna, deli meats, cured meats, corned beef Organ meats (liver, brains, gizzards, sweetbreads) Poultry with skin Fried or smoked meat, poultry, and fish Whole eggs and egg yolks (more than 2-4 per week) Salted legumes, nuts, seeds, or nut/seed butters Meat alternatives with high levels of sodium (>300 mg per serving) or saturated fat (>5 g per serving)  Dairy and Dairy Alternatives Nonfat (skim), low-fat, or 1%-fat milk Nonfat or low-fat yogurt or cottage cheese Fat-free and low-fat cheese Fortified non-dairy milk: almond, cashew, pea, and soy Whole milk, 2% fat milk, buttermilk Whole milk yogurt or ice cream Cream, half-&-half Cream cheese Sour cream Cheese  Vegetables Fresh, frozen, or canned vegetables without added fat or salt Avocados Canned or frozen vegetables with salt, fresh vegetables prepared with salt, butter, cheese, or cream sauce Fried vegetables Pickled vegetables such as olives, pickles, or sauerkraut  Fruits Fresh, frozen, canned, or dried fruit Fried fruits Fruits   served with butter or cream  Oils Unsaturated oils (corn, olive, peanut, soy, sunflower, canola) Soft or liquid margarines and vegetable oil spreads Salad dressings made from saturated fats Butter, stick margarine, shortening Partially hydrogenated oils or trans fats Tropical oils (coconut, palm, palm kernel oils)  Other Prepared or homemade foods, including soups, casseroles, and salads made from recommended ingredients and contain <600 mg sodium. Low-sodium seasonings  (ketchup, barbeque sauce) Spices, herbs, Salt-free seasoning mixes and marinades Vinegar Lemon or lime juice Prepared foods, including soups, casseroles, and salads made from recommended ingredients and contain >600 mg sodium. Frozen meals and prepared sides that are >600 mg of sodium Sugary and/or fatty desserts, candy, and other sweets Salts:  sea salt, kosher salt, onion salt, and garlic salt, seasoning mixes containing salt Flavorings: bouillon cubes, catsup or ketchup, barbeque sauce, Worcestershire sauce, soy sauce, salsa, relish, teriyaki sauce   Heart-Healthy Sample 1-Day Menu View Nutrient Info Breakfast 1 cup oatmeal 1 cup fat-free milk 1 cup blueberries 1 ounce almonds, unsalted 1 cup brewed coffee  Lunch 2 slices whole-wheat bread 2 ounces lean turkey breast 1 ounce low-fat Swiss cheese 2 slices tomato 2 lettuce leaves 1 pear 1 cup skim milk  Afternoon Snack 1 ounce trail mix with unsalted nuts, seeds, and raisins  Evening Meal 3 ounces broiled salmon 2/3 cup brown rice 1 teaspoon margarine, soft tub  cup broccoli, cooked  cup carrots, cooked 1 cup tossed salad 1 teaspoon olive oil and vinegar dressing 1 small whole-wheat roll 1 teaspoon margarine, soft tub 1 cup tea  Evening Snack 1 small banana  Daily Sum Nutrient Unit Value  Macronutrients  Energy kcal 2069  Energy kJ 8655  Protein g 146  Total lipid (fat) g 69  Carbohydrate, by difference g 228  Fiber, total dietary g 33  Sugars, total g 85  Minerals  Calcium, Ca mg 1292  Iron, Fe mg 14  Sodium, Na mg 1751  Vitamins  Vitamin C, total ascorbic acid mg 85  Vitamin A, IU IU 17756  Vitamin D IU 231  Lipids  Fatty acids, total saturated g 12  Fatty acids, total monounsaturated g 27  Fatty acids, total polyunsaturated g 24  Cholesterol mg 270     Heart-Healthy Vegan 1-Day Sample Menu View Nutrient Info Breakfast 1 slice whole wheat toast 2 tablespoons peanut butter without salt Tofu scramble  made with:  cup calcium-set tofu  cup green pepper  cup spinach  cup tomatoes  cup white mushrooms 1 teaspoon canola oil 1 cup soymilk fortified with calcium, vitamin B12, and vitamin D  Lunch 1 cup reduced sodium split pea soup 1 whole wheat dinner roll 1 medium apple  Dinner Salad made with: 1 cup lentils  cup cooked broccoli  cup cooked carrots 2 tablespoons hummus 1 cup sliced strawberries 1 cup soymilk fortified with calcium, vitamin B12, and vitamin D  Evening Snack 1 cup soy yogurt  cup mixed nuts  Daily Sum Nutrient Unit Value  Macronutrients  Energy kcal 1672  Energy kJ 7000  Protein g 86  Total lipid (fat) g 65  Carbohydrate, by difference g 205  Fiber, total dietary g 47  Sugars, total g 73  Minerals  Calcium, Ca mg 1443  Iron, Fe mg 19  Sodium, Na mg 1148  Vitamins  Vitamin C, total ascorbic acid mg 253  Vitamin A, IU IU 15451  Vitamin D IU 361  Lipids  Fatty acids, total saturated g 11  Fatty acids, total monounsaturated g   29  Fatty acids, total polyunsaturated g 19  Cholesterol mg 5     Heart-Healthy Vegetarian (Lacto-Ovo) Sample 1-Day Menu View Nutrient Info Breakfast 1 cup cooked oatmeal 1 tablespoon ground flaxseed 1 cup blueberries 2 scrambled egg whites with 1 teaspoon canola oil 2 tablespoons salsa 1 cup fat-free milk 1 cup coffee Oil, canola  Lunch 2 slices whole wheat bread 2 tablespoons peanut butter without salt 1 small banana 6 ounces fat-free plain yogurt 1 cup sliced red pepper 2 tablespoons hummus 1 cup fat-free milk  Evening Meal Stir fry made with:  cup tofu 1 cup brown rice  cup cooked broccoli  cup cooked carrots  cup cooked green beans 1 teaspoon peanut oil  Evening Snack 1 slice low-fat mozzarella cheese 1 medium apple  Daily Sum Nutrient Unit Value  Macronutrients  Energy kcal 1764  Energy kJ 7375  Protein g 87  Total lipid (fat) g 53  Carbohydrate, by difference g 254  Fiber, total dietary g 35   Sugars, total g 98  Minerals  Calcium, Ca mg 1609  Iron, Fe mg 12  Sodium, Na mg 1434  Vitamins  Vitamin C, total ascorbic acid mg 210  Vitamin A, IU IU 16317  Vitamin D IU 234  Lipids  Fatty acids, total saturated g 12  Fatty acids, total monounsaturated g 21  Fatty acids, total polyunsaturated g 15  Cholesterol mg 31    Copyright 2020  [employer]. All rights reserved  

## 2023-09-13 NOTE — Plan of Care (Signed)
Nutrition Education Note  RD consulted for nutrition education regarding a Heart Healthy diet.   Lipid Panel     Component Value Date/Time   CHOL 237 (H) 09/09/2023 1101   CHOL 194 08/11/2023 0948   TRIG 215 (H) 09/09/2023 1101   HDL 48 09/09/2023 1101   HDL 55 08/11/2023 0948   CHOLHDL 4.9 09/09/2023 1101   VLDL 43 (H) 09/09/2023 1101   LDLCALC 146 (H) 09/09/2023 1101   LDLCALC 110 (H) 08/11/2023 4098   Spoke with pt and wife at bedside. Pt pleasant and in good spirits, reports he is ready to go home, but a little nervous about discharge. He shares that he has a good appetite and has been consuming most of the hospital meals.   Per pt, intake is good PTA, however, pt does not follow a structured eating schedule at home. Wife shares that pt generally consumes 1-2 meals per day and pt admits to snacking at night (chips, breads, pudding, and sweets) when he is working late at night on computer. Pt reports most of his meals are homemade and they rarely eat out. Meals consist of a meat, starch, and vegetable- starches and boiled and meats are usually baked in there air fryer. In regards to seasonings, they have been using a lot of premixed seasonings Lowella Curb), however, wife shares she will also purchase bulk spices and crate their own spice mixes at home. Cautioned about premade spice mixes as the primary ingredient is usually salt; discussed options for no salt seasoning mixes and encourage making own spice mixes. Pt also reports "falling off the wagon" over the holidays in terms of diet due to higher accessibility of sweet foods, as pt's son is home from college and enjoys baking.   Beverages consist mainly of tea, water, and coffee.   Pt reports concern over increasing blood sugar and cholesterol levels. Per wife, she wants to ensure that pt optimizes his diet to ensure optimal control. Education focused on importance of decreasing mindless eating/ snacking, low fat and low sodium cooking  methods, and general healthful diet. Pt is unsure of family history- discussed potential genetic component in chronic disease and importance of self-management including regular follow-up with PCP, diet, and taking medications appropriately.   Case discussed with RN and MD. Plan to discharge home today. Pt with questions about which medications he will be taking at home; RN to provide education at discharge. Informed RN and MD that education has been completed prior to discharge.   RD provided "Heart Healthy Nutrition Therapy" handout from the [employer]. Reviewed patient's dietary recall. Provided examples on ways to decrease sodium and fat intake in diet. Discouraged intake of processed foods and use of salt shaker. Encouraged fresh fruits and vegetables as well as whole grain sources of carbohydrates to maximize fiber intake. Teach back method used.  Expect fair to good compliance.  Current diet order is heart healthy, patient is consuming approximately 100% of meals at this time. Labs and medications reviewed. No further nutrition interventions warranted at this time. RD contact information provided. If additional nutrition issues arise, please re-consult RD.  Levada Schilling, RD, LDN, CDCES Registered Dietitian III Certified Diabetes Care and Education Specialist If unable to reach this RD, please use "RD Inpatient" group chat on secure chat between hours of 8am-4 pm daily

## 2023-09-23 ENCOUNTER — Other Ambulatory Visit: Payer: Self-pay | Admitting: Internal Medicine

## 2023-09-23 DIAGNOSIS — J45998 Other asthma: Secondary | ICD-10-CM

## 2023-10-11 ENCOUNTER — Encounter: Payer: 59 | Attending: Internal Medicine | Admitting: *Deleted

## 2023-10-11 ENCOUNTER — Encounter: Payer: Self-pay | Admitting: *Deleted

## 2023-10-11 DIAGNOSIS — Z48812 Encounter for surgical aftercare following surgery on the circulatory system: Secondary | ICD-10-CM | POA: Insufficient documentation

## 2023-10-11 DIAGNOSIS — I252 Old myocardial infarction: Secondary | ICD-10-CM | POA: Insufficient documentation

## 2023-10-11 DIAGNOSIS — I213 ST elevation (STEMI) myocardial infarction of unspecified site: Secondary | ICD-10-CM

## 2023-10-11 NOTE — Progress Notes (Signed)
 Virtual orientation call completed today. he has an appointment on Date: 10/18/2023  for EP eval and gym Orientation.  Documentation of diagnosis can be found in [facility] Date: 09/09/2023 .

## 2023-10-18 VITALS — Ht 66.9 in | Wt 170.0 lb

## 2023-10-18 DIAGNOSIS — Z48812 Encounter for surgical aftercare following surgery on the circulatory system: Secondary | ICD-10-CM | POA: Diagnosis present

## 2023-10-18 DIAGNOSIS — I213 ST elevation (STEMI) myocardial infarction of unspecified site: Secondary | ICD-10-CM | POA: Diagnosis present

## 2023-10-18 DIAGNOSIS — I252 Old myocardial infarction: Secondary | ICD-10-CM | POA: Diagnosis not present

## 2023-10-18 NOTE — Progress Notes (Signed)
Cardiac Individual Treatment Plan  Patient Details  Name: Brent Oneill MRN: [id number] Date of Birth: [birth year] Referring Provider:   Flowsheet Row Cardiac Rehab from 10/18/2023 in [facility] Cardiac and Pulmonary Rehab  Referring Provider Dr. Windell Norfolk       Initial Encounter Date:  Flowsheet Row Cardiac Rehab from 10/18/2023 in [facility] Cardiac and Pulmonary Rehab  Date 10/18/23       Visit Diagnosis: ST elevation myocardial infarction (STEMI), unspecified artery (HCC)  Patient's Home Medications on Admission:  Current Outpatient Medications:    acetaminophen (TYLENOL) 325 MG tablet, Take 2 tablets (650 mg total) by mouth every 6 (six) hours as needed for mild pain (pain score 1-3) (or Fever >/= 101)., Disp: 30 tablet, Rfl: 0   albuterol (VENTOLIN HFA) 108 (90 Base) MCG/ACT inhaler, Inhale 2 puffs into the lungs every 6 (six) hours as needed for wheezing or shortness of breath., Disp: , Rfl:    aspirin 81 MG chewable tablet, Chew 1 tablet (81 mg total) by mouth daily., Disp: 30 tablet, Rfl: 11   BREO ELLIPTA 200-25 MCG/ACT AEPB, INHALE 1 PUFF BY MOUTH ONCE DAILY. *NEEDS APPT AND LABS*, Disp: 60 each, Rfl: 1   clopidogrel (PLAVIX) 75 MG tablet, Take 1 tablet (75 mg total) by mouth daily with breakfast., Disp: 90 tablet, Rfl: 11   cyclobenzaprine (FLEXERIL) 10 MG tablet, Take 1 tablet (10 mg total) by mouth 3 (three) times daily as needed for muscle spasms., Disp: 30 tablet, Rfl: 0   fluticasone (FLONASE) 50 MCG/ACT nasal spray, SPRAY 2 SPRAYS INTO EACH NOSTRIL EVERY DAY, Disp: 48 mL, Rfl: 3   ibuprofen (ADVIL) 800 MG tablet, Take 1 tablet (800 mg total) by mouth every 8 (eight) hours as needed. (Patient not taking: Reported on 10/11/2023), Disp: 30 tablet, Rfl: 0   metFORMIN (GLUCOPHAGE) 1000 MG tablet, Take 1 tablet (1,000 mg total) by mouth 2 (two) times daily with a meal., Disp: 60 tablet, Rfl: 10   metoprolol tartrate (LOPRESSOR) 50 MG tablet, Take 1 tablet (50 mg total) by mouth 2 (two)  times daily., Disp: 60 tablet, Rfl: 12   rosuvastatin (CRESTOR) 40 MG tablet, Take 1 tablet (40 mg total) by mouth daily., Disp: 30 tablet, Rfl: 11   tamsulosin (FLOMAX) 0.4 MG CAPS capsule, Take 1 capsule (0.4 mg total) by mouth daily., Disp: 90 capsule, Rfl: 3   Vitamin D, Ergocalciferol, (DRISDOL) 1.25 MG (50000 UNIT) CAPS capsule, Take 50,000 Units by mouth every 7 (seven) days. (Patient not taking: Reported on 10/11/2023), Disp: , Rfl:   Past Medical History: Past Medical History:  Diagnosis Date   Allergic rhinitis 03/14/2016   Asthma, persistent 03/14/2016    Tobacco Use: Social History   Tobacco Use  Smoking Status Never  Smokeless Tobacco Never    Labs: Review Flowsheet       Latest Ref Rng & Units 04/21/2023 08/11/2023 09/09/2023  Labs for ITP Cardiac and Pulmonary Rehab  Cholestrol 0 - 200 mg/dL 295  621  308   LDL (calc) 0 - 99 mg/dL 657  846  962   HDL-C >95 mg/dL 45  55  48   Trlycerides <150 mg/dL 284  132  440   Hemoglobin A1c 4.8 - 5.6 % - 6.4  6.8      Exercise Target Goals: Exercise Program Goal: Individual exercise prescription set using results from initial 6 min walk test and THRR while considering  patient's activity barriers and safety.   Exercise Prescription Goal: Initial exercise prescription builds  to 30-45 minutes a day of aerobic activity, 2-3 days per week.  Home exercise guidelines will be given to patient during program as part of exercise prescription that the participant will acknowledge.   Education: Aerobic Exercise: - Group verbal and visual presentation on the components of exercise prescription. Introduces F.I.T.T principle from ACSM for exercise prescriptions.  Reviews F.I.T.T. principles of aerobic exercise including progression. Written material given at graduation.   Education: Resistance Exercise: - Group verbal and visual presentation on the components of exercise prescription. Introduces F.I.T.T principle from ACSM for exercise  prescriptions  Reviews F.I.T.T. principles of resistance exercise including progression. Written material given at graduation.    Education: Exercise & Equipment Safety: - Individual verbal instruction and demonstration of equipment use and safety with use of the equipment. Flowsheet Row Cardiac Rehab from 10/18/2023 in [facility] Cardiac and Pulmonary Rehab  Date 10/18/23  Educator MB  Instruction Review Code 1- Verbalizes Understanding       Education: Exercise Physiology & General Exercise Guidelines: - Group verbal and written instruction with models to review the exercise physiology of the cardiovascular system and associated critical values. Provides general exercise guidelines with specific guidelines to those with heart or lung disease.    Education: Flexibility, Balance, Mind/Body Relaxation: - Group verbal and visual presentation with interactive activity on the components of exercise prescription. Introduces F.I.T.T principle from ACSM for exercise prescriptions. Reviews F.I.T.T. principles of flexibility and balance exercise training including progression. Also discusses the mind body connection.  Reviews various relaxation techniques to help reduce and manage stress (i.e. Deep breathing, progressive muscle relaxation, and visualization). Balance handout provided to take home. Written material given at graduation.   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 HR 105 bpm     Max Ex. BP 114/70     2 Minute Post BP 108/76              Oxygen Initial Assessment:   Oxygen Re-Evaluation:   Oxygen Discharge (Final Oxygen Re-Evaluation):   Initial Exercise Prescription:  Initial Exercise Prescription - 10/18/23 1500       Date of Initial Exercise RX and Referring Provider   Date 10/18/23    Referring Provider Dr. Windell Norfolk      Oxygen   Maintain Oxygen Saturation 88% or higher      Treadmill   MPH  2.8    Grade 0.5    Minutes 15    METs 3.34      NuStep   Level 4    SPM 80    Minutes 15    METs 4.12      Elliptical   Level 1    Speed 3    Minutes 15    METs 4.12      REL-XR   Level 3    Speed 50    Minutes 15    METs 4.12      T5 Nustep   Level 4   T6   SPM 80    Minutes 15    METs 4.12      Prescription Details   Frequency (times per week) 3    Duration Progress to 30 minutes of continuous aerobic without signs/symptoms of physical distress      Intensity   THRR 40-80% of Max Heartrate 118-151    Ratings of Perceived Exertion 11-13    Perceived Dyspnea 0-4      Progression   Progression Continue to progress workloads to maintain intensity without signs/symptoms of physical distress.      Resistance Training   Training Prescription Yes    Weight 5 lb    Reps 10-15             Perform Capillary Blood Glucose checks as needed.  Exercise Prescription Changes:   Exercise Prescription Changes     Row Name 10/18/23 1500             Response to Exercise   Blood Pressure (Admit) 108/74       Blood Pressure (Exercise) 114/70       Blood Pressure (Exit) 108/76       Heart Rate (Admit) 91 bpm       Heart Rate (Exercise) 105 bpm       Heart Rate (Exit) 85 bpm       Oxygen  Saturation (Admit) 96 %       Oxygen Saturation (Exercise) 95 %       Oxygen Saturation (Exit) 98 %       Rating of Perceived Exertion (Exercise) 11       Perceived Dyspnea (Exercise) 0       Symptoms none       Comments results       Duration Progress to 30 minutes of  aerobic without signs/symptoms of physical distress       Intensity THRR New         Progression   Progression Continue to progress workloads to maintain intensity without signs/symptoms of physical distress.       Average METs 4.12                Exercise Comments:   Exercise Goals and Review:   Exercise Goals     Row Name 10/18/23 1525             Exercise Goals   Increase Physical Activity Yes       Intervention Provide advice, education, support and counseling about physical activity/exercise needs.;Develop an individualized exercise prescription for aerobic and resistive training based on initial evaluation findings, risk stratification, comorbidities and participant's personal goals.       Expected Outcomes Short Term: Attend rehab on a regular basis to increase amount of physical activity.;Long Term: Add in home exercise to make exercise part of routine and to increase amount of physical activity.;Long Term: Exercising  regularly at least 3-5 days a week.       Increase Strength and Stamina Yes       Intervention Provide advice, education, support and counseling about physical activity/exercise needs.;Develop an individualized exercise prescription for aerobic and resistive training based on initial evaluation findings, risk stratification, comorbidities and participant's personal goals.       Expected Outcomes Short Term: Increase workloads from initial exercise prescription for resistance, speed, and METs.;Short Term: Perform resistance training exercises routinely during rehab and add in resistance training at home;Long Term: Improve cardiorespiratory fitness, muscular endurance and strength as  measured by increased METs and functional capacity ( )       Able to understand and use rate of perceived exertion (RPE) scale Yes       Intervention Provide education and explanation on how to use RPE scale       Expected Outcomes Short Term: Able to use RPE daily in rehab to express subjective intensity level;Long Term:  Able to use RPE to guide intensity level when exercising independently       Able to understand and use Dyspnea scale Yes       Intervention Provide education and explanation on how to use Dyspnea scale       Expected Outcomes Short Term: Able to use Dyspnea scale daily in rehab to express subjective sense of shortness of breath during exertion;Long Term: Able to use Dyspnea scale to guide intensity level when exercising independently       Knowledge and understanding of Target Heart Rate Range (THRR) Yes       Intervention Provide education and explanation of THRR including how the numbers were predicted and where they are located for reference       Expected Outcomes Short Term: Able to state/look up THRR;Short Term: Able to use daily as guideline for intensity in rehab;Long Term: Able to use THRR to govern intensity when exercising independently       Able to check pulse independently Yes       Intervention Provide education and demonstration on how to check pulse in carotid and radial arteries.;Review the importance of being able to check your own pulse for safety during independent exercise       Expected Outcomes Short Term: Able to explain why pulse checking is important during independent exercise;Long Term: Able to check pulse independently and accurately       Understanding of Exercise Prescription Yes       Intervention Provide education, explanation, and written materials on patient's individual exercise prescription       Expected Outcomes Short Term: Able to explain program exercise prescription;Long Term: Able to explain home exercise prescription to exercise  independently                Exercise Goals Re-Evaluation :   Discharge Exercise Prescription (Final Exercise Prescription Changes):  Exercise Prescription Changes - 10/18/23 1500       Response to Exercise   Blood Pressure (Admit) 108/74    Blood Pressure (Exercise) 114/70    Blood Pressure (Exit) 108/76    Heart Rate (Admit) 91 bpm    Heart Rate (Exercise) 105 bpm    Heart Rate (Exit) 85 bpm    Oxygen Saturation (Admit) 96 %    Oxygen Saturation (Exercise) 95 %    Oxygen Saturation (Exit) 98 %    Rating of Perceived Exertion (Exercise) 11    Perceived Dyspnea (Exercise) 0    Symptoms none  Comments results    Duration Progress to 30 minutes of  aerobic without signs/symptoms of physical distress    Intensity THRR New      Progression   Progression Continue to progress workloads to maintain intensity without signs/symptoms of physical distress.    Average METs 4.12             Nutrition:  Target Goals: Understanding of nutrition guidelines, daily intake of sodium 1500mg , cholesterol 200mg , calories 30% from fat and 7% or less from saturated fats, daily to have 5 or more servings of fruits and vegetables.  Education: All About Nutrition: -Group instruction provided by verbal, written material, interactive activities, discussions, models, and posters to present general guidelines for heart healthy nutrition including fat, fiber, MyPlate, the role of sodium in heart healthy nutrition, utilization of the nutrition label, and utilization of this knowledge for meal planning. Follow up email sent as well. Written material given at graduation.   Biometrics:  Pre Biometrics - 10/18/23 1526       Pre Biometrics   Height 5' 6.9" (1.699 m)    Weight 170 lb (77.1 kg)    Waist Circumference 36 inches    Hip Circumference 35.5 inches    Waist to Hip Ratio 1.01 %    BMI (Calculated) 26.71    Single Leg Stand 30 seconds              Nutrition Therapy Plan  and Nutrition Goals:  Nutrition Therapy & Goals - 10/18/23 1529       Personal Nutrition Goals   Nutrition Goal Will meet with RD on 1/27      Intervention Plan   Intervention Prescribe, educate and counsel regarding individualized specific dietary modifications aiming towards targeted core components such as weight, hypertension, lipid management, diabetes, heart failure and other comorbidities.    Expected Outcomes Short Term Goal: Understand basic principles of dietary content, such as calories, fat, sodium, cholesterol and nutrients.             Nutrition Assessments:  MEDIFICTS Score Key: >=70 Need to make dietary changes  40-70 Heart Healthy Diet <= 40 Therapeutic Level Cholesterol Diet  Flowsheet Row Cardiac Rehab from 10/18/2023 in [facility] Cardiac and Pulmonary Rehab  Picture Your Plate Total Score on Admission 67      Picture Your Plate Scores: <09 Unhealthy dietary pattern with much room for improvement. 41-50 Dietary pattern unlikely to meet recommendations for good health and room for improvement. 51-60 More healthful dietary pattern, with some room for improvement.  >60 Healthy dietary pattern, although there may be some specific behaviors that could be improved.    Nutrition Goals Re-Evaluation:   Nutrition Goals Discharge (Final Nutrition Goals Re-Evaluation):   Psychosocial: Target Goals: Acknowledge presence or absence of significant depression and/or stress, maximize coping skills, provide positive support system. Participant is able to verbalize types and ability to use techniques and skills needed for reducing stress and depression.   Education: Stress, Anxiety, and Depression - Group verbal and visual presentation to define topics covered.  Reviews how body is impacted by stress, anxiety, and depression.  Also discusses healthy ways to reduce stress and to treat/manage anxiety and depression.  Written material given at graduation.   Education: Sleep  Hygiene -Provides group verbal and written instruction about how sleep can affect your health.  Define sleep hygiene, discuss sleep cycles and impact of sleep habits. Review good sleep hygiene tips.    Initial Review & Psychosocial Screening:  Initial Psych Review & Screening - 10/11/23 1428       Initial Review   Current issues with None Identified      Family Dynamics   Good Support System? Yes   wife daughter and son     Barriers   Psychosocial barriers to participate in program There are no identifiable barriers or psychosocial needs.      Screening Interventions   Interventions To provide support and resources with identified psychosocial needs;Provide feedback about the scores to participant    Expected Outcomes Short Term goal: Utilizing psychosocial counselor, staff and physician to assist with identification of specific Stressors or current issues interfering with healing process. Setting desired goal for each stressor or current issue identified.;Long Term Goal: Stressors or current issues are controlled or eliminated.;Short Term goal: Identification and review with participant of any Quality of Life or Depression concerns found by scoring the questionnaire.;Long Term goal: The participant improves quality of Life and PHQ9 Scores as seen by post scores and/or verbalization of changes             Quality of Life Scores:   Quality of Life - 10/18/23 1531       Quality of Life   Select Quality of Life      Quality of Life Scores   Health/Function Pre 26 %    Socioeconomic Pre 24.75 %    Psych/Spiritual Pre 28.29 %    Family Pre 30 %    GLOBAL Pre 26.74 %            Scores of 19 and below usually indicate a poorer quality of life in these areas.  A difference of  2-3 points is a clinically meaningful difference.  A difference of 2-3 points in the total score of the Quality of Life Index has been associated with significant improvement in overall quality of life,  self-image, physical symptoms, and general health in studies assessing change in quality of life.  PHQ-9: Review Flowsheet       10/18/2023 08/25/2023  Depression screen PHQ 2/9  Decreased Interest 0 0  Down, Depressed, Hopeless 0 0  PHQ - 2 Score 0 0  Altered sleeping 0 0  Tired, decreased energy 2 1  Change in appetite 0 0  Feeling bad or failure about yourself  0 0  Trouble concentrating 1 0  Moving slowly or fidgety/restless 0 0  Suicidal thoughts 0 0  PHQ-9 Score 3 1  Difficult doing work/chores Somewhat difficult -   Interpretation of Total Score  Total Score Depression Severity:  1-4 = Minimal depression, 5-9 = Mild depression, 10-14 = Moderate depression, 15-19 = Moderately severe depression, 20-27 = Severe depression   Psychosocial Evaluation and Intervention:  Psychosocial Evaluation - 10/11/23 1441       Psychosocial Evaluation & Interventions   Interventions Encouraged to exercise with the program and follow exercise prescription    Comments ther are no barriers to attending the program.  He lives with his wife,daughter and son.  THey are his support team. He is ready to start the program amd meet with the RD to work on a  heart healthy weight loss plan.    Expected Outcomes STG attends all scheduled sessions, works on exericse progression and nutrition goals LTG able to continue with exercise progression and nutrition goals after discharge    Continue Psychosocial Services  Follow up required by staff             Psychosocial Re-Evaluation:  Psychosocial Discharge (Final Psychosocial Re-Evaluation):   Vocational Rehabilitation: Provide vocational rehab assistance to qualifying candidates.   Vocational Rehab Evaluation & Intervention:  Vocational Rehab - 10/11/23 1431       Initial Vocational Rehab Evaluation & Intervention   Assessment shows need for Vocational Rehabilitation No             Education: Education Goals: Education classes will  be provided on a variety of topics geared toward better understanding of heart health and risk factor modification. Participant will state understanding/return demonstration of topics presented as noted by education test scores.  Learning Barriers/Preferences:  Learning Barriers/Preferences - 10/11/23 1429       Learning Barriers/Preferences   Learning Barriers None    Learning Preferences None             General Cardiac Education Topics:  AED/CPR: - Group verbal and written instruction with the use of models to demonstrate the basic use of the AED with the basic ABC's of resuscitation.   Anatomy and Cardiac Procedures: - Group verbal and visual presentation and models provide information about basic cardiac anatomy and function. Reviews the testing methods done to diagnose heart disease and the outcomes of the test results. Describes the treatment choices: Medical Management, Angioplasty, or Coronary Bypass Surgery for treating various heart conditions including Myocardial Infarction, Angina, Valve Disease, and Cardiac Arrhythmias.  Written material given at graduation.   Medication Safety: - Group verbal and visual instruction to review commonly prescribed medications for heart and lung disease. Reviews the medication, class of the drug, and side effects. Includes the steps to properly store meds and maintain the prescription regimen.  Written material given at graduation.   Intimacy: - Group verbal instruction through game format to discuss how heart and lung disease can affect sexual intimacy. Written material given at graduation..   Know Your Numbers and Heart Failure: - Group verbal and visual instruction to discuss disease risk factors for cardiac and pulmonary disease and treatment options.  Reviews associated critical values for Overweight/Obesity, Hypertension, Cholesterol, and Diabetes.  Discusses basics of heart failure: signs/symptoms and treatments.  Introduces Heart  Failure Zone chart for action plan for heart failure.  Written material given at graduation.   Infection Prevention: - Provides verbal and written material to individual with discussion of infection control including proper hand washing and proper equipment cleaning during exercise session. Flowsheet Row Cardiac Rehab from 10/18/2023 in [facility] Cardiac and Pulmonary Rehab  Date 10/18/23  Educator MB  Instruction Review Code 1- Verbalizes Understanding       Falls Prevention: - Provides verbal and written material to individual with discussion of falls prevention and safety. Flowsheet Row Cardiac Rehab from 10/18/2023 in [facility] Cardiac and Pulmonary Rehab  Date 10/18/23  Educator MB  Instruction Review Code 1- Verbalizes Understanding       Other: -Provides group and verbal instruction on various topics (see comments)   Knowledge Questionnaire Score:   Core Components/Risk Factors/Patient Goals at Admission:  Personal Goals and Risk Factors at Admission - 10/18/23 1533       Core Components/Risk Factors/Patient Goals on Admission    Weight Management Yes;Weight Loss    Intervention Weight Management: Develop a combined nutrition and exercise program designed to reach desired caloric intake, while maintaining appropriate intake of nutrient and fiber, sodium and fats, and appropriate energy expenditure required for the weight goal.;Weight Management: Provide education and appropriate resources to help participant work on and attain dietary goals.;Weight Management/Obesity: Establish reasonable short  term and long term weight goals.    Admit Weight 170 lb (77.1 kg)    Goal Weight: Short Term 160 lb (72.6 kg)    Goal Weight: Long Term 150 lb (68 kg)    Expected Outcomes Short Term: Continue to assess and modify interventions until short term weight is achieved;Long Term: Adherence to nutrition and physical activity/exercise program aimed toward attainment of established weight goal;Weight  Loss: Understanding of general recommendations for a balanced deficit meal plan, which promotes 1-2 lb weight loss per week and includes a negative energy balance of [id number] kcal/d;Understanding recommendations for meals to include 15-35% energy as protein, 25-35% energy from fat, 35-60% energy from carbohydrates, less than 200mg  of dietary cholesterol, 20-35 gm of total fiber daily;Understanding of distribution of calorie intake throughout the day with the consumption of 4-5 meals/snacks    Diabetes Yes    Intervention Provide education about signs/symptoms and action to take for hypo/hyperglycemia.;Provide education about proper nutrition, including hydration, and aerobic/resistive exercise prescription along with prescribed medications to achieve blood glucose in normal ranges: Fasting glucose 65-99 mg/dL    Expected Outcomes Short Term: Participant verbalizes understanding of the signs/symptoms and immediate care of hyper/hypoglycemia, proper foot care and importance of medication, aerobic/resistive exercise and nutrition plan for blood glucose control.;Long Term: Attainment of HbA1C < 7%.    Hypertension Yes    Intervention Provide education on lifestyle modifcations including regular physical activity/exercise, weight management, moderate sodium restriction and increased consumption of fresh fruit, vegetables, and low fat dairy, alcohol moderation, and smoking cessation.;Monitor prescription use compliance.    Expected Outcomes Short Term: Continued assessment and intervention until BP is < 140/76mm HG in hypertensive participants. < 130/70mm HG in hypertensive participants with diabetes, heart failure or chronic kidney disease.;Long Term: Maintenance of blood pressure at goal levels.    Lipids Yes    Intervention Provide education and support for participant on nutrition & aerobic/resistive exercise along with prescribed medications to achieve LDL 70mg , HDL >40mg .    Expected Outcomes Short Term:  Participant states understanding of desired cholesterol values and is compliant with medications prescribed. Participant is following exercise prescription and nutrition guidelines.;Long Term: Cholesterol controlled with medications as prescribed, with individualized exercise RX and with personalized nutrition plan. Value goals: LDL < 70mg , HDL > 40 mg.             Education:Diabetes - Individual verbal and written instruction to review signs/symptoms of diabetes, desired ranges of glucose level fasting, after meals and with exercise. Acknowledge that pre and post exercise glucose checks will be done for 3 sessions at entry of program.   Core Components/Risk Factors/Patient Goals Review:    Core Components/Risk Factors/Patient Goals at Discharge (Final Review):    ITP Comments:  ITP Comments     Row Name 10/11/23 1450 10/18/23 1517         ITP Comments Virtual orientation call completed today. he has an appointment on Date: 10/18/2023  for EP eval and gym Orientation.  Documentation of diagnosis can be found in [facility] Date: 09/09/2023 . Completed and gym orientation. Initial ITP created and sent for review to Dr. Bethann Punches, Medical Director.               Comments: Initial ITP

## 2023-10-18 NOTE — Patient Instructions (Signed)
Patient Instructions  Patient Details  Name: Brent Oneill MRN: [id number] Date of Birth: 1971-06-22 Referring Provider:  Alwyn Pea, MD  Below are your personal goals for exercise, nutrition, and risk factors. Our goal is to help you stay on track towards obtaining and maintaining these goals. We will be discussing your progress on these goals with you throughout the program.  Initial Exercise Prescription:  Initial Exercise Prescription - 10/18/23 1500       Date of Initial Exercise RX and Referring Provider   Date 10/18/23    Referring Provider Dr. Windell Norfolk      Oxygen   Maintain Oxygen Saturation 88% or higher      Treadmill   MPH 2.8    Grade 0.5    Minutes 15    METs 3.34      NuStep   Level 4    SPM 80    Minutes 15    METs 4.12      Elliptical   Level 1    Speed 3    Minutes 15    METs 4.12      REL-XR   Level 3    Speed 50    Minutes 15    METs 4.12      T5 Nustep   Level 4   T6   SPM 80    Minutes 15    METs 4.12      Prescription Details   Frequency (times per week) 3    Duration Progress to 30 minutes of continuous aerobic without signs/symptoms of physical distress      Intensity   THRR 40-80% of Max Heartrate 118-151    Ratings of Perceived Exertion 11-13    Perceived Dyspnea 0-4      Progression   Progression Continue to progress workloads to maintain intensity without signs/symptoms of physical distress.      Resistance Training   Training Prescription Yes    Weight 5 lb    Reps 10-15             Exercise Goals: Frequency: Be able to perform aerobic exercise two to three times per week in program working toward 2-5 days per week of home exercise.  Intensity: Work with a perceived exertion of 11 (fairly light) - 15 (hard) while following your exercise prescription.  We will make changes to your prescription with you as you progress through the program.   Duration: Be able to do 30 to 45 minutes of continuous  aerobic exercise in addition to a 5 minute warm-up and a 5 minute cool-down routine.   Nutrition Goals: Your personal nutrition goals will be established when you do your nutrition analysis with the dietician.  The following are general nutrition guidelines to follow: Cholesterol < 200mg /day Sodium < 1500mg /day Fiber: Men over 50 yrs - 30 grams per day  Personal Goals:  Personal Goals and Risk Factors at Admission - 10/18/23 1533       Core Components/Risk Factors/Patient Goals on Admission    Weight Management Yes;Weight Loss    Intervention Weight Management: Develop a combined nutrition and exercise program designed to reach desired caloric intake, while maintaining appropriate intake of nutrient and fiber, sodium and fats, and appropriate energy expenditure required for the weight goal.;Weight Management: Provide education and appropriate resources to help participant work on and attain dietary goals.;Weight Management/Obesity: Establish reasonable short term and long term weight goals.    Admit Weight 170 lb (77.1 kg)  Goal Weight: Short Term 160 lb (72.6 kg)    Goal Weight: Long Term 150 lb (68 kg)    Expected Outcomes Short Term: Continue to assess and modify interventions until short term weight is achieved;Long Term: Adherence to nutrition and physical activity/exercise program aimed toward attainment of established weight goal;Weight Loss: Understanding of general recommendations for a balanced deficit meal plan, which promotes 1-2 lb weight loss per week and includes a negative energy balance of [phone] kcal/d;Understanding recommendations for meals to include 15-35% energy as protein, 25-35% energy from fat, 35-60% energy from carbohydrates, less than 200mg  of dietary cholesterol, 20-35 gm of total fiber daily;Understanding of distribution of calorie intake throughout the day with the consumption of 4-5 meals/snacks    Diabetes Yes    Intervention Provide education about  signs/symptoms and action to take for hypo/hyperglycemia.;Provide education about proper nutrition, including hydration, and aerobic/resistive exercise prescription along with prescribed medications to achieve blood glucose in normal ranges: Fasting glucose 65-99 mg/dL    Expected Outcomes Short Term: Participant verbalizes understanding of the signs/symptoms and immediate care of hyper/hypoglycemia, proper foot care and importance of medication, aerobic/resistive exercise and nutrition plan for blood glucose control.;Long Term: Attainment of HbA1C < 7%.    Hypertension Yes    Intervention Provide education on lifestyle modifcations including regular physical activity/exercise, weight management, moderate sodium restriction and increased consumption of fresh fruit, vegetables, and low fat dairy, alcohol moderation, and smoking cessation.;Monitor prescription use compliance.    Expected Outcomes Short Term: Continued assessment and intervention until BP is < 140/1mm HG in hypertensive participants. < 130/44mm HG in hypertensive participants with diabetes, heart failure or chronic kidney disease.;Long Term: Maintenance of blood pressure at goal levels.    Lipids Yes    Intervention Provide education and support for participant on nutrition & aerobic/resistive exercise along with prescribed medications to achieve LDL 70mg , HDL >40mg .    Expected Outcomes Short Term: Participant states understanding of desired cholesterol values and is compliant with medications prescribed. Participant is following exercise prescription and nutrition guidelines.;Long Term: Cholesterol controlled with medications as prescribed, with individualized exercise RX and with personalized nutrition plan. Value goals: LDL < 70mg , HDL > 40 mg.             Tobacco Use Initial Evaluation: Social History   Tobacco Use  Smoking Status Never  Smokeless Tobacco Never    Exercise Goals and Review:  Exercise Goals     Row Name  10/18/23 1525             Exercise Goals   Increase Physical Activity Yes       Intervention Provide advice, education, support and counseling about physical activity/exercise needs.;Develop an individualized exercise prescription for aerobic and resistive training based on initial evaluation findings, risk stratification, comorbidities and participant's personal goals.       Expected Outcomes Short Term: Attend rehab on a regular basis to increase amount of physical activity.;Long Term: Add in home exercise to make exercise part of routine and to increase amount of physical activity.;Long Term: Exercising regularly at least 3-5 days a week.       Increase Strength and Stamina Yes       Intervention Provide advice, education, support and counseling about physical activity/exercise needs.;Develop an individualized exercise prescription for aerobic and resistive training based on initial evaluation findings, risk stratification, comorbidities and participant's personal goals.       Expected Outcomes Short Term: Increase workloads from initial exercise prescription for resistance, speed,  and METs.;Short Term: Perform resistance training exercises routinely during rehab and add in resistance training at home;Long Term: Improve cardiorespiratory fitness, muscular endurance and strength as measured by increased METs and functional capacity ( )       Able to understand and use rate of perceived exertion (RPE) scale Yes       Intervention Provide education and explanation on how to use RPE scale       Expected Outcomes Short Term: Able to use RPE daily in rehab to express subjective intensity level;Long Term:  Able to use RPE to guide intensity level when exercising independently       Able to understand and use Dyspnea scale Yes       Intervention Provide education and explanation on how to use Dyspnea scale       Expected Outcomes Short Term: Able to use Dyspnea scale daily in rehab to express  subjective sense of shortness of breath during exertion;Long Term: Able to use Dyspnea scale to guide intensity level when exercising independently       Knowledge and understanding of Target Heart Rate Range (THRR) Yes       Intervention Provide education and explanation of THRR including how the numbers were predicted and where they are located for reference       Expected Outcomes Short Term: Able to state/look up THRR;Short Term: Able to use daily as guideline for intensity in rehab;Long Term: Able to use THRR to govern intensity when exercising independently       Able to check pulse independently Yes       Intervention Provide education and demonstration on how to check pulse in carotid and radial arteries.;Review the importance of being able to check your own pulse for safety during independent exercise       Expected Outcomes Short Term: Able to explain why pulse checking is important during independent exercise;Long Term: Able to check pulse independently and accurately       Understanding of Exercise Prescription Yes       Intervention Provide education, explanation, and written materials on patient's individual exercise prescription       Expected Outcomes Short Term: Able to explain program exercise prescription;Long Term: Able to explain home exercise prescription to exercise independently

## 2023-10-23 ENCOUNTER — Encounter: Payer: 59 | Admitting: *Deleted

## 2023-10-23 DIAGNOSIS — I213 ST elevation (STEMI) myocardial infarction of unspecified site: Secondary | ICD-10-CM

## 2023-10-23 DIAGNOSIS — Z48812 Encounter for surgical aftercare following surgery on the circulatory system: Secondary | ICD-10-CM | POA: Diagnosis not present

## 2023-10-23 NOTE — Progress Notes (Signed)
Daily Session Note  Patient Details  Name: Brent Oneill MRN: [id number] Date of Birth: 1971/07/28 Referring Provider:   Flowsheet Row Cardiac Rehab from 10/18/2023 in [facility] Cardiac and Pulmonary Rehab  Referring Provider Dr. Windell Norfolk       Encounter Date: 10/23/2023  Check In:  Session Check In - 10/23/23 1550       Check-In   Supervising physician immediately available to respond to emergencies See telemetry face sheet for immediately available ER MD    Location [facility]-Cardiac & Pulmonary Rehab    Staff Present Maxon Conetta BS, Exercise Physiologist;Noah Tickle, BS, Exercise Physiologist;Marlisha Vanwyk Jewel Baize RN,BSN;Joseph Hood RCP,RRT,BSRT    Virtual Visit No    Medication changes reported     No    Fall or balance concerns reported    No    Warm-up and Cool-down Performed on first and last piece of equipment    Resistance Training Performed Yes    VAD Patient? No    PAD/SET Patient? No      Pain Assessment   Currently in Pain? No/denies                Social History   Tobacco Use  Smoking Status Never  Smokeless Tobacco Never    Goals Met:  Independence with exercise equipment Exercise tolerated well No report of concerns or symptoms today Strength training completed today  Goals Unmet:  Not Applicable  Comments: First full day of exercise!  Patient was oriented to gym and equipment including functions, settings, policies, and procedures.  Patient's individual exercise prescription and treatment plan were reviewed.  All starting workloads were established based on the results of the 6 minute walk test done at initial orientation visit.  The plan for exercise progression was also introduced and progression will be customized based on patient's performance and goals.    Dr. Bethann Punches is Medical Director for [facility] Cardiac Rehabilitation.  Dr. Vida Rigger is Medical Director for [facility] Pulmonary Rehabilitation.

## 2023-10-23 NOTE — Progress Notes (Signed)
Assessment start time: 2:37 PM  Digestive issues/concerns: no known food allergies   24-hours Recall: B: omelet OR egg sandwich, coffee L: rice, chicken,  D: salmon OR stew  Beverages water, green tea(with sugar)    Education r/t nutrition plan Patient drinking mostly water, but some green tea with sugar in it. Was drinking coffee with sugar and creamer before heart attack. Had his A1C checked and was 6.8% on 09/09/23. He wanted some education around bringing his A1C down. Educated on quality of carbohydrates, how complex carbs stabilize blood sugars better than refined or concentrated sugars like white bread, soda, or white rice. He understands, and says he eats white rice often but has been working on switching to brown rice. Encouraged him to also eat more whole grain bread, sweet or baked potatoes, fruit, beans, and legumes. Reviewed the importance of not eating too many carbs at meals, even if they are good carb choices. Provide target goal of ~30-60g each meal. Reviewed a few labels and provided some reference portions of various foods to help him get a idea of how much of these carbs is appropriate. Reviewed mediterranean diet handout, educated on types of fats, sources, and how to read labels to limit sodium and saturated fat. Build out a few meals with foods he likes focusing on carb controlled portions colorful produce and appropriate portions of lean protein.      Goal 1: Include more colorful produce, aim for 5-8 servings of fruits and veggies per day Goal 2: Eat 15-30gProtein and 30-60gCarbs at each meal. Goal 3: Read labels and reduce sodium intake to below 2300mg . Ideally 1500mg  per day.   End time 3:29 PM

## 2023-10-25 DIAGNOSIS — Z48812 Encounter for surgical aftercare following surgery on the circulatory system: Secondary | ICD-10-CM | POA: Diagnosis not present

## 2023-10-25 DIAGNOSIS — I213 ST elevation (STEMI) myocardial infarction of unspecified site: Secondary | ICD-10-CM

## 2023-10-25 NOTE — Progress Notes (Signed)
Daily Session Note  Patient Details  Name: Brent Oneill MRN: [id number] Date of Birth: 10-11-1970 Referring Provider:   Flowsheet Row Cardiac Rehab from 10/18/2023 in [facility] Cardiac and Pulmonary Rehab  Referring Provider Dr. Windell Norfolk       Encounter Date: 10/25/2023  Check In:  Session Check In - 10/25/23 1601       Check-In   Supervising physician immediately available to respond to emergencies See telemetry face sheet for immediately available ER MD    Location [facility]-Cardiac & Pulmonary Rehab    Staff Present Maxon Conetta BS, Exercise Physiologist;Kelly Cloretta Ned, ACSM CEP, Exercise Physiologist;Esmerelda Finnigan Jewel Baize RN,BSN;Joseph Hood RCP,RRT,BSRT    Virtual Visit No    Medication changes reported     No    Fall or balance concerns reported    No    Warm-up and Cool-down Performed on first and last piece of equipment    Resistance Training Performed Yes    VAD Patient? No    PAD/SET Patient? No      Pain Assessment   Currently in Pain? No/denies                Social History   Tobacco Use  Smoking Status Never  Smokeless Tobacco Never    Goals Met:  Independence with exercise equipment Exercise tolerated well No report of concerns or symptoms today Strength training completed today  Goals Unmet:  Not Applicable  Comments: Pt able to follow exercise prescription today without complaint.  Will continue to monitor for progression.    Dr. Bethann Punches is Medical Director for [facility] Cardiac Rehabilitation.  Dr. Vida Rigger is Medical Director for [facility] Pulmonary Rehabilitation.

## 2023-10-26 DIAGNOSIS — I213 ST elevation (STEMI) myocardial infarction of unspecified site: Secondary | ICD-10-CM

## 2023-10-26 DIAGNOSIS — Z48812 Encounter for surgical aftercare following surgery on the circulatory system: Secondary | ICD-10-CM | POA: Diagnosis not present

## 2023-10-26 NOTE — Progress Notes (Signed)
Daily Session Note  Patient Details  Name: Brent Oneill MRN: [id number] Date of Birth: 03/16/1971 Referring Provider:   Flowsheet Row Cardiac Rehab from 10/18/2023 in [facility] Cardiac and Pulmonary Rehab  Referring Provider Dr. Windell Norfolk       Encounter Date: 10/26/2023  Check In:  Session Check In - 10/26/23 1541       Check-In   Supervising physician immediately available to respond to emergencies See telemetry face sheet for immediately available ER MD    Location [facility]-Cardiac & Pulmonary Rehab    Staff Present Maxon Conetta BS, Exercise Physiologist;Noah Tickle, BS, Exercise Physiologist;Ercelle Winkles Jewel Baize RN,BSN;Joseph Hood RCP,RRT,BSRT    Virtual Visit No    Medication changes reported     No    Fall or balance concerns reported    No    Warm-up and Cool-down Performed on first and last piece of equipment    Resistance Training Performed Yes    VAD Patient? No    PAD/SET Patient? No      Pain Assessment   Currently in Pain? No/denies                Social History   Tobacco Use  Smoking Status Never  Smokeless Tobacco Never    Goals Met:  Independence with exercise equipment Exercise tolerated well No report of concerns or symptoms today Strength training completed today  Goals Unmet:  Not Applicable  Comments: Pt able to follow exercise prescription today without complaint.  Will continue to monitor for progression.    Dr. Bethann Punches is Medical Director for [facility] Cardiac Rehabilitation.  Dr. Vida Rigger is Medical Director for [facility] Pulmonary Rehabilitation.

## 2023-10-30 ENCOUNTER — Encounter: Payer: 59 | Attending: Internal Medicine | Admitting: *Deleted

## 2023-10-30 DIAGNOSIS — I252 Old myocardial infarction: Secondary | ICD-10-CM | POA: Insufficient documentation

## 2023-10-30 DIAGNOSIS — I213 ST elevation (STEMI) myocardial infarction of unspecified site: Secondary | ICD-10-CM

## 2023-10-30 DIAGNOSIS — Z5189 Encounter for other specified aftercare: Secondary | ICD-10-CM | POA: Insufficient documentation

## 2023-10-30 NOTE — Progress Notes (Signed)
Daily Session Note  Patient Details  Name: Brent Oneill MRN: [id number] Date of Birth: [birth year] Referring Provider:   Flowsheet Row Cardiac Rehab from 10/18/2023 in [facility] Cardiac and Pulmonary Rehab  Referring Provider Dr. Windell Norfolk       Encounter Date: 10/30/2023  Check In:  Session Check In - 10/30/23 1548       Check-In   Supervising physician immediately available to respond to emergencies See telemetry face sheet for immediately available ER MD    Location [facility]-Cardiac & Pulmonary Rehab    Staff Present Maxon Conetta BS, Exercise Physiologist;Abbrielle Batts Jewel Baize RN,BSN;Joseph Hood RCP,RRT,BSRT    Virtual Visit No    Medication changes reported     No    Fall or balance concerns reported    No    Warm-up and Cool-down Performed on first and last piece of equipment    Resistance Training Performed Yes    VAD Patient? No    PAD/SET Patient? No      Pain Assessment   Currently in Pain? No/denies                Social History   Tobacco Use  Smoking Status Never  Smokeless Tobacco Never    Goals Met:  Independence with exercise equipment Exercise tolerated well No report of concerns or symptoms today Strength training completed today  Goals Unmet:  Not Applicable  Comments: Pt able to follow exercise prescription today without complaint.  Will continue to monitor for progression.    Dr. Bethann Punches is Medical Director for [facility] Cardiac Rehabilitation.  Dr. Vida Rigger is Medical Director for [facility] Pulmonary Rehabilitation.

## 2023-11-02 ENCOUNTER — Encounter: Payer: 59 | Admitting: *Deleted

## 2023-11-02 DIAGNOSIS — I213 ST elevation (STEMI) myocardial infarction of unspecified site: Secondary | ICD-10-CM

## 2023-11-02 DIAGNOSIS — Z5189 Encounter for other specified aftercare: Secondary | ICD-10-CM | POA: Diagnosis not present

## 2023-11-02 NOTE — Progress Notes (Signed)
 Daily Session Note  Patient Details  Name: Jahan Friedlander MRN: [id number] Date of Birth: 1971/05/25 Referring Provider:   Flowsheet Row Cardiac Rehab from 10/18/2023 in [facility] Cardiac and Pulmonary Rehab  Referring Provider Dr. Keller Paterson       Encounter Date: 11/02/2023  Check In:  Session Check In - 11/02/23 1554       Check-In   Supervising physician immediately available to respond to emergencies See telemetry face sheet for immediately available ER MD    Location [facility]-Cardiac & Pulmonary Rehab    Staff Present Maxon Conetta BS, Exercise Physiologist;Noah Tickle, BS, Exercise Physiologist;Ksenia Kunz Tressa RN,BSN;Joseph Hood RCP,RRT,BSRT    Virtual Visit No    Medication changes reported     No    Fall or balance concerns reported    No    Warm-up and Cool-down Performed on first and last piece of equipment    Resistance Training Performed Yes    VAD Patient? No    PAD/SET Patient? No      Pain Assessment   Currently in Pain? No/denies                Social History   Tobacco Use  Smoking Status Never  Smokeless Tobacco Never    Goals Met:  Independence with exercise equipment Exercise tolerated well No report of concerns or symptoms today Strength training completed today  Goals Unmet:  Not Applicable  Comments: Pt able to follow exercise prescription today without complaint.  Will continue to monitor for progression.    Dr. Oneil Pinal is Medical Director for [facility] Cardiac Rehabilitation.  Dr. Fuad Aleskerov is Medical Director for [facility] Pulmonary Rehabilitation.

## 2023-11-06 ENCOUNTER — Encounter: Payer: 59 | Admitting: *Deleted

## 2023-11-06 DIAGNOSIS — I213 ST elevation (STEMI) myocardial infarction of unspecified site: Secondary | ICD-10-CM

## 2023-11-06 DIAGNOSIS — Z5189 Encounter for other specified aftercare: Secondary | ICD-10-CM | POA: Diagnosis not present

## 2023-11-06 NOTE — Progress Notes (Signed)
 Daily Session Note  Patient Details  Name: Brent Oneill MRN: [id number] Date of Birth: [birth year] Referring Provider:   Flowsheet Row Cardiac Rehab from 10/18/2023 in [facility] Cardiac and Pulmonary Rehab  Referring Provider Dr. Joetta Mustache       Encounter Date: 11/06/2023  Check In:  Session Check In - 11/06/23 1542       Check-In   Supervising physician immediately available to respond to emergencies See telemetry face sheet for immediately available ER MD    Location [facility]-Cardiac & Pulmonary Rehab    Staff Present Maxon Beauford Bounds, Exercise Physiologist;Alexes Lamarque Manson Seitz RN,BSN;Susanne Bice, RN, BSN, CCRP;Joseph Hood RCP,RRT,BSRT    Virtual Visit No    Medication changes reported     No    Fall or balance concerns reported    No    Warm-up and Cool-down Performed on first and last piece of equipment    Resistance Training Performed Yes    VAD Patient? No    PAD/SET Patient? No      Pain Assessment   Currently in Pain? No/denies                Social History   Tobacco Use  Smoking Status Never  Smokeless Tobacco Never    Goals Met:  Independence with exercise equipment Exercise tolerated well No report of concerns or symptoms today Strength training completed today  Goals Unmet:  Not Applicable  Comments: Pt able to follow exercise prescription today without complaint.  Will continue to monitor for progression.    Dr. Firman Hughes is Medical Director for [facility] Cardiac Rehabilitation.  Dr. Fuad Aleskerov is Medical Director for [facility] Pulmonary Rehabilitation.

## 2023-11-08 ENCOUNTER — Encounter: Payer: 59 | Admitting: *Deleted

## 2023-11-08 DIAGNOSIS — I213 ST elevation (STEMI) myocardial infarction of unspecified site: Secondary | ICD-10-CM

## 2023-11-08 DIAGNOSIS — Z5189 Encounter for other specified aftercare: Secondary | ICD-10-CM | POA: Diagnosis not present

## 2023-11-08 NOTE — Progress Notes (Signed)
Cardiac Individual Treatment Plan  Patient Details  Name: Brent Oneill MRN: [id number] Date of Birth: 07-18-1971 Referring Provider:   Flowsheet Row Cardiac Rehab from 10/18/2023 in [facility] Cardiac and Pulmonary Rehab  Referring Provider Dr. Windell Norfolk       Initial Encounter Date:  Flowsheet Row Cardiac Rehab from 10/18/2023 in [facility] Cardiac and Pulmonary Rehab  Date 10/18/23       Visit Diagnosis: ST elevation myocardial infarction (STEMI), unspecified artery (HCC)  Patient's Home Medications on Admission:  Current Outpatient Medications:    acetaminophen (TYLENOL) 325 MG tablet, Take 2 tablets (650 mg total) by mouth every 6 (six) hours as needed for mild pain (pain score 1-3) (or Fever >/= 101)., Disp: 30 tablet, Rfl: 0   albuterol (VENTOLIN HFA) 108 (90 Base) MCG/ACT inhaler, Inhale 2 puffs into the lungs every 6 (six) hours as needed for wheezing or shortness of breath., Disp: , Rfl:    aspirin 81 MG chewable tablet, Chew 1 tablet (81 mg total) by mouth daily., Disp: 30 tablet, Rfl: 11   BREO ELLIPTA 200-25 MCG/ACT AEPB, INHALE 1 PUFF BY MOUTH ONCE DAILY. *NEEDS APPT AND LABS*, Disp: 60 each, Rfl: 1   clopidogrel (PLAVIX) 75 MG tablet, Take 1 tablet (75 mg total) by mouth daily with breakfast., Disp: 90 tablet, Rfl: 11   cyclobenzaprine (FLEXERIL) 10 MG tablet, Take 1 tablet (10 mg total) by mouth 3 (three) times daily as needed for muscle spasms., Disp: 30 tablet, Rfl: 0   fluticasone (FLONASE) 50 MCG/ACT nasal spray, SPRAY 2 SPRAYS INTO EACH NOSTRIL EVERY DAY, Disp: 48 mL, Rfl: 3   ibuprofen (ADVIL) 800 MG tablet, Take 1 tablet (800 mg total) by mouth every 8 (eight) hours as needed. (Patient not taking: Reported on 10/11/2023), Disp: 30 tablet, Rfl: 0   metFORMIN (GLUCOPHAGE) 1000 MG tablet, Take 1 tablet (1,000 mg total) by mouth 2 (two) times daily with a meal., Disp: 60 tablet, Rfl: 10   metoprolol tartrate (LOPRESSOR) 50 MG tablet, Take 1 tablet (50 mg total) by mouth 2 (two)  times daily., Disp: 60 tablet, Rfl: 12   rosuvastatin (CRESTOR) 40 MG tablet, Take 1 tablet (40 mg total) by mouth daily., Disp: 30 tablet, Rfl: 11   tamsulosin (FLOMAX) 0.4 MG CAPS capsule, Take 1 capsule (0.4 mg total) by mouth daily., Disp: 90 capsule, Rfl: 3   Vitamin D, Ergocalciferol, (DRISDOL) 1.25 MG (50000 UNIT) CAPS capsule, Take 50,000 Units by mouth every 7 (seven) days. (Patient not taking: Reported on 10/11/2023), Disp: , Rfl:   Past Medical History: Past Medical History:  Diagnosis Date   Allergic rhinitis 03/14/2016   Asthma, persistent 03/14/2016    Tobacco Use: Social History   Tobacco Use  Smoking Status Never  Smokeless Tobacco Never    Labs: Review Flowsheet       Latest Ref Rng & Units 04/21/2023 08/11/2023 09/09/2023  Labs for ITP Cardiac and Pulmonary Rehab  Cholestrol 0 - 200 mg/dL 409  811  914   LDL (calc) 0 - 99 mg/dL 782  956  213   HDL-C >08 mg/dL 45  55  48   Trlycerides <150 mg/dL 657  846  962   Hemoglobin A1c 4.8 - 5.6 % - 6.4  6.8      Exercise Target Goals: Exercise Program Goal: Individual exercise prescription set using results from initial 6 min walk test and THRR while considering  patient's activity barriers and safety.   Exercise Prescription Goal: Initial exercise prescription builds  to 30-45 minutes a day of aerobic activity, 2-3 days per week.  Home exercise guidelines will be given to patient during program as part of exercise prescription that the participant will acknowledge.   Education: Aerobic Exercise: - Group verbal and visual presentation on the components of exercise prescription. Introduces F.I.T.T principle from ACSM for exercise prescriptions.  Reviews F.I.T.T. principles of aerobic exercise including progression. Written material given at graduation.   Education: Resistance Exercise: - Group verbal and visual presentation on the components of exercise prescription. Introduces F.I.T.T principle from ACSM for exercise  prescriptions  Reviews F.I.T.T. principles of resistance exercise including progression. Written material given at graduation.    Education: Exercise & Equipment Safety: - Individual verbal instruction and demonstration of equipment use and safety with use of the equipment. Flowsheet Row Cardiac Rehab from 10/25/2023 in [facility] Cardiac and Pulmonary Rehab  Date 10/18/23  Educator MB  Instruction Review Code 1- Verbalizes Understanding       Education: Exercise Physiology & General Exercise Guidelines: - Group verbal and written instruction with models to review the exercise physiology of the cardiovascular system and associated critical values. Provides general exercise guidelines with specific guidelines to those with heart or lung disease.    Education: Flexibility, Balance, Mind/Body Relaxation: - Group verbal and visual presentation with interactive activity on the components of exercise prescription. Introduces F.I.T.T principle from ACSM for exercise prescriptions. Reviews F.I.T.T. principles of flexibility and balance exercise training including progression. Also discusses the mind body connection.  Reviews various relaxation techniques to help reduce and manage stress (i.e. Deep breathing, progressive muscle relaxation, and visualization). Balance handout provided to take home. Written material given at graduation.   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 HR 105 bpm     Max Ex. BP 114/70     2 Minute Post BP 108/76              Oxygen Initial Assessment:   Oxygen Re-Evaluation:   Oxygen Discharge (Final Oxygen Re-Evaluation):   Initial Exercise Prescription:  Initial Exercise Prescription - 10/18/23 1500       Date of Initial Exercise RX and Referring Provider   Date 10/18/23    Referring Provider Dr. Windell Norfolk      Oxygen   Maintain Oxygen Saturation 88% or higher      Treadmill   MPH  2.8    Grade 0.5    Minutes 15    METs 3.34      NuStep   Level 4    SPM 80    Minutes 15    METs 4.12      Elliptical   Level 1    Speed 3    Minutes 15    METs 4.12      REL-XR   Level 3    Speed 50    Minutes 15    METs 4.12      T5 Nustep   Level 4   T6   SPM 80    Minutes 15    METs 4.12      Prescription Details   Frequency (times per week) 3    Duration Progress to 30 minutes of continuous aerobic without signs/symptoms of physical distress      Intensity   THRR 40-80% of Max Heartrate 118-151    Ratings of Perceived Exertion 11-13    Perceived Dyspnea 0-4      Progression   Progression Continue to progress workloads to maintain intensity without signs/symptoms of physical distress.      Resistance Training   Training Prescription Yes    Weight 5 lb    Reps 10-15             Perform Capillary Blood Glucose checks as needed.  Exercise Prescription Changes:   Exercise Prescription Changes     Row Name 10/18/23 1500             Response to Exercise   Blood Pressure (Admit) 108/74       Blood Pressure (Exercise) 114/70       Blood Pressure (Exit) 108/76       Heart Rate (Admit) 91 bpm       Heart Rate (Exercise) 105 bpm       Heart Rate (Exit) 85 bpm       Oxygen  Saturation (Admit) 96 %       Oxygen Saturation (Exercise) 95 %       Oxygen Saturation (Exit) 98 %       Rating of Perceived Exertion (Exercise) 11       Perceived Dyspnea (Exercise) 0       Symptoms none       Comments results       Duration Progress to 30 minutes of  aerobic without signs/symptoms of physical distress       Intensity THRR New         Progression   Progression Continue to progress workloads to maintain intensity without signs/symptoms of physical distress.       Average METs 4.12                Exercise Comments:   Exercise Comments     Row Name 10/23/23 1550           Exercise Comments First full day of exercise!  Patient was oriented to gym and equipment including functions, settings, policies, and procedures.  Patient's individual exercise prescription and treatment plan were reviewed.  All starting workloads were established based on the results of the 6 minute walk test done at initial orientation visit.  The plan for exercise progression was also introduced and progression will be customized based on patient's performance and goals.                Exercise Goals and Review:   Exercise Goals  Row Name 10/18/23 1525             Exercise Goals   Increase Physical Activity Yes       Intervention Provide advice, education, support and counseling about physical activity/exercise needs.;Develop an individualized exercise prescription for aerobic and resistive training based on initial evaluation findings, risk stratification, comorbidities and participant's personal goals.       Expected Outcomes Short Term: Attend rehab on a regular basis to increase amount of physical activity.;Long Term: Add in home exercise to make exercise part of routine and to increase amount of physical activity.;Long Term: Exercising regularly at least 3-5 days a week.       Increase Strength and Stamina Yes       Intervention Provide advice, education, support  and counseling about physical activity/exercise needs.;Develop an individualized exercise prescription for aerobic and resistive training based on initial evaluation findings, risk stratification, comorbidities and participant's personal goals.       Expected Outcomes Short Term: Increase workloads from initial exercise prescription for resistance, speed, and METs.;Short Term: Perform resistance training exercises routinely during rehab and add in resistance training at home;Long Term: Improve cardiorespiratory fitness, muscular endurance and strength as measured by increased METs and functional capacity ( )       Able to understand and use rate of perceived exertion (RPE) scale Yes       Intervention Provide education and explanation on how to use RPE scale       Expected Outcomes Short Term: Able to use RPE daily in rehab to express subjective intensity level;Long Term:  Able to use RPE to guide intensity level when exercising independently       Able to understand and use Dyspnea scale Yes       Intervention Provide education and explanation on how to use Dyspnea scale       Expected Outcomes Short Term: Able to use Dyspnea scale daily in rehab to express subjective sense of shortness of breath during exertion;Long Term: Able to use Dyspnea scale to guide intensity level when exercising independently       Knowledge and understanding of Target Heart Rate Range (THRR) Yes       Intervention Provide education and explanation of THRR including how the numbers were predicted and where they are located for reference       Expected Outcomes Short Term: Able to state/look up THRR;Short Term: Able to use daily as guideline for intensity in rehab;Long Term: Able to use THRR to govern intensity when exercising independently       Able to check pulse independently Yes       Intervention Provide education and demonstration on how to check pulse in carotid and radial arteries.;Review the importance of being  able to check your own pulse for safety during independent exercise       Expected Outcomes Short Term: Able to explain why pulse checking is important during independent exercise;Long Term: Able to check pulse independently and accurately       Understanding of Exercise Prescription Yes       Intervention Provide education, explanation, and written materials on patient's individual exercise prescription       Expected Outcomes Short Term: Able to explain program exercise prescription;Long Term: Able to explain home exercise prescription to exercise independently                Exercise Goals Re-Evaluation :  Exercise Goals Re-Evaluation     Row Name 10/23/23 1551  Exercise Goal Re-Evaluation   Exercise Goals Review Increase Physical Activity;Able to understand and use rate of perceived exertion (RPE) scale;Knowledge and understanding of Target Heart Rate Range (THRR);Understanding of Exercise Prescription;Increase Strength and Stamina;Able to check pulse independently       Comments Reviewed RPE and dyspnea scale, THR and program prescription with pt today.  Pt voiced understanding and was given a copy of goals to take home.       Expected Outcomes Short: Use RPE daily to regulate intensity.  Long: Follow program prescription in THR.                Discharge Exercise Prescription (Final Exercise Prescription Changes):  Exercise Prescription Changes - 10/18/23 1500       Response to Exercise   Blood Pressure (Admit) 108/74    Blood Pressure (Exercise) 114/70    Blood Pressure (Exit) 108/76    Heart Rate (Admit) 91 bpm    Heart Rate (Exercise) 105 bpm    Heart Rate (Exit) 85 bpm    Oxygen Saturation (Admit) 96 %    Oxygen Saturation (Exercise) 95 %    Oxygen Saturation (Exit) 98 %    Rating of Perceived Exertion (Exercise) 11    Perceived Dyspnea (Exercise) 0    Symptoms none    Comments results    Duration Progress to 30 minutes of  aerobic without  signs/symptoms of physical distress    Intensity THRR New      Progression   Progression Continue to progress workloads to maintain intensity without signs/symptoms of physical distress.    Average METs 4.12             Nutrition:  Target Goals: Understanding of nutrition guidelines, daily intake of sodium 1500mg , cholesterol 200mg , calories 30% from fat and 7% or less from saturated fats, daily to have 5 or more servings of fruits and vegetables.  Education: All About Nutrition: -Group instruction provided by verbal, written material, interactive activities, discussions, models, and posters to present general guidelines for heart healthy nutrition including fat, fiber, MyPlate, the role of sodium in heart healthy nutrition, utilization of the nutrition label, and utilization of this knowledge for meal planning. Follow up email sent as well. Written material given at graduation.   Biometrics:  Pre Biometrics - 10/18/23 1526       Pre Biometrics   Height 5' 6.9" (1.699 m)    Weight 170 lb (77.1 kg)    Waist Circumference 36 inches    Hip Circumference 35.5 inches    Waist to Hip Ratio 1.01 %    BMI (Calculated) 26.71    Single Leg Stand 30 seconds              Nutrition Therapy Plan and Nutrition Goals:  Nutrition Therapy & Goals - 10/23/23 1614       Nutrition Therapy   Diet Carb controlled, cardiac    Protein (specify units) 90g    Fiber 30 grams    Whole Grain Foods 3 servings    Saturated Fats 15 max. grams    Fruits and Vegetables 5 servings/day    Sodium 2 grams      Personal Nutrition Goals   Nutrition Goal Include more colorful produce, aim for 5-8 servings of fruits and veggies per day    Personal Goal #2 Eat 15-30gProtein and 30-60gCarbs at each meal.    Personal Goal #3 Read labels and reduce sodium intake to below 2300mg . Ideally 1500mg  per day.  Comments Patient drinking mostly water, but some green tea with sugar in it. Was drinking coffee  with sugar and creamer before heart attack. Had his A1C checked and was 6.8% on 09/09/23. He wanted some education around bringing his A1C down. Educated on quality of carbohydrates, how complex carbs stabilize blood sugars better than refined or concentrated sugars like white bread, soda, or white rice. He understands, and says he eats white rice often but has been working on switching to brown rice. Encouraged him to also eat more whole grain bread, sweet or baked potatoes, fruit, beans, and legumes. Reviewed the importance of not eating too many carbs at meals, even if they are good carb choices. Provide target goal of ~30-60g each meal. Reviewed a few labels and provided some reference portions of various foods to help him get a idea of how much of these carbs is appropriate. Reviewed mediterranean diet handout, educated on types of fats, sources, and how to read labels to limit sodium and saturated fat. Build out a few meals with foods he likes focusing on carb controlled portions colorful produce and appropriate portions of lean protein.      Intervention Plan   Intervention Prescribe, educate and counsel regarding individualized specific dietary modifications aiming towards targeted core components such as weight, hypertension, lipid management, diabetes, heart failure and other comorbidities.;Nutrition handout(s) given to patient.    Expected Outcomes Short Term Goal: Understand basic principles of dietary content, such as calories, fat, sodium, cholesterol and nutrients.;Short Term Goal: A plan has been developed with personal nutrition goals set during dietitian appointment.;Long Term Goal: Adherence to prescribed nutrition plan.             Nutrition Assessments:  MEDIFICTS Score Key: >=70 Need to make dietary changes  40-70 Heart Healthy Diet <= 40 Therapeutic Level Cholesterol Diet  Flowsheet Row Cardiac Rehab from 10/18/2023 in [facility] Cardiac and Pulmonary Rehab  Picture Your Plate  Total Score on Admission 67      Picture Your Plate Scores: <16 Unhealthy dietary pattern with much room for improvement. 41-50 Dietary pattern unlikely to meet recommendations for good health and room for improvement. 51-60 More healthful dietary pattern, with some room for improvement.  >60 Healthy dietary pattern, although there may be some specific behaviors that could be improved.    Nutrition Goals Re-Evaluation:   Nutrition Goals Discharge (Final Nutrition Goals Re-Evaluation):   Psychosocial: Target Goals: Acknowledge presence or absence of significant depression and/or stress, maximize coping skills, provide positive support system. Participant is able to verbalize types and ability to use techniques and skills needed for reducing stress and depression.   Education: Stress, Anxiety, and Depression - Group verbal and visual presentation to define topics covered.  Reviews how body is impacted by stress, anxiety, and depression.  Also discusses healthy ways to reduce stress and to treat/manage anxiety and depression.  Written material given at graduation.   Education: Sleep Hygiene -Provides group verbal and written instruction about how sleep can affect your health.  Define sleep hygiene, discuss sleep cycles and impact of sleep habits. Review good sleep hygiene tips.    Initial Review & Psychosocial Screening:  Initial Psych Review & Screening - 10/11/23 1428       Initial Review   Current issues with None Identified      Family Dynamics   Good Support System? Yes   wife daughter and son     Barriers   Psychosocial barriers to participate in program There are no  identifiable barriers or psychosocial needs.      Screening Interventions   Interventions To provide support and resources with identified psychosocial needs;Provide feedback about the scores to participant    Expected Outcomes Short Term goal: Utilizing psychosocial counselor, staff and physician to assist  with identification of specific Stressors or current issues interfering with healing process. Setting desired goal for each stressor or current issue identified.;Long Term Goal: Stressors or current issues are controlled or eliminated.;Short Term goal: Identification and review with participant of any Quality of Life or Depression concerns found by scoring the questionnaire.;Long Term goal: The participant improves quality of Life and PHQ9 Scores as seen by post scores and/or verbalization of changes             Quality of Life Scores:   Quality of Life - 10/18/23 1531       Quality of Life   Select Quality of Life      Quality of Life Scores   Health/Function Pre 26 %    Socioeconomic Pre 24.75 %    Psych/Spiritual Pre 28.29 %    Family Pre 30 %    GLOBAL Pre 26.74 %            Scores of 19 and below usually indicate a poorer quality of life in these areas.  A difference of  2-3 points is a clinically meaningful difference.  A difference of 2-3 points in the total score of the Quality of Life Index has been associated with significant improvement in overall quality of life, self-image, physical symptoms, and general health in studies assessing change in quality of life.  PHQ-9: Review Flowsheet       10/18/2023 08/25/2023  Depression screen PHQ 2/9  Decreased Interest 0 0  Down, Depressed, Hopeless 0 0  PHQ - 2 Score 0 0  Altered sleeping 0 0  Tired, decreased energy 2 1  Change in appetite 0 0  Feeling bad or failure about yourself  0 0  Trouble concentrating 1 0  Moving slowly or fidgety/restless 0 0  Suicidal thoughts 0 0  PHQ-9 Score 3 1  Difficult doing work/chores Somewhat difficult -   Interpretation of Total Score  Total Score Depression Severity:  1-4 = Minimal depression, 5-9 = Mild depression, 10-14 = Moderate depression, 15-19 = Moderately severe depression, 20-27 = Severe depression   Psychosocial Evaluation and Intervention:  Psychosocial Evaluation  - 10/11/23 1441       Psychosocial Evaluation & Interventions   Interventions Encouraged to exercise with the program and follow exercise prescription    Comments ther are no barriers to attending the program.  He lives with his wife,daughter and son.  THey are his support team. He is ready to start the program amd meet with the RD to work on a  heart healthy weight loss plan.    Expected Outcomes STG attends all scheduled sessions, works on exericse progression and nutrition goals LTG able to continue with exercise progression and nutrition goals after discharge    Continue Psychosocial Services  Follow up required by staff             Psychosocial Re-Evaluation:   Psychosocial Discharge (Final Psychosocial Re-Evaluation):   Vocational Rehabilitation: Provide vocational rehab assistance to qualifying candidates.   Vocational Rehab Evaluation & Intervention:  Vocational Rehab - 10/11/23 1431       Initial Vocational Rehab Evaluation & Intervention   Assessment shows need for Vocational Rehabilitation No  Education: Education Goals: Education classes will be provided on a variety of topics geared toward better understanding of heart health and risk factor modification. Participant will state understanding/return demonstration of topics presented as noted by education test scores.  Learning Barriers/Preferences:  Learning Barriers/Preferences - 10/11/23 1429       Learning Barriers/Preferences   Learning Barriers None    Learning Preferences None             General Cardiac Education Topics:  AED/CPR: - Group verbal and written instruction with the use of models to demonstrate the basic use of the AED with the basic ABC's of resuscitation.   Anatomy and Cardiac Procedures: - Group verbal and visual presentation and models provide information about basic cardiac anatomy and function. Reviews the testing methods done to diagnose heart disease and  the outcomes of the test results. Describes the treatment choices: Medical Management, Angioplasty, or Coronary Bypass Surgery for treating various heart conditions including Myocardial Infarction, Angina, Valve Disease, and Cardiac Arrhythmias.  Written material given at graduation. Flowsheet Row Cardiac Rehab from 10/25/2023 in [facility] Cardiac and Pulmonary Rehab  Date 10/25/23  Educator SB  Instruction Review Code 1- Verbalizes Understanding       Medication Safety: - Group verbal and visual instruction to review commonly prescribed medications for heart and lung disease. Reviews the medication, class of the drug, and side effects. Includes the steps to properly store meds and maintain the prescription regimen.  Written material given at graduation.   Intimacy: - Group verbal instruction through game format to discuss how heart and lung disease can affect sexual intimacy. Written material given at graduation..   Know Your Numbers and Heart Failure: - Group verbal and visual instruction to discuss disease risk factors for cardiac and pulmonary disease and treatment options.  Reviews associated critical values for Overweight/Obesity, Hypertension, Cholesterol, and Diabetes.  Discusses basics of heart failure: signs/symptoms and treatments.  Introduces Heart Failure Zone chart for action plan for heart failure.  Written material given at graduation.   Infection Prevention: - Provides verbal and written material to individual with discussion of infection control including proper hand washing and proper equipment cleaning during exercise session. Flowsheet Row Cardiac Rehab from 10/25/2023 in [facility] Cardiac and Pulmonary Rehab  Date 10/18/23  Educator MB  Instruction Review Code 1- Verbalizes Understanding       Falls Prevention: - Provides verbal and written material to individual with discussion of falls prevention and safety. Flowsheet Row Cardiac Rehab from 10/25/2023 in [facility] Cardiac and  Pulmonary Rehab  Date 10/18/23  Educator MB  Instruction Review Code 1- Verbalizes Understanding       Other: -Provides group and verbal instruction on various topics (see comments)   Knowledge Questionnaire Score:   Core Components/Risk Factors/Patient Goals at Admission:  Personal Goals and Risk Factors at Admission - 10/18/23 1533       Core Components/Risk Factors/Patient Goals on Admission    Weight Management Yes;Weight Loss    Intervention Weight Management: Develop a combined nutrition and exercise program designed to reach desired caloric intake, while maintaining appropriate intake of nutrient and fiber, sodium and fats, and appropriate energy expenditure required for the weight goal.;Weight Management: Provide education and appropriate resources to help participant work on and attain dietary goals.;Weight Management/Obesity: Establish reasonable short term and long term weight goals.    Admit Weight 170 lb (77.1 kg)    Goal Weight: Short Term 160 lb (72.6 kg)    Goal Weight: Long Term  150 lb (68 kg)    Expected Outcomes Short Term: Continue to assess and modify interventions until short term weight is achieved;Long Term: Adherence to nutrition and physical activity/exercise program aimed toward attainment of established weight goal;Weight Loss: Understanding of general recommendations for a balanced deficit meal plan, which promotes 1-2 lb weight loss per week and includes a negative energy balance of [phone] kcal/d;Understanding recommendations for meals to include 15-35% energy as protein, 25-35% energy from fat, 35-60% energy from carbohydrates, less than 200mg  of dietary cholesterol, 20-35 gm of total fiber daily;Understanding of distribution of calorie intake throughout the day with the consumption of 4-5 meals/snacks    Diabetes Yes    Intervention Provide education about signs/symptoms and action to take for hypo/hyperglycemia.;Provide education about proper nutrition,  including hydration, and aerobic/resistive exercise prescription along with prescribed medications to achieve blood glucose in normal ranges: Fasting glucose 65-99 mg/dL    Expected Outcomes Short Term: Participant verbalizes understanding of the signs/symptoms and immediate care of hyper/hypoglycemia, proper foot care and importance of medication, aerobic/resistive exercise and nutrition plan for blood glucose control.;Long Term: Attainment of HbA1C < 7%.    Hypertension Yes    Intervention Provide education on lifestyle modifcations including regular physical activity/exercise, weight management, moderate sodium restriction and increased consumption of fresh fruit, vegetables, and low fat dairy, alcohol moderation, and smoking cessation.;Monitor prescription use compliance.    Expected Outcomes Short Term: Continued assessment and intervention until BP is < 140/65mm HG in hypertensive participants. < 130/36mm HG in hypertensive participants with diabetes, heart failure or chronic kidney disease.;Long Term: Maintenance of blood pressure at goal levels.    Lipids Yes    Intervention Provide education and support for participant on nutrition & aerobic/resistive exercise along with prescribed medications to achieve LDL 70mg , HDL >40mg .    Expected Outcomes Short Term: Participant states understanding of desired cholesterol values and is compliant with medications prescribed. Participant is following exercise prescription and nutrition guidelines.;Long Term: Cholesterol controlled with medications as prescribed, with individualized exercise RX and with personalized nutrition plan. Value goals: LDL < 70mg , HDL > 40 mg.             Education:Diabetes - Individual verbal and written instruction to review signs/symptoms of diabetes, desired ranges of glucose level fasting, after meals and with exercise. Acknowledge that pre and post exercise glucose checks will be done for 3 sessions at entry of  program.   Core Components/Risk Factors/Patient Goals Review:    Core Components/Risk Factors/Patient Goals at Discharge (Final Review):    ITP Comments:  ITP Comments     Row Name 10/11/23 1450 10/18/23 1517 10/23/23 1550 11/08/23 0847     ITP Comments Virtual orientation call completed today. he has an appointment on Date: 10/18/2023  for EP eval and gym Orientation.  Documentation of diagnosis [facility] be found in [facility] Date: 09/09/2023 . Completed and gym orientation. Initial ITP created and sent for review to Dr. Bethann Punches, Medical Director. First full day of exercise!  Patient was oriented to gym and equipment including functions, settings, policies, and procedures.  Patient's individual exercise prescription and treatment plan were reviewed.  All starting workloads were established based on the results of the 6 minute walk test done at initial orientation visit.  The plan for exercise progression was also introduced and progression will be customized based on patient's performance and goals. 30 Day review completed. Medical Director ITP review done, changes made as directed, and signed approval by Medical Director. New  Patient             Comments: 30 day review

## 2023-11-08 NOTE — Progress Notes (Signed)
Daily Session Note  Patient Details  Name: Brent Oneill MRN: [id number] Date of Birth: 07/15/1971 Referring Provider:   Flowsheet Row Cardiac Rehab from 10/18/2023 in [facility] Cardiac and Pulmonary Rehab  Referring Provider Dr. Windell Norfolk       Encounter Date: 11/08/2023  Check In:  Session Check In - 11/08/23 1708       Check-In   Supervising physician immediately available to respond to emergencies See telemetry face sheet for immediately available ER MD    Location [facility]-Cardiac & Pulmonary Rehab    Staff Present Susann Givens RN,BSN;Joseph [facility] Madilyn Fireman BS, ACSM CEP, Exercise Physiologist    Virtual Visit No    Medication changes reported     No    Fall or balance concerns reported    No    Warm-up and Cool-down Performed on first and last piece of equipment    Resistance Training Performed Yes    VAD Patient? No    PAD/SET Patient? No      Pain Assessment   Currently in Pain? No/denies                Social History   Tobacco Use  Smoking Status Never  Smokeless Tobacco Never    Goals Met:  Independence with exercise equipment Exercise tolerated well No report of concerns or symptoms today Strength training completed today  Goals Unmet:  Not Applicable  Comments: Pt able to follow exercise prescription today without complaint.  Will continue to monitor for progression.    Dr. Bethann Punches is Medical Director for [facility] Cardiac Rehabilitation.  Dr. Vida Rigger is Medical Director for [facility] Pulmonary Rehabilitation.

## 2023-11-09 ENCOUNTER — Encounter: Payer: 59 | Admitting: *Deleted

## 2023-11-09 DIAGNOSIS — Z5189 Encounter for other specified aftercare: Secondary | ICD-10-CM | POA: Diagnosis not present

## 2023-11-09 DIAGNOSIS — I213 ST elevation (STEMI) myocardial infarction of unspecified site: Secondary | ICD-10-CM

## 2023-11-09 NOTE — Progress Notes (Signed)
Daily Session Note  Patient Details  Name: Brent Oneill MRN: [id number] Date of Birth: 02-06-1971 Referring Provider:   Flowsheet Row Cardiac Rehab from 10/18/2023 in [facility] Cardiac and Pulmonary Rehab  Referring Provider Dr. Windell Norfolk       Encounter Date: 11/09/2023  Check In:  Session Check In - 11/09/23 1543       Check-In   Supervising physician immediately available to respond to emergencies See telemetry face sheet for immediately available ER MD    Location [facility]-Cardiac & Pulmonary Rehab    Staff Present Susann Givens RN,BSN;Joseph [facility] BS, Exercise Physiologist;Noah Tickle, BS, Exercise Physiologist    Virtual Visit No    Medication changes reported     No    Fall or balance concerns reported    No    Warm-up and Cool-down Performed on first and last piece of equipment    Resistance Training Performed Yes    VAD Patient? No    PAD/SET Patient? No      Pain Assessment   Currently in Pain? No/denies                Social History   Tobacco Use  Smoking Status Never  Smokeless Tobacco Never    Goals Met:  Independence with exercise equipment Exercise tolerated well No report of concerns or symptoms today Strength training completed today  Goals Unmet:  Not Applicable  Comments: Pt able to follow exercise prescription today without complaint.  Will continue to monitor for progression.    Dr. Bethann Punches is Medical Director for [facility] Cardiac Rehabilitation.  Dr. Vida Rigger is Medical Director for [facility] Pulmonary Rehabilitation.

## 2023-11-13 ENCOUNTER — Encounter: Payer: 59 | Admitting: *Deleted

## 2023-11-13 DIAGNOSIS — Z5189 Encounter for other specified aftercare: Secondary | ICD-10-CM | POA: Diagnosis not present

## 2023-11-13 DIAGNOSIS — I213 ST elevation (STEMI) myocardial infarction of unspecified site: Secondary | ICD-10-CM

## 2023-11-13 NOTE — Progress Notes (Signed)
Daily Session Note  Patient Details  Name: Brent Oneill MRN: [id number] Date of Birth: October 07, 1970 Referring Provider:   Flowsheet Row Cardiac Rehab from 10/18/2023 in [facility] Cardiac and Pulmonary Rehab  Referring Provider Dr. Windell Norfolk       Encounter Date: 11/13/2023  Check In:  Session Check In - 11/13/23 1559       Check-In   Supervising physician immediately available to respond to emergencies See telemetry face sheet for immediately available ER MD    Location [facility]-Cardiac & Pulmonary Rehab    Staff Present Susann Givens RN,BSN;[facility] Manya Silvas BS, Exercise Physiologist;Kelly Cloretta Ned, ACSM CEP, Exercise Physiologist    Virtual Visit No    Medication changes reported     No    Fall or balance concerns reported    No    Warm-up and Cool-down Performed on first and last piece of equipment    Resistance Training Performed Yes    VAD Patient? No    PAD/SET Patient? No      Pain Assessment   Currently in Pain? No/denies                Social History   Tobacco Use  Smoking Status Never  Smokeless Tobacco Never    Goals Met:  Independence with exercise equipment Exercise tolerated well No report of concerns or symptoms today Strength training completed today  Goals Unmet:  Not Applicable  Comments: Pt able to follow exercise prescription today without complaint.  Will continue to monitor for progression.    Dr. Bethann Punches is Medical Director for [facility] Cardiac Rehabilitation.  Dr. Vida Rigger is Medical Director for [facility] Pulmonary Rehabilitation.

## 2023-11-16 ENCOUNTER — Encounter: Payer: 59 | Admitting: *Deleted

## 2023-11-16 DIAGNOSIS — Z5189 Encounter for other specified aftercare: Secondary | ICD-10-CM | POA: Diagnosis not present

## 2023-11-16 DIAGNOSIS — I213 ST elevation (STEMI) myocardial infarction of unspecified site: Secondary | ICD-10-CM

## 2023-11-16 NOTE — Progress Notes (Signed)
Daily Session Note  Patient Details  Name: Brent Oneill MRN: [id number] Date of Birth: 08/08/1971 Referring Provider:   Flowsheet Row Cardiac Rehab from 10/18/2023 in [facility] Cardiac and Pulmonary Rehab  Referring Provider Dr. Windell Norfolk       Encounter Date: 11/16/2023  Check In:  Session Check In - 11/16/23 1541       Check-In   Supervising physician immediately available to respond to emergencies See telemetry face sheet for immediately available ER MD    Location [facility]-Cardiac & Pulmonary Rehab    Staff Present Rory Percy, MS, Exercise Physiologist;Maxon Conetta BS, Exercise Physiologist;Morenike Cuff, RN, BSN, CCRP    Virtual Visit No    Medication changes reported     No    Fall or balance concerns reported    No    Warm-up and Cool-down Performed on first and last piece of equipment    Resistance Training Performed Yes    VAD Patient? No    PAD/SET Patient? No      Pain Assessment   Currently in Pain? No/denies                Social History   Tobacco Use  Smoking Status Never  Smokeless Tobacco Never    Goals Met:  Independence with exercise equipment Exercise tolerated well No report of concerns or symptoms today  Goals Unmet:  Not Applicable  Comments: Pt able to follow exercise prescription today without complaint.  Will continue to monitor for progression.    Dr. Bethann Punches is Medical Director for [facility] Cardiac Rehabilitation.  Dr. Vida Rigger is Medical Director for [facility] Pulmonary Rehabilitation.

## 2023-11-17 ENCOUNTER — Ambulatory Visit (INDEPENDENT_AMBULATORY_CARE_PROVIDER_SITE_OTHER): Payer: 59 | Admitting: Internal Medicine

## 2023-11-17 ENCOUNTER — Encounter: Payer: Self-pay | Admitting: Internal Medicine

## 2023-11-17 VITALS — BP 130/80 | HR 93 | Ht 66.0 in | Wt 170.4 lb

## 2023-11-17 DIAGNOSIS — R7303 Prediabetes: Secondary | ICD-10-CM

## 2023-11-17 DIAGNOSIS — M76892 Other specified enthesopathies of left lower limb, excluding foot: Secondary | ICD-10-CM

## 2023-11-17 DIAGNOSIS — I10 Essential (primary) hypertension: Secondary | ICD-10-CM | POA: Diagnosis not present

## 2023-11-17 DIAGNOSIS — E7800 Pure hypercholesterolemia, unspecified: Secondary | ICD-10-CM

## 2023-11-17 DIAGNOSIS — J45998 Other asthma: Secondary | ICD-10-CM

## 2023-11-17 DIAGNOSIS — M545 Low back pain, unspecified: Secondary | ICD-10-CM

## 2023-11-17 DIAGNOSIS — I2510 Atherosclerotic heart disease of native coronary artery without angina pectoris: Secondary | ICD-10-CM | POA: Diagnosis not present

## 2023-11-17 DIAGNOSIS — I2583 Coronary atherosclerosis due to lipid rich plaque: Secondary | ICD-10-CM

## 2023-11-17 MED ORDER — FLUTICASONE FUROATE-VILANTEROL 200-25 MCG/ACT IN AEPB: 1.0000 | INHALATION_SPRAY | Freq: Every day | RESPIRATORY_TRACT | 2 refills | Status: AC

## 2023-11-17 NOTE — Progress Notes (Signed)
Established Patient Office Visit  Subjective:  Patient ID: Brent Oneill, male    DOB: 1971-09-18  Age: 53 y.o. MRN: [id number]  Chief Complaint  Patient presents with   Follow-up    3 month follow up    Hospital f/u for STEMI, had 2 pcis done with RCA stent. Since then has lost weight.    No other concerns at this time.   Past Medical History:  Diagnosis Date   Allergic rhinitis 03/14/2016   Asthma, persistent 03/14/2016    Past Surgical History:  Procedure Laterality Date   CORONARY/GRAFT ACUTE MI REVASCULARIZATION N/A 09/09/2023   Procedure: Coronary/Graft Acute MI Revascularization;  Surgeon: Alwyn Pea, MD;  Location: [facility] INVASIVE CV LAB;  Service: Cardiovascular;  Laterality: N/A;   LEFT HEART CATH AND CORONARY ANGIOGRAPHY N/A 09/09/2023   Procedure: LEFT HEART CATH AND CORONARY ANGIOGRAPHY;  Surgeon: Alwyn Pea, MD;  Location: [facility] INVASIVE CV LAB;  Service: Cardiovascular;  Laterality: N/A;   LEFT HEART CATH AND CORONARY ANGIOGRAPHY N/A 09/11/2023   Procedure: LEFT HEART CATH AND CORONARY ANGIOGRAPHY;  Surgeon: Alwyn Pea, MD;  Location: [facility] INVASIVE CV LAB;  Service: Cardiovascular;  Laterality: N/A;   NO PAST SURGERIES      Social History   Socioeconomic History   Marital status: Married    Spouse name: Not on file   Number of children: Not on file   Years of education: Not on file   Highest education level: Not on file  Occupational History   Not on file  Tobacco Use   Smoking status: Never   Smokeless tobacco: Never  Vaping Use   Vaping status: Never Used  Substance and Sexual Activity   Alcohol use: Yes    Alcohol/week: 0.0 standard drinks of alcohol   Drug use: No   Sexual activity: Not on file  Other Topics Concern   Not on file  Social History Narrative   Not on file   Social Drivers of Health   Financial Resource Strain: Not on file  Food Insecurity: No Food Insecurity (09/09/2023)   Hunger Vital Sign    Worried  About Running Out of Food in the Last Year: Never true    Ran Out of Food in the Last Year: Never true  Transportation Needs: No Transportation Needs (09/09/2023)   PRAPARE - Administrator, Civil Service (Medical): No    Lack of Transportation (Non-Medical): No  Physical Activity: Not on file  Stress: Not on file  Social Connections: Not on file  Intimate Partner Violence: Not At Risk (09/09/2023)   Humiliation, Afraid, Rape, and Kick questionnaire    Fear of Current or Ex-Partner: No    Emotionally Abused: No    Physically Abused: No    Sexually Abused: No    Family History  Problem Relation Age of Onset   Kidney cancer Neg Hx    Prostate cancer Neg Hx     Allergies  Allergen Reactions   Quinine Derivatives Itching    Outpatient Medications Prior to Visit  Medication Sig   acetaminophen (TYLENOL) 325 MG tablet Take 2 tablets (650 mg total) by mouth every 6 (six) hours as needed for mild pain (pain score 1-3) (or Fever >/= 101).   albuterol (VENTOLIN HFA) 108 (90 Base) MCG/ACT inhaler Inhale 2 puffs into the lungs every 6 (six) hours as needed for wheezing or shortness of breath.   aspirin 81 MG chewable tablet Chew 1 tablet (81 mg total) by  mouth daily.   clopidogrel (PLAVIX) 75 MG tablet Take 1 tablet (75 mg total) by mouth daily with breakfast.   fluticasone (FLONASE) 50 MCG/ACT nasal spray SPRAY 2 SPRAYS INTO EACH NOSTRIL EVERY DAY   metFORMIN (GLUCOPHAGE) 1000 MG tablet Take 1 tablet (1,000 mg total) by mouth 2 (two) times daily with a meal.   metoprolol tartrate (LOPRESSOR) 50 MG tablet Take 1 tablet (50 mg total) by mouth 2 (two) times daily.   rosuvastatin (CRESTOR) 40 MG tablet Take 1 tablet (40 mg total) by mouth daily.   tamsulosin (FLOMAX) 0.4 MG CAPS capsule Take 1 capsule (0.4 mg total) by mouth daily.   [DISCONTINUED] BREO ELLIPTA 200-25 MCG/ACT AEPB INHALE 1 PUFF BY MOUTH ONCE DAILY. *NEEDS APPT AND LABS*   [DISCONTINUED] cyclobenzaprine (FLEXERIL)  10 MG tablet Take 1 tablet (10 mg total) by mouth 3 (three) times daily as needed for muscle spasms.   ibuprofen (ADVIL) 800 MG tablet Take 1 tablet (800 mg total) by mouth every 8 (eight) hours as needed. (Patient not taking: Reported on 11/17/2023)   Vitamin D, Ergocalciferol, (DRISDOL) 1.25 MG (50000 UNIT) CAPS capsule Take 50,000 Units by mouth every 7 (seven) days. (Patient not taking: Reported on 10/11/2023)   No facility-administered medications prior to visit.    Review of Systems  Constitutional:  Positive for weight loss (7 lbs).  HENT: Negative.    Eyes: Negative.   Respiratory: Negative.    Cardiovascular: Negative.   Gastrointestinal: Negative.   Genitourinary: Negative.   Musculoskeletal:  Positive for joint pain.  Skin: Negative.   Neurological: Negative.   Endo/Heme/Allergies: Negative.        Objective:   BP 130/80   Pulse 93   Ht 5\' 6"  (1.676 m)   Wt 170 lb 6.4 oz (77.3 kg)   SpO2 97%   BMI 27.50 kg/m   Vitals:   11/17/23 1420  BP: 130/80  Pulse: 93  Height: 5\' 6"  (1.676 m)  Weight: 170 lb 6.4 oz (77.3 kg)  SpO2: 97%  BMI (Calculated): 27.52    Physical Exam Vitals reviewed.  Constitutional:      Appearance: Normal appearance.  HENT:     Head: Normocephalic.     Left Ear: There is no impacted cerumen.     Nose: Nose normal.     Mouth/Throat:     Mouth: Mucous membranes are moist.     Pharynx: No posterior oropharyngeal erythema.  Eyes:     Extraocular Movements: Extraocular movements intact.     Pupils: Pupils are equal, round, and reactive to light.  Cardiovascular:     Rate and Rhythm: Regular rhythm.     Chest Wall: PMI is not displaced.     Pulses: Normal pulses.     Heart sounds: Normal heart sounds. No murmur heard. Pulmonary:     Effort: Pulmonary effort is normal.     Breath sounds: Normal air entry. No rhonchi or rales.  Abdominal:     General: Abdomen is flat. Bowel sounds are normal. There is no distension.     Palpations:  Abdomen is soft. There is no hepatomegaly, splenomegaly or mass.     Tenderness: There is no abdominal tenderness.  Musculoskeletal:        General: Normal range of motion.     Cervical back: Normal range of motion and neck supple.     Right hip: Normal. Normal range of motion.     Right lower leg: No edema.  Left lower leg: No edema.     Comments: Tenderness left groin, pain exacerbated by flexion and internal rotation of thigh  Skin:    General: Skin is warm and dry.  Neurological:     General: No focal deficit present.     Mental Status: He is alert and oriented to person, place, and time.     Cranial Nerves: No cranial nerve deficit.     Motor: No weakness.  Psychiatric:        Mood and Affect: Mood normal.        Behavior: Behavior normal.      No results found for any visits on 11/17/23.      Assessment & Plan:  As per problem list  Problem List Items Addressed This Visit       Cardiovascular and Mediastinum   Essential (primary) hypertension   Coronary artery disease due to lipid rich plaque - Primary   Relevant Orders   CBC With Diff/Platelet     Musculoskeletal and Integument   Hip flexor tendinitis, left     Other   Pure hypercholesterolemia   Relevant Orders   Lipid panel   Hepatic function panel   Prediabetes   Relevant Orders   Hemoglobin A1c   Low back pain   Other Visit Diagnoses       Other asthma       Relevant Medications   fluticasone furoate-vilanterol (BREO ELLIPTA) 200-25 MCG/ACT AEPB       Return in about 2 weeks (around 12/01/2023) for fu with labs prior.   Total time spent: 30 minutes  Luna Fuse, MD  11/17/2023   This document may have been prepared by [facility] Voice Recognition software and as such may include unintentional dictation errors.

## 2023-11-20 ENCOUNTER — Encounter: Payer: 59 | Admitting: *Deleted

## 2023-11-20 DIAGNOSIS — Z5189 Encounter for other specified aftercare: Secondary | ICD-10-CM | POA: Diagnosis not present

## 2023-11-20 DIAGNOSIS — I213 ST elevation (STEMI) myocardial infarction of unspecified site: Secondary | ICD-10-CM

## 2023-11-20 NOTE — Progress Notes (Signed)
 Daily Session Note  Patient Details  Name: Brent Oneill MRN: [id number] Date of Birth: 1971-03-13 Referring Provider:   Flowsheet Row Cardiac Rehab from 10/18/2023 in [facility] Cardiac and Pulmonary Rehab  Referring Provider Dr. Windell Norfolk       Encounter Date: 11/20/2023  Check In:  Session Check In - 11/20/23 1605       Check-In   Supervising physician immediately available to respond to emergencies See telemetry face sheet for immediately available ER MD    Location [facility]-Cardiac & Pulmonary Rehab    Staff Present Susann Givens RN,BSN;Joseph [facility] BS, Exercise Physiologist    Virtual Visit No    Medication changes reported     No    Fall or balance concerns reported    No    Warm-up and Cool-down Performed on first and last piece of equipment    Resistance Training Performed Yes    VAD Patient? No    PAD/SET Patient? No      Pain Assessment   Currently in Pain? No/denies                Social History   Tobacco Use  Smoking Status Never  Smokeless Tobacco Never    Goals Met:  Independence with exercise equipment Exercise tolerated well No report of concerns or symptoms today Strength training completed today  Goals Unmet:  Not Applicable  Comments: Pt able to follow exercise prescription today without complaint.  Will continue to monitor for progression.    Dr. Bethann Punches is Medical Director for [facility] Cardiac Rehabilitation.  Dr. Vida Rigger is Medical Director for [facility] Pulmonary Rehabilitation.

## 2023-11-22 ENCOUNTER — Other Ambulatory Visit: Payer: Self-pay | Admitting: Urology

## 2023-11-22 ENCOUNTER — Encounter: Payer: 59 | Admitting: *Deleted

## 2023-11-22 DIAGNOSIS — Z5189 Encounter for other specified aftercare: Secondary | ICD-10-CM | POA: Diagnosis not present

## 2023-11-22 DIAGNOSIS — I213 ST elevation (STEMI) myocardial infarction of unspecified site: Secondary | ICD-10-CM

## 2023-11-22 NOTE — Progress Notes (Signed)
 Daily Session Note  Patient Details  Name: Ulric Salzman MRN: [id number] Date of Birth: [birth year] Referring Provider:   Flowsheet Row Cardiac Rehab from 10/18/2023 in [facility] Cardiac and Pulmonary Rehab  Referring Provider Dr. Windell Norfolk       Encounter Date: 11/22/2023  Check In:  Session Check In - 11/22/23 1533       Check-In   Supervising physician immediately available to respond to emergencies See telemetry face sheet for immediately available ER MD    Location [facility]-Cardiac & Pulmonary Rehab    Staff Present Susann Givens RN,BSN;Margaret Best, MS, Exercise Physiologist;Maxon Conetta BS, Exercise Physiologist;Joseph Reino Kent RCP,RRT,BSRT    Virtual Visit No    Medication changes reported     No    Fall or balance concerns reported    No    Warm-up and Cool-down Performed on first and last piece of equipment    Resistance Training Performed Yes    VAD Patient? No    PAD/SET Patient? No      Pain Assessment   Currently in Pain? No/denies                Social History   Tobacco Use  Smoking Status Never  Smokeless Tobacco Never    Goals Met:  Independence with exercise equipment Exercise tolerated well No report of concerns or symptoms today Strength training completed today  Goals Unmet:  Not Applicable  Comments: Pt able to follow exercise prescription today without complaint.  Will continue to monitor for progression.    Dr. Bethann Punches is Medical Director for [facility] Cardiac Rehabilitation.  Dr. Vida Rigger is Medical Director for [facility] Pulmonary Rehabilitation.

## 2023-11-24 ENCOUNTER — Telehealth: Payer: Self-pay | Admitting: Urology

## 2023-11-24 DIAGNOSIS — N138 Other obstructive and reflux uropathy: Secondary | ICD-10-CM

## 2023-11-24 MED ORDER — TAMSULOSIN HCL 0.4 MG PO CAPS: 0.4000 mg | ORAL_CAPSULE | Freq: Every day | ORAL | 1 refills | Status: DC

## 2023-11-24 NOTE — Telephone Encounter (Signed)
 PT called to get a refill on medication [facility])

## 2023-11-24 NOTE — Telephone Encounter (Signed)
 Short term supply sent in for patient to last until his upcoming appointment.

## 2023-11-27 ENCOUNTER — Encounter: Payer: 59 | Attending: Internal Medicine | Admitting: *Deleted

## 2023-11-27 DIAGNOSIS — I213 ST elevation (STEMI) myocardial infarction of unspecified site: Secondary | ICD-10-CM | POA: Insufficient documentation

## 2023-11-27 NOTE — Progress Notes (Signed)
 Daily Session Note  Patient Details  Name: Dozier Berkovich MRN: [id number] Date of Birth: 03-16-1971 Referring Provider:   Flowsheet Row Cardiac Rehab from 10/18/2023 in [facility]  Referring Provider Dr. Windell Norfolk       Encounter Date: 11/27/2023  Check In:  Session Check In - 11/27/23 1552       Check-In   Supervising physician immediately available to respond to emergencies See telemetry face sheet for immediately available ER MD    Location [facility]-Cardiac & Pul[facility]    Staff Present Susann Givens RN,BSN;Joseph Hollace Kinnier;Betsy Coder PhD, RN,CNS,CEN    Virtual Visit No    Medication changes reported     No    Fall or balance concerns reported    No    Warm-up and Cool-down Performed on first and last piece of equipment    Resistance Training Performed Yes    VAD Patient? No    PAD/SET Patient? No      Pain Assessment   Currently in Pain? No/denies                Social History   Tobacco Use  Smoking Status Never  Smokeless Tobacco Never    Goals Met:  Independence with exercise equipment Exercise tolerated well No report of concerns or symptoms today Strength training completed today  Goals Unmet:  Not Applicable  Comments: Pt able to follow exercise prescription today without complaint.  Will continue to monitor for progression.    Dr. Bethann Punches is Medical Director for [facility].  Dr. Vida Rigger is Medical Director for [facility].

## 2023-11-30 ENCOUNTER — Other Ambulatory Visit

## 2023-12-01 LAB — HEPATIC FUNCTION PANEL
ALT: 11 IU/L (ref 0–44)
AST: 21 IU/L (ref 0–40)
Albumin: 4.7 g/dL (ref 3.8–4.9)
Alkaline Phosphatase: 101 IU/L (ref 44–121)
Bilirubin Total: 0.4 mg/dL (ref 0.0–1.2)
Bilirubin, Direct: 0.1 mg/dL (ref 0.00–0.40)
Total Protein: 7 g/dL (ref 6.0–8.5)

## 2023-12-01 LAB — CBC WITH DIFF/PLATELET
Basophils Absolute: 0.1 10*3/uL (ref 0.0–0.2)
Basos: 1 %
EOS (ABSOLUTE): 0.1 10*3/uL (ref 0.0–0.4)
Eos: 1 %
Hematocrit: 46.2 % (ref 37.5–51.0)
Hemoglobin: 14.5 g/dL (ref 13.0–17.7)
Immature Grans (Abs): 0 10*3/uL (ref 0.0–0.1)
Immature Granulocytes: 0 %
Lymphocytes Absolute: 1.8 10*3/uL (ref 0.7–3.1)
Lymphs: 31 %
MCH: 25.4 pg — ABNORMAL LOW (ref 26.6–33.0)
MCHC: 31.4 g/dL — ABNORMAL LOW (ref 31.5–35.7)
MCV: 81 fL (ref 79–97)
Monocytes Absolute: 0.7 10*3/uL (ref 0.1–0.9)
Monocytes: 12 %
Neutrophils Absolute: 3.3 10*3/uL (ref 1.4–7.0)
Neutrophils: 55 %
Platelets: 194 10*3/uL (ref 150–450)
RBC: 5.71 x10E6/uL (ref 4.14–5.80)
RDW: 13 % (ref 11.6–15.4)
WBC: 6 10*3/uL (ref 3.4–10.8)

## 2023-12-01 LAB — LIPID PANEL
Chol/HDL Ratio: 2.1 ratio (ref 0.0–5.0)
Cholesterol, Total: 112 mg/dL (ref 100–199)
HDL: 53 mg/dL (ref >39)
LDL Chol Calc (NIH): 43 mg/dL (ref 0–99)
Triglycerides: 78 mg/dL (ref 0–149)
VLDL Cholesterol Cal: 16 mg/dL (ref 5–40)

## 2023-12-01 LAB — HEMOGLOBIN A1C
Est. average glucose Bld gHb Est-mCnc: 105 mg/dL
Hgb A1c MFr Bld: 5.3 % (ref 4.8–5.6)

## 2023-12-04 ENCOUNTER — Encounter: Payer: Self-pay | Admitting: Internal Medicine

## 2023-12-04 ENCOUNTER — Encounter: Payer: 59 | Admitting: *Deleted

## 2023-12-04 ENCOUNTER — Ambulatory Visit (INDEPENDENT_AMBULATORY_CARE_PROVIDER_SITE_OTHER): Payer: 59 | Admitting: Internal Medicine

## 2023-12-04 VITALS — BP 117/77 | HR 102 | Ht 66.0 in | Wt 164.0 lb

## 2023-12-04 DIAGNOSIS — I213 ST elevation (STEMI) myocardial infarction of unspecified site: Secondary | ICD-10-CM

## 2023-12-04 DIAGNOSIS — I10 Essential (primary) hypertension: Secondary | ICD-10-CM

## 2023-12-04 DIAGNOSIS — E119 Type 2 diabetes mellitus without complications: Secondary | ICD-10-CM | POA: Insufficient documentation

## 2023-12-04 DIAGNOSIS — E782 Mixed hyperlipidemia: Secondary | ICD-10-CM | POA: Diagnosis not present

## 2023-12-04 DIAGNOSIS — I2510 Atherosclerotic heart disease of native coronary artery without angina pectoris: Secondary | ICD-10-CM | POA: Diagnosis not present

## 2023-12-04 DIAGNOSIS — I2583 Coronary atherosclerosis due to lipid rich plaque: Secondary | ICD-10-CM

## 2023-12-04 DIAGNOSIS — E7800 Pure hypercholesterolemia, unspecified: Secondary | ICD-10-CM

## 2023-12-04 MED ORDER — METFORMIN HCL 850 MG PO TABS: 850.0000 mg | ORAL_TABLET | Freq: Two times a day (BID) | ORAL | 0 refills | Status: DC

## 2023-12-04 NOTE — Progress Notes (Signed)
 Established Patient Office Visit  Subjective:  Patient ID: Brent Oneill, male    DOB: May 14, 1971  Age: 53 y.o. MRN: [id number]  Chief Complaint  Patient presents with   Follow-up    2 week follow up with labs prior    No new complaints, here for lab review and medication refills. Labs reviewed and notable for well controlled diabetes, A1c now at target, lipids at target with unremarkable cmp.     No other concerns at this time.   Past Medical History:  Diagnosis Date   Allergic rhinitis 03/14/2016   Asthma, persistent 03/14/2016    Past Surgical History:  Procedure Laterality Date   CORONARY/GRAFT ACUTE MI REVASCULARIZATION N/A 09/09/2023   Procedure: Coronary/Graft Acute MI Revascularization;  Surgeon: Alwyn Pea, MD;  Location: [facility]IVE [facility];  Service: Cardiovascular;  Laterality: N/A;   LEFT HEART CATH AND CORONARY ANGIOGRAPHY N/A 09/09/2023   Procedure: LEFT HEART CATH AND CORONARY ANGIOGRAPHY;  Surgeon: Alwyn Pea, MD;  Location: [facility] CV [facility];  Service: Cardiovascular;  Laterality: N/A;   LEFT HEART CATH AND CORONARY ANGIOGRAPHY N/A 09/11/2023   Procedure: LEFT HEART CATH AND CORONARY ANGIOGRAPHY;  Surgeon: Alwyn Pea, MD;  Location: [facility] CV [facility];  Service: Cardiovascular;  Laterality: N/A;   NO PAST SURGERIES      Social History   Socioeconomic History   Marital status: Married    Spouse name: Not on file   Number of children: Not on file   Years of education: Not on file   Highest education level: Not on file  Occupational History   Not on file  Tobacco Use   Smoking status: Never   Smokeless tobacco: Never  Vaping Use   Vaping status: Never Used  Substance and Sexual Activity   Alcohol use: Yes    Alcohol/week: 0.0 standard drinks of alcohol   Drug use: No   Sexual activity: Not on file  Other Topics Concern   Not on file  Social History Narrative   Not on file   Social Drivers of Health   Financial  Resource Strain: Not on file  Food Insecurity: No Food Insecurity (09/09/2023)   Hunger Vital Sign    Worried About Running Out of Food in the Last Year: Never true    Ran Out of Food in the Last Year: Never true  Transportation Needs: No Transportation Needs (09/09/2023)   PRAPARE - Administrator, Civil Service (Medical): No    Lack of Transportation (Non-Medical): No  Physical Activity: Not on file  Stress: Not on file  Social Connections: Not on file  Intimate Partner Violence: Not At Risk (09/09/2023)   Humiliation, Afraid, Rape, and Kick questionnaire    Fear of Current or Ex-Partner: No    Emotionally Abused: No    Physically Abused: No    Sexually Abused: No    Family History  Problem Relation Age of Onset   Kidney cancer Neg Hx    Prostate cancer Neg Hx     Allergies  Allergen Reactions   Quinine Derivatives Itching    Outpatient Medications Prior to Visit  Medication Sig   acetaminophen (TYLENOL) 325 MG tablet Take 2 tablets (650 mg total) by mouth every 6 (six) hours as needed for mild pain (pain score 1-3) (or Fever >/= 101).   albuterol (VENTOLIN HFA) 108 (90 Base) MCG/ACT inhaler Inhale 2 puffs into the lungs every 6 (six) hours as needed for wheezing or shortness  of breath.   aspirin 81 MG chewable tablet Chew 1 tablet (81 mg total) by mouth daily.   clopidogrel (PLAVIX) 75 MG tablet Take 1 tablet (75 mg total) by mouth daily with breakfast.   fluticasone (FLONASE) 50 MCG/ACT nasal spray SPRAY 2 SPRAYS INTO EACH NOSTRIL EVERY DAY   fluticasone furoate-vilanterol (BREO ELLIPTA) 200-25 MCG/ACT AEPB Inhale 1 puff into the lungs daily.   metoprolol tartrate (LOPRESSOR) 50 MG tablet Take 1 tablet (50 mg total) by mouth 2 (two) times daily.   rosuvastatin (CRESTOR) 40 MG tablet Take 1 tablet (40 mg total) by mouth daily.   tamsulosin (FLOMAX) 0.4 MG CAPS capsule Take 1 capsule (0.4 mg total) by mouth daily.   Vitamin D, Ergocalciferol, (DRISDOL) 1.25 MG  (50000 UNIT) CAPS capsule Take 50,000 Units by mouth every 7 (seven) days. (Patient not taking: Reported on 10/11/2023)   [DISCONTINUED] ibuprofen (ADVIL) 800 MG tablet Take 1 tablet (800 mg total) by mouth every 8 (eight) hours as needed. (Patient not taking: Reported on 11/17/2023)   [DISCONTINUED] metFORMIN (GLUCOPHAGE) 1000 MG tablet Take 1 tablet (1,000 mg total) by mouth 2 (two) times daily with a meal.   No facility-administered medications prior to visit.    Review of Systems  Constitutional:  Positive for weight loss (7 lbs).  HENT: Negative.    Eyes: Negative.   Respiratory: Negative.    Cardiovascular: Negative.   Gastrointestinal: Negative.   Genitourinary: Negative.   Musculoskeletal:  Positive for joint pain.  Skin: Negative.   Neurological: Negative.   Endo/Heme/Allergies: Negative.        Objective:   BP 117/77   Pulse (!) 102   Ht 5\' 6"  (1.676 m)   Wt 164 lb (74.4 kg)   SpO2 98%   BMI 26.47 kg/m   Vitals:   12/04/23 1418  BP: 117/77  Pulse: (!) 102  Height: 5\' 6"  (1.676 m)  Weight: 164 lb (74.4 kg)  SpO2: 98%  BMI (Calculated): 26.48    Physical Exam Vitals reviewed.  Constitutional:      Appearance: Normal appearance.  HENT:     Head: Normocephalic.     Left Ear: There is no impacted cerumen.     Nose: Nose normal.     Mouth/Throat:     Mouth: Mucous membranes are moist.     Pharynx: No posterior oropharyngeal erythema.  Eyes:     Extraocular Movements: Extraocular movements intact.     Pupils: Pupils are equal, round, and reactive to light.  Cardiovascular:     Rate and Rhythm: Regular rhythm.     Chest Wall: PMI is not displaced.     Pulses: Normal pulses.     Heart sounds: Normal heart sounds. No murmur heard. Pulmonary:     Effort: Pulmonary effort is normal.     Breath sounds: Normal air entry. No rhonchi or rales.  Abdominal:     General: Abdomen is flat. Bowel sounds are normal. There is no distension.     Palpations: Abdomen  is soft. There is no hepatomegaly, splenomegaly or mass.     Tenderness: There is no abdominal tenderness.  Musculoskeletal:        General: Normal range of motion.     Cervical back: Normal range of motion and neck supple.     Right hip: Normal. Normal range of motion.     Right lower leg: No edema.     Left lower leg: No edema.     Comments: Tenderness left  groin, pain exacerbated by flexion and internal rotation of thigh  Skin:    General: Skin is warm and dry.  Neurological:     General: No focal deficit present.     Mental Status: He is alert and oriented to person, place, and time.     Cranial Nerves: No cranial nerve deficit.     Motor: No weakness.  Psychiatric:        Mood and Affect: Mood normal.        Behavior: Behavior normal.      No results found for any visits on 12/04/23.  Recent Results (from the past 2160 hours)  CBC with Differential/Platelet     Status: Abnormal   Collection Time: 09/09/23 11:01 AM  Result Value Ref Range   WBC 9.6 4.0 - 10.5 K/uL   RBC 5.34 4.22 - 5.81 MIL/uL   Hemoglobin 14.5 13.0 - 17.0 g/dL   HCT 16.1 09.6 - 04.5 %   MCV 79.8 (L) 80.0 - 100.0 fL   MCH 27.2 26.0 - 34.0 pg   MCHC 34.0 30.0 - 36.0 g/dL   RDW 40.9 81.1 - 91.4 %   Platelets 218 150 - 400 K/uL   nRBC 0.0 0.0 - 0.2 %   Neutrophils Relative % 58 %   Neutro Abs 5.6 1.7 - 7.7 K/uL   Lymphocytes Relative 31 %   Lymphs Abs 3.0 0.7 - 4.0 K/uL   Monocytes Relative 9 %   Monocytes Absolute 0.9 0.1 - 1.0 K/uL   Eosinophils Relative 1 %   Eosinophils Absolute 0.1 0.0 - 0.5 K/uL   Basophils Relative 0 %   Basophils Absolute 0.0 0.0 - 0.1 K/uL   Immature Granulocytes 1 %   Abs Immature Granulocytes 0.09 (H) 0.00 - 0.07 K/uL    Comment: Performed at [facility], [address] [place], [place] [address]  Protime-INR     Status: None   Collection Time: 09/09/23 11:01 AM  Result Value Ref Range   Prothrombin Time 14.3 11.4 - 15.2 seconds   INR 1.1 0.8 - 1.2     Comment: (NOTE) INR goal varies based on device and disease states. Performed at [facility], [address] [place], [place] [address]   APTT     Status: Abnormal   Collection Time: 09/09/23 11:01 AM  Result Value Ref Range   aPTT 22 (L) 24 - 36 seconds    Comment: Performed at [facility], [address] [place], [place] [address]  Comprehensive metabolic panel     Status: Abnormal   Collection Time: 09/09/23 11:01 AM  Result Value Ref Range   Sodium 136 135 - 145 mmol/L   Potassium 2.7 (LL) 3.5 - 5.1 mmol/L    Comment: CRITICAL RESULT CALLED TO, READ BACK BY AND VERIFIED WITH PAUL [facility] AT 1134 09/09/23.PMF    Chloride 97 (L) 98 - 111 mmol/L   CO2 25 22 - 32 mmol/L   Glucose, Bld 201 (H) 70 - 99 mg/dL    Comment: Glucose reference range applies only to samples taken after fasting for at least 8 hours.   BUN 15 6 - 20 mg/dL   Creatinine, Ser 4.69 0.61 - 1.24 mg/dL   Calcium 9.5 8.9 - 62.9 mg/dL   Total Protein 7.0 6.5 - 8.1 g/dL   Albumin 4.2 3.5 - 5.0 g/dL   AST 20 15 - 41 U/L   ALT 17 0 - 44 U/L   Alkaline Phosphatase 51 38 -  126 U/L   Total Bilirubin 1.1 <1.2 mg/dL   GFR, Estimated >10 >96 mL/min    Comment: (NOTE) Calculated using the CKD-EPI Creatinine Equation (2021)    Anion gap 14 5 - 15    Comment: Performed at [facility], [address] [place], [place] [address]  Troponin I (High Sensitivity)     Status: None   Collection Time: 09/09/23 11:01 AM  Result Value Ref Range   Troponin I (High Sensitivity) 9 <18 ng/L    Comment: (NOTE) Elevated high sensitivity troponin I (hsTnI) values and significant  changes across serial measurements may suggest ACS but many other  chronic and acute conditions are known to elevate hsTnI results.  Refer to the "Links" section for chest pain algorithms and additional  guidance. Performed at [facility], [address] [place], [place] [address]   Lipid panel     Status:  Abnormal   Collection Time: 09/09/23 11:01 AM  Result Value Ref Range   Cholesterol 237 (H) 0 - 200 mg/dL   Triglycerides 147 (H) <150 mg/dL   HDL 48 >82 mg/dL   Total CHOL/HDL Ratio 4.9 RATIO   VLDL 43 (H) 0 - 40 mg/dL   LDL Cholesterol 956 (H) 0 - 99 mg/dL    Comment:        Total Cholesterol/HDL:CHD Risk Coronary Heart Disease Risk Table                     Men   Women  1/2 Average Risk   3.4   3.3  Average Risk       5.0   4.4  2 X Average Risk   9.6   7.1  3 X Average Risk  23.4   11.0        Use the calculated Patient Ratio above and the CHD Risk Table to determine the patient'Tomeka Kantner CHD Risk.        ATP III CLASSIFICATION (LDL):  <100     mg/dL   Optimal  213-086  mg/dL   Near or Above                    Optimal  130-159  mg/dL   Borderline  578-469  mg/dL   High  >629     mg/dL   Very High Performed at [facility], [address] [place], [place] [address]   Hemoglobin A1c     Status: Abnormal   Collection Time: 09/09/23 11:01 AM  Result Value Ref Range   Hgb A1c MFr Bld 6.8 (H) 4.8 - 5.6 %    Comment: (NOTE) Pre diabetes:          5.7%-6.4%  Diabetes:              >6.4%  Glycemic control for   <7.0% adults with diabetes    Mean Plasma Glucose 148.46 mg/dL    Comment: Performed at [facility], [address] [place], [place] [address]  CARDIAC CATHETERIZATION     Status: None   Collection Time: 09/09/23 11:20 AM  Result Value Ref Range   Cath EF Quantitative 60 %  CG4 I-STAT (Lactic acid)     Status: Abnormal   Collection Time: 09/09/23 11:37 AM  Result Value Ref Range   Lactic Acid, Venous 2.1 (HH) 0.5 - 1.9 mmol/L   Comment NOTIFIED PHYSICIAN   POCT Activated clotting time     Status: None   Collection  Time: 09/09/23 11:40 AM  Result Value Ref Range   Activated Clotting Time 337 seconds    Comment: Reference range 74-137 seconds for patients not on anticoagulant therapy.  POCT Activated clotting time     Status: None   Collection  Time: 09/09/23 12:16 PM  Result Value Ref Range   Activated Clotting Time 297 seconds    Comment: Reference range 74-137 seconds for patients not on anticoagulant therapy.  Glucose, capillary     Status: Abnormal   Collection Time: 09/09/23  1:02 PM  Result Value Ref Range   Glucose-Capillary 185 (H) 70 - 99 mg/dL    Comment: Glucose reference range applies only to samples taken after fasting for at least 8 hours.  MRSA Next Gen by PCR, Nasal     Status: None   Collection Time: 09/09/23  1:25 PM   Specimen: Nasal Mucosa; Nasal Swab  Result Value Ref Range   MRSA by PCR Next Gen NOT DETECTED NOT DETECTED    Comment: (NOTE) The GeneXpert MRSA Assay (FDA approved for NASAL specimens only), is one component of a comprehensive MRSA colonization surveillance program. It is not intended to diagnose MRSA infection nor to guide or monitor treatment for MRSA infections. Test performance is not FDA approved in patients less than 50 years old. Performed at [facility], [address] [place], [place] [address]   Troponin I (High Sensitivity)     Status: Abnormal   Collection Time: 09/09/23  1:26 PM  Result Value Ref Range   Troponin I (High Sensitivity) 1,432 (HH) <18 ng/L    Comment: CRITICAL RESULT CALLED TO, READ BACK BY AND VERIFIED WITH ASHLEY GRAHAM AT 1440 09/09/23.PMF (NOTE) Elevated high sensitivity troponin I (hsTnI) values and significant  changes across serial measurements may suggest ACS but many other  chronic and acute conditions are known to elevate hsTnI results.  Refer to the "Links" section for chest pain algorithms and additional  guidance. Performed at [facility], [address] [place], [place] [address]   Magnesium     Status: Abnormal   Collection Time: 09/09/23  1:26 PM  Result Value Ref Range   Magnesium 1.0 (L) 1.7 - 2.4 mg/dL    Comment: Performed at [facility], [address] [place], [place] [address]  Phosphorus      Status: Abnormal   Collection Time: 09/09/23  1:26 PM  Result Value Ref Range   Phosphorus 1.2 (L) 2.5 - 4.6 mg/dL    Comment: Performed at [facility], [address] [place], [place] [address]  Troponin I (High Sensitivity)     Status: Abnormal   Collection Time: 09/09/23  3:17 PM  Result Value Ref Range   Troponin I (High Sensitivity) 7,337 (HH) <18 ng/L    Comment: CRITICAL VALUE NOTED. VALUE IS CONSISTENT WITH PREVIOUSLY REPORTED/CALLED VALUE. QSD (NOTE) Elevated high sensitivity troponin I (hsTnI) values and significant  changes across serial measurements may suggest ACS but many other  chronic and acute conditions are known to elevate hsTnI results.  Refer to the "Links" section for chest pain algorithms and additional  guidance. Performed at [facility], [address] [place], [place] [address]   Potassium     Status: None   Collection Time: 09/09/23  8:17 PM  Result Value Ref Range   Potassium 3.9 3.5 - 5.1 mmol/L    Comment: Performed at [facility], [address] [place], [place] [address]  HIV Antibody (routine testing  w rflx)     Status: None   Collection Time: 09/09/23  8:17 PM  Result Value Ref Range   HIV Screen 4th Generation wRfx Non Reactive Non Reactive    Comment: Performed at [facility], [address], [place], [place] [address]  Platelet count     Status: None   Collection Time: 09/09/23  8:17 PM  Result Value Ref Range   Platelets 197 150 - 400 K/uL    Comment: Performed at [facility], [address] [place], [place] [address]  Troponin I (High Sensitivity)     Status: Abnormal   Collection Time: 09/09/23 11:07 PM  Result Value Ref Range   Troponin I (High Sensitivity) 17,179 (HH) <18 ng/L    Comment: CRITICAL VALUE NOTED. VALUE IS CONSISTENT WITH PREVIOUSLY REPORTED/CALLED VALUE B[facility] (NOTE) Elevated high sensitivity troponin I (hsTnI) values and significant  changes across serial measurements  may suggest ACS but many other  chronic and acute conditions are known to elevate hsTnI results.  Refer to the "Links" section for chest pain algorithms and additional  guidance. Performed at [facility], [address] [place], [place] [address]   Basic metabolic panel     Status: Abnormal   Collection Time: 09/10/23  3:34 AM  Result Value Ref Range   Sodium 133 (L) 135 - 145 mmol/L   Potassium 3.7 3.5 - 5.1 mmol/L   Chloride 97 (L) 98 - 111 mmol/L   CO2 25 22 - 32 mmol/L   Glucose, Bld 171 (H) 70 - 99 mg/dL    Comment: Glucose reference range applies only to samples taken after fasting for at least 8 hours.   BUN 11 6 - 20 mg/dL   Creatinine, Ser 8.29 0.61 - 1.24 mg/dL   Calcium 8.7 (L) 8.9 - 10.3 mg/dL   GFR, Estimated >56 >21 mL/min    Comment: (NOTE) Calculated using the CKD-EPI Creatinine Equation (2021)    Anion gap 11 5 - 15    Comment: Performed at [facility], [address] [place], [place] [address]  CBC     Status: Abnormal   Collection Time: 09/10/23  3:34 AM  Result Value Ref Range   WBC 10.8 (H) 4.0 - 10.5 K/uL   RBC 4.57 4.22 - 5.81 MIL/uL   Hemoglobin 12.5 (L) 13.0 - 17.0 g/dL   HCT 78.4 (L) 69.6 - 29.5 %   MCV 77.7 (L) 80.0 - 100.0 fL   MCH 27.4 26.0 - 34.0 pg   MCHC 35.2 30.0 - 36.0 g/dL   RDW 28.4 13.2 - 44.0 %   Platelets 186 150 - 400 K/uL   nRBC 0.0 0.0 - 0.2 %    Comment: Performed at [facility], [address], [place], [place] [address]  Lipoprotein A (LPA)     Status: Abnormal   Collection Time: 09/10/23  3:34 AM  Result Value Ref Range   Lipoprotein (a) 35.1 (H) <75.0 nmol/L    Comment: (NOTE) Note:  Values greater than or equal to 75.0 nmol/L may       indicate an independent risk factor for CHD,       but must be evaluated with caution when applied       to non-Caucasian populations due to the       influence of genetic factors on Lp(a) across       ethnicities. Performed At: [facility] [address] [place], [place] [id number] Jolene Schimke  MD [id number]   Magnesium     Status: None   Collection Time: 09/10/23  3:34 AM  Result Value Ref Range   Magnesium 2.1 1.7 - 2.4 mg/dL    Comment: Performed at [facility], [address] [place], [place] [address]  Phosphorus     Status: None   Collection Time: 09/10/23  3:34 AM  Result Value Ref Range   Phosphorus 3.7 2.5 - 4.6 mg/dL    Comment: Performed at [facility], [address] [place], [place] [address]  Troponin I (High Sensitivity)     Status: Abnormal   Collection Time: 09/10/23  7:11 AM  Result Value Ref Range   Troponin I (High Sensitivity) 12,533 (HH) <18 ng/L    Comment: CRITICAL VALUE NOTED. VALUE IS CONSISTENT WITH PREVIOUSLY REPORTED/CALLED VALUE SRR (NOTE) Elevated high sensitivity troponin I (hsTnI) values and significant  changes across serial measurements may suggest ACS but many other  chronic and acute conditions are known to elevate hsTnI results.  Refer to the "Links" section for chest pain algorithms and additional  guidance. Performed at [facility], [address] [place], [place] [address]   Troponin I (High Sensitivity)     Status: Abnormal   Collection Time: 09/10/23  9:05 AM  Result Value Ref Range   Troponin I (High Sensitivity) 13,870 (HH) <18 ng/L    Comment: CRITICAL VALUE NOTED. VALUE IS CONSISTENT WITH PREVIOUSLY REPORTED/CALLED VALUE SRR (NOTE) Elevated high sensitivity troponin I (hsTnI) values and significant  changes across serial measurements may suggest ACS but many other  chronic and acute conditions are known to elevate hsTnI results.  Refer to the "Links" section for chest pain algorithms and additional  guidance. Performed at [facility], [address] [place], [place] [address]   ECHOCARDIOGRAM COMPLETE     Status: None   Collection Time: 09/10/23  9:56 AM  Result Value Ref Range   BP 124/83 mmHg    Ao pk vel 1.48 m/Jossette Zirbel   AR max vel 2.63 cm2   AV Peak grad 8.8 mmHg   Chaze Hruska' Lateral 2.60 cm   Area-P 1/2 4.19 cm2   Est EF 60 - 65%   Protime-INR     Status: None   Collection Time: 09/10/23  1:48 PM  Result Value Ref Range   Prothrombin Time 14.7 11.4 - 15.2 seconds   INR 1.1 0.8 - 1.2    Comment: (NOTE) INR goal varies based on device and disease states. Performed at [facility], [address] [place], [place] [address]   APTT     Status: Abnormal   Collection Time: 09/10/23  1:48 PM  Result Value Ref Range   aPTT 75 (H) 24 - 36 seconds    Comment:        IF BASELINE aPTT IS ELEVATED, SUGGEST PATIENT RISK ASSESSMENT BE USED TO DETERMINE APPROPRIATE ANTICOAGULANT THERAPY. Performed at [facility], [address] [place], [place] [address]   Heparin level (unfractionated)     Status: None   Collection Time: 09/10/23  7:26 PM  Result Value Ref Range   Heparin Unfractionated 0.69 0.30 - 0.70 IU/mL    Comment: (NOTE) The clinical reportable range upper limit is being lowered to >1.10 to align with the FDA approved guidance for the current laboratory assay.  If heparin results are below expected values, and patient dosage has  been confirmed, suggest follow up testing of antithrombin III levels. Performed at [facility],  [address] [place], [place] [address]   Heparin level (unfractionated)     Status: None   Collection Time: 09/11/23  1:41 AM  Result Value Ref Range   Heparin Unfractionated 0.49 0.30 - 0.70 IU/mL    Comment: (NOTE) The clinical reportable range upper limit is being lowered to >1.10 to align with the FDA approved guidance for the current laboratory assay.  If heparin results are below expected values, and patient dosage has  been confirmed, suggest follow up testing of antithrombin III levels. Performed at [facility], [address] [place], [place] [address]   CBC     Status: Abnormal   Collection  Time: 09/11/23  4:37 AM  Result Value Ref Range   WBC 11.1 (H) 4.0 - 10.5 K/uL   RBC 5.04 4.22 - 5.81 MIL/uL   Hemoglobin 13.7 13.0 - 17.0 g/dL   HCT 69.6 29.5 - 28.4 %   MCV 78.8 (L) 80.0 - 100.0 fL   MCH 27.2 26.0 - 34.0 pg   MCHC 34.5 30.0 - 36.0 g/dL   RDW 13.2 44.0 - 10.2 %   Platelets 220 150 - 400 K/uL   nRBC 0.0 0.0 - 0.2 %    Comment: Performed at [facility], [address] [place], [place] [address]  CARDIAC CATHETERIZATION     Status: None   Collection Time: 09/11/23  1:48 PM  Result Value Ref Range   Cath EF Quantitative 50 %  Basic metabolic panel     Status: Abnormal   Collection Time: 09/11/23  3:40 PM  Result Value Ref Range   Sodium 126 (L) 135 - 145 mmol/L    Comment: REPEATED TO VERIFY   Potassium 3.9 3.5 - 5.1 mmol/L   Chloride 91 (L) 98 - 111 mmol/L   CO2 25 22 - 32 mmol/L   Glucose, Bld 117 (H) 70 - 99 mg/dL    Comment: Glucose reference range applies only to samples taken after fasting for at least 8 hours.   BUN 9 6 - 20 mg/dL   Creatinine, Ser 6.44 0.61 - 1.24 mg/dL   Calcium 9.0 8.9 - 03.4 mg/dL   GFR, Estimated >74 >25 mL/min    Comment: (NOTE) Calculated using the CKD-EPI Creatinine Equation (2021)    Anion gap 10 5 - 15    Comment: Performed at [facility], [address] [place], [place] [address]  Osmolality     Status: Abnormal   Collection Time: 09/11/23  5:46 PM  Result Value Ref Range   Osmolality 273 (L) 275 - 295 mOsm/kg    Comment: REPEATED TO VERIFY Performed at [facility], [address] [place], [place] [address]   Glucose, capillary     Status: Abnormal   Collection Time: 09/11/23  9:11 PM  Result Value Ref Range   Glucose-Capillary 164 (H) 70 - 99 mg/dL    Comment: Glucose reference range applies only to samples taken after fasting for at least 8 hours.  Sodium, urine, random     Status: None   Collection Time: 09/11/23 11:27 PM  Result Value Ref Range   Sodium, Ur 14 mmol/L     Comment: Performed at [facility], [address] [place], [place] [address]  Osmolality, urine     Status: Abnormal   Collection Time: 09/11/23 11:27 PM  Result Value Ref Range   Osmolality, Ur 94 (L) 300 - 900 mOsm/kg    Comment: REPEATED TO VERIFY Performed at  [facility], [address] [place], [place] [address]   CBC with Differential/Platelet     Status: Abnormal   Collection Time: 09/12/23  4:22 AM  Result Value Ref Range   WBC 9.4 4.0 - 10.5 K/uL   RBC 5.13 4.22 - 5.81 MIL/uL   Hemoglobin 13.9 13.0 - 17.0 g/dL   HCT 60.4 54.0 - 98.1 %   MCV 76.6 (L) 80.0 - 100.0 fL   MCH 27.1 26.0 - 34.0 pg   MCHC 35.4 30.0 - 36.0 g/dL   RDW 19.1 47.8 - 29.5 %   Platelets 192 150 - 400 K/uL   nRBC 0.0 0.0 - 0.2 %   Neutrophils Relative % 78 %   Neutro Abs 7.3 1.7 - 7.7 K/uL   Lymphocytes Relative 9 %   Lymphs Abs 0.9 0.7 - 4.0 K/uL   Monocytes Relative 12 %   Monocytes Absolute 1.1 (H) 0.1 - 1.0 K/uL   Eosinophils Relative 0 %   Eosinophils Absolute 0.0 0.0 - 0.5 K/uL   Basophils Relative 0 %   Basophils Absolute 0.0 0.0 - 0.1 K/uL   Immature Granulocytes 1 %   Abs Immature Granulocytes 0.05 0.00 - 0.07 K/uL    Comment: Performed at [facility], [address], [place], [place] [address]  Basic metabolic panel     Status: Abnormal   Collection Time: 09/12/23  4:22 AM  Result Value Ref Range   Sodium 128 (L) 135 - 145 mmol/L   Potassium 4.3 3.5 - 5.1 mmol/L   Chloride 99 98 - 111 mmol/L   CO2 24 22 - 32 mmol/L   Glucose, Bld 132 (H) 70 - 99 mg/dL    Comment: Glucose reference range applies only to samples taken after fasting for at least 8 hours.   BUN 14 6 - 20 mg/dL   Creatinine, Ser 8.65 0.61 - 1.24 mg/dL   Calcium 9.0 8.9 - 78.4 mg/dL   GFR, Estimated >69 >62 mL/min    Comment: (NOTE) Calculated using the CKD-EPI Creatinine Equation (2021)    Anion gap 5 5 - 15    Comment: Performed at [facility], [address]  [place], [place] [address]  CBC with Differential/Platelet     Status: Abnormal   Collection Time: 09/13/23  6:13 AM  Result Value Ref Range   WBC 8.5 4.0 - 10.5 K/uL   RBC 4.92 4.22 - 5.81 MIL/uL   Hemoglobin 13.3 13.0 - 17.0 g/dL   HCT 13.2 (L) 44.0 - 10.2 %   MCV 79.1 (L) 80.0 - 100.0 fL   MCH 27.0 26.0 - 34.0 pg   MCHC 34.2 30.0 - 36.0 g/dL   RDW 72.5 36.6 - 44.0 %   Platelets 220 150 - 400 K/uL   nRBC 0.0 0.0 - 0.2 %   Neutrophils Relative % 71 %   Neutro Abs 6.1 1.7 - 7.7 K/uL   Lymphocytes Relative 14 %   Lymphs Abs 1.2 0.7 - 4.0 K/uL   Monocytes Relative 13 %   Monocytes Absolute 1.1 (H) 0.1 - 1.0 K/uL   Eosinophils Relative 1 %   Eosinophils Absolute 0.1 0.0 - 0.5 K/uL   Basophils Relative 0 %   Basophils Absolute 0.0 0.0 - 0.1 K/uL   Immature Granulocytes 1 %   Abs Immature Granulocytes 0.05 0.00 - 0.07 K/uL    Comment: Performed at [facility], [address] [place], [place] [address]  Basic metabolic panel  Status: Abnormal   Collection Time: 09/13/23  6:13 AM  Result Value Ref Range   Sodium 134 (L) 135 - 145 mmol/L   Potassium 4.2 3.5 - 5.1 mmol/L   Chloride 100 98 - 111 mmol/L   CO2 24 22 - 32 mmol/L   Glucose, Bld 114 (H) 70 - 99 mg/dL    Comment: Glucose reference range applies only to samples taken after fasting for at least 8 hours.   BUN 14 6 - 20 mg/dL   Creatinine, Ser 8.29 (H) 0.61 - 1.24 mg/dL   Calcium 9.5 8.9 - 56.2 mg/dL   GFR, Estimated >13 >08 mL/min    Comment: (NOTE) Calculated using the CKD-EPI Creatinine Equation (2021)    Anion gap 10 5 - 15    Comment: Performed at [facility], [address] [place], [place] [address]  CBC With Diff/Platelet     Status: Abnormal   Collection Time: 11/30/23  8:44 AM  Result Value Ref Range   WBC 6.0 3.4 - 10.8 x10E3/uL   RBC 5.71 4.14 - 5.80 x10E6/uL   Hemoglobin 14.5 13.0 - 17.7 g/dL   Hematocrit 69.6 29.5 - 51.0 %   MCV 81 79 - 97 fL   MCH 25.4 (L) 26.6 - 33.0 pg   MCHC  31.4 (L) 31.5 - 35.7 g/dL   RDW 28.4 13.2 - 44.0 %   Platelets 194 150 - 450 x10E3/uL   Neutrophils 55 Not Estab. %   Lymphs 31 Not Estab. %   Monocytes 12 Not Estab. %   Eos 1 Not Estab. %   Basos 1 Not Estab. %   Neutrophils Absolute 3.3 1.4 - 7.0 x10E3/uL   Lymphocytes Absolute 1.8 0.7 - 3.1 x10E3/uL   Monocytes Absolute 0.7 0.1 - 0.9 x10E3/uL   EOS (ABSOLUTE) 0.1 0.0 - 0.4 x10E3/uL   Basophils Absolute 0.1 0.0 - 0.2 x10E3/uL   Immature Granulocytes 0 Not Estab. %   Immature Grans (Abs) 0.0 0.0 - 0.1 x10E3/uL  Lipid panel     Status: None   Collection Time: 11/30/23  8:44 AM  Result Value Ref Range   Cholesterol, Total 112 100 - 199 mg/dL   Triglycerides 78 0 - 149 mg/dL   HDL 53 >10 mg/dL   VLDL Cholesterol Cal 16 5 - 40 mg/dL   LDL Chol Calc (NIH) 43 0 - 99 mg/dL   Chol/HDL Ratio 2.1 0.0 - 5.0 ratio    Comment:                                   T. Chol/HDL Ratio                                             Men  Women                               1/2 Avg.Risk  3.4    3.3                                   Avg.Risk  5.0    4.4  2X Avg.Risk  9.6    7.1                                3X Avg.Risk 23.4   11.0   Hepatic function panel     Status: None   Collection Time: 11/30/23  8:44 AM  Result Value Ref Range   Total Protein 7.0 6.0 - 8.5 g/dL   Albumin 4.7 3.8 - 4.9 g/dL   Bilirubin Total 0.4 0.0 - 1.2 mg/dL   Bilirubin, Direct 1.19 0.00 - 0.40 mg/dL   Alkaline Phosphatase 101 44 - 121 IU/L   AST 21 0 - 40 IU/L   ALT 11 0 - 44 IU/L  Hemoglobin A1c     Status: None   Collection Time: 11/30/23  8:44 AM  Result Value Ref Range   Hgb A1c MFr Bld 5.3 4.8 - 5.6 %    Comment:          Prediabetes: 5.7 - 6.4          Diabetes: >6.4          Glycemic control for adults with diabetes: <7.0    Est. average glucose Bld gHb Est-mCnc 105 mg/dL      Assessment & Plan:  As per problem list and reduce metformin. Problem List Items Addressed This  Visit       Cardiovascular and Mediastinum   Essential (primary) hypertension   Coronary artery disease due to lipid rich plaque     Endocrine   Controlled type 2 diabetes mellitus without complication, without long-term current use of insulin (HCC) - Primary   Relevant Medications   metFORMIN (GLUCOPHAGE) 850 MG tablet   Other Relevant Orders   Hemoglobin A1c   Lipid panel     Other   Pure hypercholesterolemia    Return in about 3 months (around 03/05/2024) for fu with labs prior.   Total time spent: 20 minutes  Luna Fuse, MD  12/04/2023   This document may have been prepared by [facility] Voice Recognition software and as such may include unintentional dictation errors.

## 2023-12-04 NOTE — Progress Notes (Signed)
 Daily Session Note  Patient Details  Name: Brent Oneill MRN: [id number] Date of Birth: Nov 22, 1970 Referring Provider:   Flowsheet Row Cardiac Rehab from 10/18/2023 in [facility]iac [facility]  Referring Provider Dr. Windell Norfolk       Encounter Date: 12/04/2023  Check In:  Session Check In - 12/04/23 1546       Check-In   Supervising physician immediately available to respond to emergencies See telemetry face sheet for immediately available ER MD    Location [facility]-Cardiac & Pulmonary Rehab    Staff Present Susann Givens RN,BSN;Joseph Hollace Kinnier;Cora Collum, RN, BSN, CCRP    Virtual Visit No    Medication changes reported     No    Fall or balance concerns reported    No    Warm-up and Cool-down Performed on first and last piece of equipment    Resistance Training Performed Yes    VAD Patient? No    PAD/SET Patient? No      Pain Assessment   Currently in Pain? No/denies                Social History   Tobacco Use  Smoking Status Never  Smokeless Tobacco Never    Goals Met:  Independence with exercise equipment Exercise tolerated well No report of concerns or symptoms today Strength training completed today  Goals Unmet:  Not Applicable  Comments: Pt able to follow exercise prescription today without complaint.  Will continue to monitor for progression.    Dr. Bethann Punches is Medical Director for [facility] Cardiac Rehabilitation.  Dr. Vida Rigger is Medical Director for [facility] Pulmonary Rehabilitation.

## 2023-12-06 ENCOUNTER — Encounter: Payer: 59 | Admitting: *Deleted

## 2023-12-06 ENCOUNTER — Encounter: Payer: Self-pay | Admitting: Internal Medicine

## 2023-12-06 DIAGNOSIS — I213 ST elevation (STEMI) myocardial infarction of unspecified site: Secondary | ICD-10-CM

## 2023-12-06 NOTE — Progress Notes (Signed)
 Cardiac Individual Treatment Plan  Patient Details  Name: Brent Oneill MRN: [id number] Date of Birth: April 26, 1971 Referring Provider:   Flowsheet Row Cardiac Rehab from 10/18/2023 in [facility] Cardiac and Pulmonary Rehab  Referring Provider Dr. Windell Norfolk       Initial Encounter Date:  Flowsheet Row Cardiac Rehab from 10/18/2023 in [facility] Cardiac and Pulmonary Rehab  Date 10/18/23       Visit Diagnosis: ST elevation myocardial infarction (STEMI), unspecified artery (HCC)  Patient's Home Medications on Admission:  Current Outpatient Medications:    acetaminophen (TYLENOL) 325 MG tablet, Take 2 tablets (650 mg total) by mouth every 6 (six) hours as needed for mild pain (pain score 1-3) (or Fever >/= 101)., Disp: 30 tablet, Rfl: 0   albuterol (VENTOLIN HFA) 108 (90 Base) MCG/ACT inhaler, Inhale 2 puffs into the lungs every 6 (six) hours as needed for wheezing or shortness of breath., Disp: , Rfl:    aspirin 81 MG chewable tablet, Chew 1 tablet (81 mg total) by mouth daily., Disp: 30 tablet, Rfl: 11   clopidogrel (PLAVIX) 75 MG tablet, Take 1 tablet (75 mg total) by mouth daily with breakfast., Disp: 90 tablet, Rfl: 11   fluticasone (FLONASE) 50 MCG/ACT nasal spray, SPRAY 2 SPRAYS INTO EACH NOSTRIL EVERY DAY, Disp: 48 mL, Rfl: 3   fluticasone furoate-vilanterol (BREO ELLIPTA) 200-25 MCG/ACT AEPB, Inhale 1 puff into the lungs daily., Disp: 30 each, Rfl: 2   metFORMIN (GLUCOPHAGE) 850 MG tablet, Take 1 tablet (850 mg total) by mouth 2 (two) times daily with a meal., Disp: 180 tablet, Rfl: 0   metoprolol tartrate (LOPRESSOR) 50 MG tablet, Take 1 tablet (50 mg total) by mouth 2 (two) times daily., Disp: 60 tablet, Rfl: 12   rosuvastatin (CRESTOR) 40 MG tablet, Take 1 tablet (40 mg total) by mouth daily., Disp: 30 tablet, Rfl: 11   tamsulosin (FLOMAX) 0.4 MG CAPS capsule, Take 1 capsule (0.4 mg total) by mouth daily., Disp: 30 capsule, Rfl: 1   Vitamin D, Ergocalciferol, (DRISDOL) 1.25 MG (50000  UNIT) CAPS capsule, Take 50,000 Units by mouth every 7 (seven) days. (Patient not taking: Reported on 10/11/2023), Disp: , Rfl:   Past Medical History: Past Medical History:  Diagnosis Date   Allergic rhinitis 03/14/2016   Asthma, persistent 03/14/2016    Tobacco Use: Social History   Tobacco Use  Smoking Status Never  Smokeless Tobacco Never    Labs: Review Flowsheet       Latest Ref Rng & Units 04/21/2023 08/11/2023 09/09/2023 11/30/2023  Labs for ITP Cardiac and Pulmonary Rehab  Cholestrol 100 - 199 mg/dL 409  811  914  782   LDL (calc) 0 - 99 mg/dL 956  213  086  43   HDL-C >39 mg/dL 45  55  48  53   Trlycerides 0 - 149 mg/dL 578  469  629  78   Hemoglobin A1c 4.8 - 5.6 % - 6.4  6.8  5.3      Exercise Target Goals: Exercise Program Goal: Individual exercise prescription set using results from initial 6 min walk test and THRR while considering  patient's activity barriers and safety.   Exercise Prescription Goal: Initial exercise prescription builds to 30-45 minutes a day of aerobic activity, 2-3 days per week.  Home exercise guidelines will be given to patient during program as part of exercise prescription that the participant will acknowledge.   Education: Aerobic Exercise: - Group verbal and visual presentation on the components of exercise prescription.  Introduces F.I.T.T principle from ACSM for exercise prescriptions.  Reviews F.I.T.T. principles of aerobic exercise including progression. Written material given at graduation.   Education: Resistance Exercise: - Group verbal and visual presentation on the components of exercise prescription. Introduces F.I.T.T principle from ACSM for exercise prescriptions  Reviews F.I.T.T. principles of resistance exercise including progression. Written material given at graduation.    Education: Exercise & Equipment Safety: - Individual verbal instruction and demonstration of equipment use and safety with use of the  equipment. Flowsheet Row Cardiac Rehab from 11/08/2023 in [facility] Cardiac and Pulmonary Rehab  Date 10/18/23  Educator MB  Instruction Review Code 1- Verbalizes Understanding       Education: Exercise Physiology & General Exercise Guidelines: - Group verbal and written instruction with models to review the exercise physiology of the cardiovascular system and associated critical values. Provides general exercise guidelines with specific guidelines to those with heart or lung disease.    Education: Flexibility, Balance, Mind/Body Relaxation: - Group verbal and visual presentation with interactive activity on the components of exercise prescription. Introduces F.I.T.T principle from ACSM for exercise prescriptions. Reviews F.I.T.T. principles of flexibility and balance exercise training including progression. Also discusses the mind body connection.  Reviews various relaxation techniques to help reduce and manage stress (i.e. Deep breathing, progressive muscle relaxation, and visualization). Balance handout provided to take home. Written material given at graduation.   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 HR 105 bpm     Max Ex. BP 114/70     2 Minute Post BP 108/76               Oxygen Initial Assessment:   Oxygen Re-Evaluation:   Oxygen Discharge (Final Oxygen Re-Evaluation):   Initial Exercise Prescription:  Initial Exercise Prescription - 10/18/23 1500       Date of Initial Exercise RX and Referring Provider   Date 10/18/23    Referring Provider Dr. Windell Norfolk      Oxygen   Maintain Oxygen Saturation 88% or higher      Treadmill   MPH 2.8    Grade 0.5    Minutes 15    METs 3.34      NuStep   Level 4    SPM 80    Minutes 15    METs 4.12      Elliptical   Level  1    Speed 3    Minutes 15    METs 4.12      REL-XR   Level 3    Speed 50    Minutes 15    METs 4.12      T5 Nustep   Level 4   T6   SPM 80    Minutes 15    METs 4.12      Prescription Details   Frequency (times per week) 3    Duration Progress to 30 minutes of continuous aerobic without signs/symptoms of physical distress      Intensity   THRR 40-80% of Max Heartrate 118-151    Ratings of Perceived Exertion 11-13    Perceived Dyspnea 0-4      Progression   Progression Continue to progress workloads to maintain intensity without signs/symptoms of physical distress.      Resistance Training   Training Prescription Yes    Weight 5 lb    Reps 10-15             Perform Capillary Blood Glucose checks as needed.  Exercise Prescription Changes:   Exercise Prescription Changes     Row Name 10/18/23 1500 11/22/23 1600 11/23/23 1600         Response to Exercise   Blood Pressure (Admit) 108/74 -- 140/72     Blood Pressure (Exercise) 114/70 -- 188/80     Blood Pressure (Exit) 108/76 -- 100/60     Heart Rate (Admit) 91 bpm -- 108 bpm     Heart Rate (Exercise) 105 bpm -- 140 bpm     Heart Rate (Exit) 85 bpm -- 87 bpm     Oxygen Saturation (Admit) 96 % -- --     Oxygen Saturation (Exercise) 95 % -- --     Oxygen Saturation (Exit) 98 % -- --     Rating of Perceived Exertion (Exercise) 11 -- 14     Perceived Dyspnea (Exercise) 0 --  --     Symptoms none -- none     Comments results -- First two weeks of rehab     Duration Progress to 30 minutes of  aerobic without signs/symptoms of physical distress -- Continue with 30 min of aerobic exercise without signs/symptoms of physical distress.     Intensity THRR New -- THRR unchanged       Progression   Progression Continue to progress workloads to maintain intensity without signs/symptoms of physical distress. -- Continue to progress workloads to maintain intensity without signs/symptoms of physical distress.     Average METs 4.12 -- 4.44       Resistance Training   Training Prescription -- -- Yes     Weight -- -- 5 lb     Reps -- -- 10-15       Interval Training   Interval Training -- -- No       Treadmill   MPH -- -- 3.5     Grade -- -- 2.5     Minutes -- -- 15     METs -- -- 4.89       NuStep   Level -- -- 5     Minutes -- -- 15       Elliptical   Level -- -- 3     Speed -- -- 3.7     Minutes -- -- 15     METs -- -- 3.1       REL-XR   Level -- --  8     Minutes -- -- 15       T5 Nustep   Level -- -- 5  T6 nustep     Minutes -- -- 15       Home Exercise Plan   Plans to continue exercise at -- Home (comment)  Treadmill, elliptical, stretching, might add bike and jogging outside, adding dumbbells/resistance training Home (comment)  Treadmill, elliptical, stretching, might add bike and jogging outside, adding dumbbells/resistance training     Frequency -- Add 3 additional days to program exercise sessions. Add 3 additional days to program exercise sessions.     Initial Home Exercises Provided -- 11/22/23 11/22/23       Oxygen   Maintain Oxygen Saturation -- -- 88% or higher              Exercise Comments:   Exercise Comments     Row Name 10/23/23 1550           Exercise Comments First full day of exercise!  Patient was oriented to gym and equipment including functions, settings, policies, and procedures.  Patient's individual  exercise prescription and treatment plan were reviewed.  All starting workloads were established based on the results of the 6 minute walk test done at initial orientation visit.  The plan for exercise progression was also introduced and progression will be customized based on patient's performance and goals.                Exercise Goals and Review:   Exercise Goals     Row Name 10/18/23 1525             Exercise Goals   Increase Physical Activity Yes       Intervention Provide advice, education, support and counseling about physical activity/exercise needs.;Develop an individualized exercise prescription for aerobic and resistive training based on initial evaluation findings, risk stratification, comorbidities and participant's personal goals.       Expected Outcomes Short Term: Attend rehab on a regular basis to increase amount of physical activity.;Long Term: Add in home exercise to make exercise part of routine and to increase amount of physical activity.;Long Term: Exercising regularly at least 3-5 days a week.       Increase Strength and Stamina Yes       Intervention Provide advice, education, support and counseling about physical activity/exercise needs.;Develop an individualized exercise prescription for aerobic and resistive training based on initial evaluation findings, risk stratification, comorbidities and participant's personal goals.       Expected Outcomes Short Term: Increase workloads from initial exercise prescription for resistance, speed, and METs.;Short Term: Perform resistance training exercises routinely during rehab and add in resistance training at home;Long Term: Improve cardiorespiratory fitness, muscular endurance and strength as measured by increased METs and functional capacity ( )       Able to understand and use rate of perceived exertion (RPE) scale Yes       Intervention Provide education and explanation on how to use RPE scale       Expected Outcomes  Short Term: Able to use RPE daily in rehab to express subjective intensity level;Long Term:  Able to use RPE to guide intensity level when exercising independently       Able to understand and use Dyspnea scale Yes       Intervention Provide education and explanation on how to use Dyspnea scale       Expected Outcomes Short Term: Able to use Dyspnea scale daily in  rehab to express subjective sense of shortness of breath during exertion;Long Term: Able to use Dyspnea scale to guide intensity level when exercising independently       Knowledge and understanding of Target Heart Rate Range (THRR) Yes       Intervention Provide education and explanation of THRR including how the numbers were predicted and where they are located for reference       Expected Outcomes Short Term: Able to state/look up THRR;Short Term: Able to use daily as guideline for intensity in rehab;Long Term: Able to use THRR to govern intensity when exercising independently       Able to check pulse independently Yes       Intervention Provide education and demonstration on how to check pulse in carotid and radial arteries.;Review the importance of being able to check your own pulse for safety during independent exercise       Expected Outcomes Short Term: Able to explain why pulse checking is important during independent exercise;Long Term: Able to check pulse independently and accurately       Understanding of Exercise Prescription Yes       Intervention Provide education, explanation, and written materials on patient's individual exercise prescription       Expected Outcomes Short Term: Able to explain program exercise prescription;Long Term: Able to explain home exercise prescription to exercise independently                Exercise Goals Re-Evaluation :  Exercise Goals Re-Evaluation     Row Name 10/23/23 1551 11/22/23 1621 11/23/23 1611 12/04/23 1651       Exercise Goal Re-Evaluation   Exercise Goals Review Increase  Physical Activity;Able to understand and use rate of perceived exertion (RPE) scale;Knowledge and understanding of Target Heart Rate Range (THRR);Understanding of Exercise Prescription;Increase Strength and Stamina;Able to check pulse independently Increase Physical Activity;Able to understand and use Dyspnea scale;Understanding of Exercise Prescription;Increase Strength and Stamina;Knowledge and understanding of Target Heart Rate Range (THRR);Able to understand and use rate of perceived exertion (RPE) scale;Able to check pulse independently Increase Physical Activity;Increase Strength and Stamina;Understanding of Exercise Prescription Increase Physical Activity    Comments Reviewed RPE and dyspnea scale, THR and program prescription with pt today.  Pt voiced understanding and was given a copy of goals to take home. Reviewed home exercise with pt today.  Pt plans to use the treadmill, elliptical, stretch, and might add stationary bike, jogging outside (after trying in rehab), and adding dumbbells/resistance training for at least 3 additional days at home for exercise.  Reviewed THR, pulse, RPE, sign and symptoms, pulse oximetery and when to call 911 or MD.  Also discussed weather considerations and indoor options.  Pt voiced understanding. Danice Goltz is off to a good start in rehab. He recently increased his treadmill workload to a speed of 3.5 mph and an incline of 2.5%. He also improved to level 5 on the T4 and T6 nustep,  and level 3 on the elliptical. We will continue to monitor his progress in the program. THeo is walking and exercising at home/sessions for 5-6 days a week.    Expected Outcomes Short: Use RPE daily to regulate intensity.  Long: Follow program prescription in THR. Short: Add 3 additional days at home for exercise. Long: Continue to exercise at home independently. Short: Continue to follow current exercise prescription. Long: Continue exercise to improve strength and stamina. STG continue to exercise  5-6 days a week, keeping exercise progression.  LTG strength and stamina  continues to improve             Discharge Exercise Prescription (Final Exercise Prescription Changes):  Exercise Prescription Changes - 11/23/23 1600       Response to Exercise   Blood Pressure (Admit) 140/72    Blood Pressure (Exercise) 188/80    Blood Pressure (Exit) 100/60    Heart Rate (Admit) 108 bpm    Heart Rate (Exercise) 140 bpm    Heart Rate (Exit) 87 bpm    Rating of Perceived Exertion (Exercise) 14    Symptoms none    Comments First two weeks of rehab    Duration Continue with 30 min of aerobic exercise without signs/symptoms of physical distress.    Intensity THRR unchanged      Progression   Progression Continue to progress workloads to maintain intensity without signs/symptoms of physical distress.    Average METs 4.44      Resistance Training   Training Prescription Yes    Weight 5 lb    Reps 10-15      Interval Training   Interval Training No      Treadmill   MPH 3.5    Grade 2.5    Minutes 15    METs 4.89      NuStep   Level 5    Minutes 15      Elliptical   Level 3    Speed 3.7    Minutes 15    METs 3.1      REL-XR   Level 8    Minutes 15      T5 Nustep   Level 5   T6 nustep   Minutes 15      Home Exercise Plan   Plans to continue exercise at Home (comment)   Treadmill, elliptical, stretching, might add bike and jogging outside, adding dumbbells/resistance training   Frequency Add 3 additional days to program exercise sessions.    Initial Home Exercises Provided 11/22/23      Oxygen   Maintain Oxygen Saturation 88% or higher             Nutrition:  Target Goals: Understanding of nutrition guidelines, daily intake of sodium 1500mg , cholesterol 200mg , calories 30% from fat and 7% or less from saturated fats, daily to have 5 or more servings of fruits and vegetables.  Education: All About Nutrition: -Group instruction provided by verbal, written  material, interactive activities, discussions, models, and posters to present general guidelines for heart healthy nutrition including fat, fiber, MyPlate, the role of sodium in heart healthy nutrition, utilization of the nutrition label, and utilization of this knowledge for meal planning. Follow up email sent as well. Written material given at graduation.   Biometrics:  Pre Biometrics - 10/18/23 1526       Pre Biometrics   Height 5' 6.9" (1.699 m)    Weight 170 lb (77.1 kg)    Waist Circumference 36 inches    Hip Circumference 35.5 inches    Waist to Hip Ratio 1.01 %    BMI (Calculated) 26.71    Single Leg Stand 30 seconds              Nutrition Therapy Plan and Nutrition Goals:  Nutrition Therapy & Goals - 12/04/23 1656       Personal Nutrition Goals   Personal Goal #2 Eat 15-30gProtein and 30-60gCarbs at each meal.    Personal Goal #3 Read labels and reduce sodium intake to below 2300mg . Ideally 1500mg  per day.  Personal Goal #4 switch to brown rice             Nutrition Assessments:  MEDIFICTS Score Key: >=70 Need to make dietary changes  40-70 Heart Healthy Diet <= 40 Therapeutic Level Cholesterol Diet  Flowsheet Row Cardiac Rehab from 10/18/2023 in [facility] Cardiac and Pulmonary Rehab  Picture Your Plate Total Score on Admission 67      Picture Your Plate Scores: <54 Unhealthy dietary pattern with much room for improvement. 41-50 Dietary pattern unlikely to meet recommendations for good health and room for improvement. 51-60 More healthful dietary pattern, with some room for improvement.  >60 Healthy dietary pattern, although there may be some specific behaviors that could be improved.    Nutrition Goals Re-Evaluation:  Nutrition Goals Re-Evaluation     Row Name 12/04/23 1657             Goals   Comment Danice Goltz has switched to brown rice, he has tried 2 brands.  "it's not sticky".  He is still eating it versus the white rice. He is adding more carb  to his meal plan. stated with  steamed potatoes. He is not drinking sodas, only water. He has been reading food labels and working on decreaseing sodium intake       Expected Outcome STG continue to work on suggested changes, asking RD for help as needed.  LTG has developed a meal plan with the RD advice that works for his family and THeo.         Personal Goal #2 Re-Evaluation   Personal Goal #2 Eat 15-30gProtein and 30-60gCarbs at each meal.         Personal Goal #3 Re-Evaluation   Personal Goal #3 Read labels and reduce sodium intake to below 2300mg . Ideally 1500mg  per day.         Personal Goal #4 Re-Evaluation   Personal Goal #4 switch to brown rice                Nutrition Goals Discharge (Final Nutrition Goals Re-Evaluation):  Nutrition Goals Re-Evaluation - 12/04/23 1657       Goals   Comment Theo has switched to brown rice, he has tried 2 brands.  "it's not sticky".  He is still eating it versus the white rice. He is adding more carb to his meal plan. stated with  steamed potatoes. He is not drinking sodas, only water. He has been reading food labels and working on decreaseing sodium intake    Expected Outcome STG continue to work on suggested changes, asking RD for help as needed.  LTG has developed a meal plan with the RD advice that works for his family and THeo.      Personal Goal #2 Re-Evaluation   Personal Goal #2 Eat 15-30gProtein and 30-60gCarbs at each meal.      Personal Goal #3 Re-Evaluation   Personal Goal #3 Read labels and reduce sodium intake to below 2300mg . Ideally 1500mg  per day.      Personal Goal #4 Re-Evaluation   Personal Goal #4 switch to brown rice             Psychosocial: Target Goals: Acknowledge presence or absence of significant depression and/or stress, maximize coping skills, provide positive support system. Participant is able to verbalize types and ability to use techniques and skills needed for reducing stress and depression.    Education: Stress, Anxiety, and Depression - Group verbal and visual presentation to define topics covered.  Reviews how body  is impacted by stress, anxiety, and depression.  Also discusses healthy ways to reduce stress and to treat/manage anxiety and depression.  Written material given at graduation.   Education: Sleep Hygiene -Provides group verbal and written instruction about how sleep can affect your health.  Define sleep hygiene, discuss sleep cycles and impact of sleep habits. Review good sleep hygiene tips.    Initial Review & Psychosocial Screening:  Initial Psych Review & Screening - 10/11/23 1428       Initial Review   Current issues with None Identified      Family Dynamics   Good Support System? Yes   wife daughter and son     Barriers   Psychosocial barriers to participate in program There are no identifiable barriers or psychosocial needs.      Screening Interventions   Interventions To provide support and resources with identified psychosocial needs;Provide feedback about the scores to participant    Expected Outcomes Short Term goal: Utilizing psychosocial counselor, staff and physician to assist with identification of specific Stressors or current issues interfering with healing process. Setting desired goal for each stressor or current issue identified.;Long Term Goal: Stressors or current issues are controlled or eliminated.;Short Term goal: Identification and review with participant of any Quality of Life or Depression concerns found by scoring the questionnaire.;Long Term goal: The participant improves quality of Life and PHQ9 Scores as seen by post scores and/or verbalization of changes             Quality of Life Scores:   Quality of Life - 10/18/23 1531       Quality of Life   Select Quality of Life      Quality of Life Scores   Health/Function Pre 26 %    Socioeconomic Pre 24.75 %    Psych/Spiritual Pre 28.29 %    Family Pre 30 %    GLOBAL Pre  26.74 %            Scores of 19 and below usually indicate a poorer quality of life in these areas.  A difference of  2-3 points is a clinically meaningful difference.  A difference of 2-3 points in the total score of the Quality of Life Index has been associated with significant improvement in overall quality of life, self-image, physical symptoms, and general health in studies assessing change in quality of life.  PHQ-9: Review Flowsheet       10/18/2023 08/25/2023  Depression screen PHQ 2/9  Decreased Interest 0 0  Down, Depressed, Hopeless 0 0  PHQ - 2 Score 0 0  Altered sleeping 0 0  Tired, decreased energy 2 1  Change in appetite 0 0  Feeling bad or failure about yourself  0 0  Trouble concentrating 1 0  Moving slowly or fidgety/restless 0 0  Suicidal thoughts 0 0  PHQ-9 Score 3 1  Difficult doing work/chores Somewhat difficult -   Interpretation of Total Score  Total Score Depression Severity:  1-4 = Minimal depression, 5-9 = Mild depression, 10-14 = Moderate depression, 15-19 = Moderately severe depression, 20-27 = Severe depression   Psychosocial Evaluation and Intervention:  Psychosocial Evaluation - 10/11/23 1441       Psychosocial Evaluation & Interventions   Interventions Encouraged to exercise with the program and follow exercise prescription    Comments ther are no barriers to attending the program.  He lives with his wife,daughter and son.  THey are his support team. He is ready to start the  program amd meet with the RD to work on a  heart healthy weight loss plan.    Expected Outcomes STG attends all scheduled sessions, works on exericse progression and nutrition goals LTG able to continue with exercise progression and nutrition goals after discharge    Continue Psychosocial Services  Follow up required by staff             Psychosocial Re-Evaluation:  Psychosocial Re-Evaluation     Row Name 12/04/23 1654             Psychosocial Re-Evaluation    Current issues with None Identified       Comments THeo has no concerns or barriers. He is making lifestyle changes and realizing that he is feeling better and is very pleased with his latest lab results       Expected Outcomes STG continue to attend scheduled sessions and follow the healthy guidelines he is learning in the program.  LTG  continue with healthy lifestyle       Interventions Encouraged to attend Cardiac Rehabilitation for the exercise       Continue Psychosocial Services  Follow up required by staff                Psychosocial Discharge (Final Psychosocial Re-Evaluation):  Psychosocial Re-Evaluation - 12/04/23 1654       Psychosocial Re-Evaluation   Current issues with None Identified    Comments THeo has no concerns or barriers. He is making lifestyle changes and realizing that he is feeling better and is very pleased with his latest lab results    Expected Outcomes STG continue to attend scheduled sessions and follow the healthy guidelines he is learning in the program.  LTG  continue with healthy lifestyle    Interventions Encouraged to attend Cardiac Rehabilitation for the exercise    Continue Psychosocial Services  Follow up required by staff             Vocational Rehabilitation: Provide vocational rehab assistance to qualifying candidates.   Vocational Rehab Evaluation & Intervention:  Vocational Rehab - 10/11/23 1431       Initial Vocational Rehab Evaluation & Intervention   Assessment shows need for Vocational Rehabilitation No             Education: Education Goals: Education classes will be provided on a variety of topics geared toward better understanding of heart health and risk factor modification. Participant will state understanding/return demonstration of topics presented as noted by education test scores.  Learning Barriers/Preferences:  Learning Barriers/Preferences - 10/11/23 1429       Learning Barriers/Preferences   Learning  Barriers None    Learning Preferences None             General Cardiac Education Topics:  AED/CPR: - Group verbal and written instruction with the use of models to demonstrate the basic use of the AED with the basic ABC's of resuscitation.   Anatomy and Cardiac Procedures: - Group verbal and visual presentation and models provide information about basic cardiac anatomy and function. Reviews the testing methods done to diagnose heart disease and the outcomes of the test results. Describes the treatment choices: Medical Management, Angioplasty, or Coronary Bypass Surgery for treating various heart conditions including Myocardial Infarction, Angina, Valve Disease, and Cardiac Arrhythmias.  Written material given at graduation. Flowsheet Row Cardiac Rehab from 11/08/2023 in [facility] Cardiac and Pulmonary Rehab  Date 10/25/23  Educator SB  Instruction Review Code 1- Verbalizes Understanding  Medication Safety: - Group verbal and visual instruction to review commonly prescribed medications for heart and lung disease. Reviews the medication, class of the drug, and side effects. Includes the steps to properly store meds and maintain the prescription regimen.  Written material given at graduation.   Intimacy: - Group verbal instruction through game format to discuss how heart and lung disease can affect sexual intimacy. Written material given at graduation..   Know Your Numbers and Heart Failure: - Group verbal and visual instruction to discuss disease risk factors for cardiac and pulmonary disease and treatment options.  Reviews associated critical values for Overweight/Obesity, Hypertension, Cholesterol, and Diabetes.  Discusses basics of heart failure: signs/symptoms and treatments.  Introduces Heart Failure Zone chart for action plan for heart failure.  Written material given at graduation. Flowsheet Row Cardiac Rehab from 11/08/2023 in [facility] Cardiac and Pulmonary Rehab  Date 11/08/23   Educator SB  Instruction Review Code 1- Verbalizes Understanding       Infection Prevention: - Provides verbal and written material to individual with discussion of infection control including proper hand washing and proper equipment cleaning during exercise session. Flowsheet Row Cardiac Rehab from 11/08/2023 in [facility] Cardiac and Pulmonary Rehab  Date 10/18/23  Educator MB  Instruction Review Code 1- Verbalizes Understanding       Falls Prevention: - Provides verbal and written material to individual with discussion of falls prevention and safety. Flowsheet Row Cardiac Rehab from 11/08/2023 in [facility] Cardiac and Pulmonary Rehab  Date 10/18/23  Educator MB  Instruction Review Code 1- Verbalizes Understanding       Other: -Provides group and verbal instruction on various topics (see comments)   Knowledge Questionnaire Score:   Core Components/Risk Factors/Patient Goals at Admission:  Personal Goals and Risk Factors at Admission - 10/18/23 1533       Core Components/Risk Factors/Patient Goals on Admission    Weight Management Yes;Weight Loss    Intervention Weight Management: Develop a combined nutrition and exercise program designed to reach desired caloric intake, while maintaining appropriate intake of nutrient and fiber, sodium and fats, and appropriate energy expenditure required for the weight goal.;Weight Management: Provide education and appropriate resources to help participant work on and attain dietary goals.;Weight Management/Obesity: Establish reasonable short term and long term weight goals.    Admit Weight 170 lb (77.1 kg)    Goal Weight: Short Term 160 lb (72.6 kg)    Goal Weight: Long Term 150 lb (68 kg)    Expected Outcomes Short Term: Continue to assess and modify interventions until short term weight is achieved;Long Term: Adherence to nutrition and physical activity/exercise program aimed toward attainment of established weight goal;Weight Loss:  Understanding of general recommendations for a balanced deficit meal plan, which promotes 1-2 lb weight loss per week and includes a negative energy balance of [phone] kcal/d;Understanding recommendations for meals to include 15-35% energy as protein, 25-35% energy from fat, 35-60% energy from carbohydrates, less than 200mg  of dietary cholesterol, 20-35 gm of total fiber daily;Understanding of distribution of calorie intake throughout the day with the consumption of 4-5 meals/snacks    Diabetes Yes    Intervention Provide education about signs/symptoms and action to take for hypo/hyperglycemia.;Provide education about proper nutrition, including hydration, and aerobic/resistive exercise prescription along with prescribed medications to achieve blood glucose in normal ranges: Fasting glucose 65-99 mg/dL    Expected Outcomes Short Term: Participant verbalizes understanding of the signs/symptoms and immediate care of hyper/hypoglycemia, proper foot care and importance of medication, aerobic/resistive exercise  and nutrition plan for blood glucose control.;Long Term: Attainment of HbA1C < 7%.    Hypertension Yes    Intervention Provide education on lifestyle modifcations including regular physical activity/exercise, weight management, moderate sodium restriction and increased consumption of fresh fruit, vegetables, and low fat dairy, alcohol moderation, and smoking cessation.;Monitor prescription use compliance.    Expected Outcomes Short Term: Continued assessment and intervention until BP is < 140/4mm HG in hypertensive participants. < 130/22mm HG in hypertensive participants with diabetes, heart failure or chronic kidney disease.;Long Term: Maintenance of blood pressure at goal levels.    Lipids Yes    Intervention Provide education and support for participant on nutrition & aerobic/resistive exercise along with prescribed medications to achieve LDL 70mg , HDL >40mg .    Expected Outcomes Short Term:  Participant states understanding of desired cholesterol values and is compliant with medications prescribed. Participant is following exercise prescription and nutrition guidelines.;Long Term: Cholesterol controlled with medications as prescribed, with individualized exercise RX and with personalized nutrition plan. Value goals: LDL < 70mg , HDL > 40 mg.             Education:Diabetes - Individual verbal and written instruction to review signs/symptoms of diabetes, desired ranges of glucose level fasting, after meals and with exercise. Acknowledge that pre and post exercise glucose checks will be done for 3 sessions at entry of program.   Core Components/Risk Factors/Patient Goals Review:   Goals and Risk Factor Review     Row Name 12/04/23 1712             Core Components/Risk Factors/Patient Goals Review   Personal Goals Review Weight Management/Obesity;Lipids;Hypertension;Diabetes       Review THeo has lost 2 pounds in the past month down to 167 lbs. HIs BP is doing well 108-140/72-74 arrival and 100-108/66-72 after session. He had recent blood raw and his LD reduced from 110 to 73, HDL is 55. Trig lowered from 215 to 78 and his HGA1C lowered from 6.8 to 5.3%.  He is chaging his nutriton to include brown rice, no sode, more carbs with his meals and he is monitoring sodium intake. All woking towards his goals of risk factor control and weight loss.       Expected Outcomes STG continue to follow the RD guidelines for nutriton changes, be compliant with meds and continue to 5-6 days of exericse all for risk fctor control and weight loss.  LTG maintain the lifestyle changes after discharge                Core Components/Risk Factors/Patient Goals at Discharge (Final Review):   Goals and Risk Factor Review - 12/04/23 1712       Core Components/Risk Factors/Patient Goals Review   Personal Goals Review Weight Management/Obesity;Lipids;Hypertension;Diabetes    Review THeo has lost 2  pounds in the past month down to 167 lbs. HIs BP is doing well 108-140/72-74 arrival and 100-108/66-72 after session. He had recent blood raw and his LD reduced from 110 to 73, HDL is 55. Trig lowered from 215 to 78 and his HGA1C lowered from 6.8 to 5.3%.  He is chaging his nutriton to include brown rice, no sode, more carbs with his meals and he is monitoring sodium intake. All woking towards his goals of risk factor control and weight loss.    Expected Outcomes STG continue to follow the RD guidelines for nutriton changes, be compliant with meds and continue to 5-6 days of exericse all for risk fctor control and weight loss.  LTG maintain the lifestyle changes after discharge             ITP Comments:  ITP Comments     Row Name 10/11/23 1450 10/18/23 1517 10/23/23 1550 11/08/23 0847 12/06/23 0859   ITP Comments Virtual orientation call completed today. he has an appointment on Date: 10/18/2023  for EP eval and gym Orientation.  Documentation of diagnosis can be found in [facility] Date: 09/09/2023 . Completed and gym orientation. Initial ITP created and sent for review to Dr. Bethann Punches, Medical Director. First full day of exercise!  Patient was oriented to gym and equipment including functions, settings, policies, and procedures.  Patient's individual exercise prescription and treatment plan were reviewed.  All starting workloads were established based on the results of the 6 minute walk test done at initial orientation visit.  The plan for exercise progression was also introduced and progression will be customized based on patient's performance and goals. 30 Day review completed. Medical Director ITP review done, changes made as directed, and signed approval by Medical Director. New Patient 30 Day review completed. Medical Director ITP review done, changes made as directed, and signed approval by Medical Director.            Comments: 30 Day review completed. Medical Director ITP review done,  changes made as directed, and signed approval by Medical Director.

## 2023-12-06 NOTE — Progress Notes (Signed)
 Daily Session Note  Patient Details  Name: Brent Oneill MRN: [id number] Date of Birth: 30-Nov-1970 Referring Provider:   Flowsheet Row Cardiac Rehab from 10/18/2023 in [facility] Cardiac and Pulmonary Rehab  Referring Provider Dr. Windell Norfolk       Encounter Date: 12/06/2023  Check In:  Session Check In - 12/06/23 1540       Check-In   Supervising physician immediately available to respond to emergencies See telemetry face sheet for immediately available ER MD    Location [facility]-Cardiac & Pulmonary Rehab    Staff Present Susann Givens RN,BSN;Joseph [facility] Madilyn Fireman BS, ACSM CEP, Exercise Physiologist;Susanne Bice, RN, BSN, CCRP    Virtual Visit No    Medication changes reported     No    Fall or balance concerns reported    No    Warm-up and Cool-down Performed on first and last piece of equipment    Resistance Training Performed Yes    VAD Patient? No    PAD/SET Patient? No      Pain Assessment   Currently in Pain? No/denies                Social History   Tobacco Use  Smoking Status Never  Smokeless Tobacco Never    Goals Met:  Independence with exercise equipment Exercise tolerated well No report of concerns or symptoms today Strength training completed today  Goals Unmet:  Not Applicable  Comments: Pt able to follow exercise prescription today without complaint.  Will continue to monitor for progression.    Dr. Bethann Punches is Medical Director for [facility] Cardiac Rehabilitation.  Dr. Vida Rigger is Medical Director for [facility] Pulmonary Rehabilitation.

## 2023-12-07 ENCOUNTER — Encounter: Payer: 59 | Admitting: *Deleted

## 2023-12-07 DIAGNOSIS — I213 ST elevation (STEMI) myocardial infarction of unspecified site: Secondary | ICD-10-CM | POA: Diagnosis not present

## 2023-12-07 NOTE — Progress Notes (Signed)
 Daily Session Note  Patient Details  Name: Brent Oneill MRN: [id number] Date of Birth: October 22, 1970 Referring Provider:   Flowsheet Row Cardiac Rehab from 10/18/2023 in [facility] Cardiac and Pulmonary Rehab  Referring Provider Dr. Windell Norfolk       Encounter Date: 12/07/2023  Check In:  Session Check In - 12/07/23 1552       Check-In   Supervising physician immediately available to respond to emergencies See telemetry face sheet for immediately available ER MD    Location [facility]-Cardiac & Pulmonary Rehab    Staff Present Susann Givens RN,BSN;Joseph [facility] BS, Exercise Physiologist;Susanne Bice, RN, BSN, CCRP    Virtual Visit No    Medication changes reported     No    Fall or balance concerns reported    No    Warm-up and Cool-down Performed on first and last piece of equipment    Resistance Training Performed Yes    VAD Patient? No    PAD/SET Patient? No      Pain Assessment   Currently in Pain? No/denies                Social History   Tobacco Use  Smoking Status Never  Smokeless Tobacco Never    Goals Met:  Independence with exercise equipment Exercise tolerated well No report of concerns or symptoms today Strength training completed today  Goals Unmet:  Not Applicable  Comments: Pt able to follow exercise prescription today without complaint.  Will continue to monitor for progression.    Dr. Bethann Punches is Medical Director for [facility] Cardiac Rehabilitation.  Dr. Vida Rigger is Medical Director for [facility] Pulmonary Rehabilitation.

## 2023-12-11 ENCOUNTER — Encounter: Payer: 59 | Admitting: *Deleted

## 2023-12-11 DIAGNOSIS — I213 ST elevation (STEMI) myocardial infarction of unspecified site: Secondary | ICD-10-CM

## 2023-12-11 NOTE — Progress Notes (Signed)
 Daily Session Note  Patient Details  Name: Brent Oneill MRN: [id number] Date of Birth: [birth year] Referring Provider:   Flowsheet Row Cardiac Rehab from 10/18/2023 in [facility] Cardiac and Pulmonary Rehab  Referring Provider Dr. Windell Norfolk       Encounter Date: 12/11/2023  Check In:  Session Check In - 12/11/23 1547       Check-In   Supervising physician immediately available to respond to emergencies See telemetry face sheet for immediately available ER MD    Location [facility]-Cardiac & Pulmonary Rehab    Staff Present Elige Ko RCP,RRT,BSRT;Cadence Minton Jewel Baize RN,BSN;Laureen Manson Passey, BS, RRT, CPFT    Virtual Visit No    Medication changes reported     No    Fall or balance concerns reported    No    Warm-up and Cool-down Performed on first and last piece of equipment    Resistance Training Performed Yes    VAD Patient? No    PAD/SET Patient? No      Pain Assessment   Currently in Pain? No/denies                Social History   Tobacco Use  Smoking Status Never  Smokeless Tobacco Never    Goals Met:  Independence with exercise equipment Exercise tolerated well No report of concerns or symptoms today Strength training completed today  Goals Unmet:  Not Applicable  Comments: Pt able to follow exercise prescription today without complaint.  Will continue to monitor for progression.    Dr. Bethann Punches is Medical Director for [facility] Cardiac Rehabilitation.  Dr. Vida Rigger is Medical Director for [facility] Pulmonary Rehabilitation.

## 2023-12-13 ENCOUNTER — Encounter (INDEPENDENT_AMBULATORY_CARE_PROVIDER_SITE_OTHER): Payer: Self-pay

## 2023-12-13 ENCOUNTER — Telehealth (INDEPENDENT_AMBULATORY_CARE_PROVIDER_SITE_OTHER): Payer: Self-pay | Admitting: Otolaryngology

## 2023-12-13 ENCOUNTER — Encounter: Payer: 59 | Admitting: *Deleted

## 2023-12-13 DIAGNOSIS — I213 ST elevation (STEMI) myocardial infarction of unspecified site: Secondary | ICD-10-CM

## 2023-12-13 NOTE — Telephone Encounter (Signed)
 LVM & sent MyChart message to r/s appt - Dr. Suszanne Conners not in office. [phone] afm

## 2023-12-13 NOTE — Progress Notes (Signed)
 Daily Session Note  Patient Details  Name: Brent Oneill MRN: [id number] Date of Birth: 01/06/1971 Referring Provider:   Flowsheet Row Cardiac Rehab from 10/18/2023 in [facility] Cardiac and Pulmonary Rehab  Referring Provider Dr. Windell Norfolk       Encounter Date: 12/13/2023  Check In:  Session Check In - 12/13/23 1549       Check-In   Supervising physician immediately available to respond to emergencies See telemetry face sheet for immediately available ER MD    Location [facility]-Cardiac & Pulmonary Rehab    Staff Present Susann Givens RN,BSN;Joseph Holl[facility] Kinnier;Cora Collum, RN, BSN, [employer], ACSM CEP, Exercise Physiologist    Virtual Visit No    Medication changes reported     No    Fall or balance concerns reported    No    Warm-up and Cool-down Performed on first and last piece of equipment    Resistance Training Performed Yes    VAD Patient? No    PAD/SET Patient? No      Pain Assessment   Currently in Pain? No/denies                Social History   Tobacco Use  Smoking Status Never  Smokeless Tobacco Never    Goals Met:  Independence with exercise equipment Exercise tolerated well No report of concerns or symptoms today Strength training completed today  Goals Unmet:  Not Applicable  Comments: Pt able to follow exercise prescription today without complaint.  Will continue to monitor for progression.    Dr. Bethann Punches is Medical Director for [facility] Cardiac Rehabilitation.  Dr. Vida Rigger is Medical Director for [facility] Pulmonary Rehabilitation.

## 2023-12-17 ENCOUNTER — Other Ambulatory Visit: Payer: Self-pay | Admitting: Urology

## 2023-12-17 DIAGNOSIS — N401 Enlarged prostate with lower urinary tract symptoms: Secondary | ICD-10-CM

## 2023-12-18 ENCOUNTER — Encounter: Payer: 59 | Admitting: *Deleted

## 2023-12-18 DIAGNOSIS — I213 ST elevation (STEMI) myocardial infarction of unspecified site: Secondary | ICD-10-CM | POA: Diagnosis not present

## 2023-12-18 NOTE — Progress Notes (Signed)
 Daily Session Note  Patient Details  Name: Brent Oneill MRN: [id number] Date of Birth: 09/22/1971 Referring Provider:   Flowsheet Row Cardiac Rehab from 10/18/2023 in [facility] Cardiac and Pulmonary Rehab  Referring Provider Dr. Windell Norfolk       Encounter Date: 12/18/2023  Check In:  Session Check In - 12/18/23 1548       Check-In   Supervising physician immediately available to respond to emergencies See telemetry face sheet for immediately available ER MD    Location [facility]-Cardiac & Pulmonary Rehab    Staff Present Susann Givens RN,BSN;Joseph [facility] BS, Exercise Physiologist    Virtual Visit No    Medication changes reported     No    Fall or balance concerns reported    No    Warm-up and Cool-down Performed on first and last piece of equipment    Resistance Training Performed Yes    VAD Patient? No    PAD/SET Patient? No      Pain Assessment   Currently in Pain? No/denies                Social History   Tobacco Use  Smoking Status Never  Smokeless Tobacco Never    Goals Met:  Independence with exercise equipment Exercise tolerated well No report of concerns or symptoms today Strength training completed today  Goals Unmet:  Not Applicable  Comments: Pt able to follow exercise prescription today without complaint.  Will continue to monitor for progression.    Dr. Bethann Punches is Medical Director for [facility] Cardiac Rehabilitation.  Dr. Vida Rigger is Medical Director for [facility] Pulmonary Rehabilitation.

## 2023-12-20 ENCOUNTER — Telehealth (INDEPENDENT_AMBULATORY_CARE_PROVIDER_SITE_OTHER): Payer: Self-pay | Admitting: Otolaryngology

## 2023-12-20 ENCOUNTER — Encounter: Payer: 59 | Admitting: *Deleted

## 2023-12-20 DIAGNOSIS — I213 ST elevation (STEMI) myocardial infarction of unspecified site: Secondary | ICD-10-CM | POA: Diagnosis not present

## 2023-12-20 NOTE — Telephone Encounter (Signed)
 Left vm to reschedule appt with Dr.Teoh on 01/12/2024 due to him being out of the office.

## 2023-12-20 NOTE — Progress Notes (Signed)
 Daily Session Note  Patient Details  Name: Brent Oneill MRN: [id number] Date of Birth: [birth year] Referring Provider:   Flowsheet Row Cardiac Rehab from 10/18/2023 in [facility] Cardiac and Pulmonary Rehab  Referring Provider Dr. Windell Norfolk       Encounter Date: 12/20/2023  Check In:  Session Check In - 12/20/23 1531       Check-In   Supervising physician immediately available to respond to emergencies See telemetry face sheet for immediately available ER MD    Location [facility]-Cardiac & Pulmonary Rehab    Staff Present Susann Givens RN,BSN;Joseph [facility] Madilyn Fireman BS, ACSM CEP, Exercise Physiologist;Jason Wallace Cullens RDN,LDN    Virtual Visit No    Medication changes reported     No    Fall or balance concerns reported    No    Warm-up and Cool-down Performed on first and last piece of equipment    Resistance Training Performed Yes    VAD Patient? No    PAD/SET Patient? No      Pain Assessment   Currently in Pain? No/denies                Social History   Tobacco Use  Smoking Status Never  Smokeless Tobacco Never    Goals Met:  Independence with exercise equipment Exercise tolerated well No report of concerns or symptoms today Strength training completed today  Goals Unmet:  Not Applicable  Comments: Pt able to follow exercise prescription today without complaint.  Will continue to monitor for progression.    Dr. Bethann Punches is Medical Director for [facility] Cardiac Rehabilitation.  Dr. Vida Rigger is Medical Director for [facility] Pulmonary Rehabilitation.

## 2023-12-21 ENCOUNTER — Encounter: Payer: 59 | Admitting: *Deleted

## 2023-12-21 DIAGNOSIS — I213 ST elevation (STEMI) myocardial infarction of unspecified site: Secondary | ICD-10-CM

## 2023-12-21 NOTE — Progress Notes (Signed)
 Daily Session Note  Patient Details  Name: Brent Oneill MRN: [id number] Date of Birth: August 18, 1971 Referring Provider:   Flowsheet Row Cardiac Rehab from 10/18/2023 in [facility] Cardiac and Pulmonary Rehab  Referring Provider Dr. Windell Norfolk       Encounter Date: 12/21/2023  Check In:  Session Check In - 12/21/23 1541       Check-In   Supervising physician immediately available to respond to emergencies See telemetry face sheet for immediately available ER MD    Location [facility]-Cardiac & Pulmonary Rehab    Staff Present Susann Givens RN,BSN;Joseph [facility] BS, Exercise Physiologist;Noah Tickle, BS, Exercise Physiologist    Virtual Visit No    Medication changes reported     No    Fall or balance concerns reported    No    Warm-up and Cool-down Performed on first and last piece of equipment    Resistance Training Performed Yes    VAD Patient? No    PAD/SET Patient? No      Pain Assessment   Currently in Pain? No/denies                Social History   Tobacco Use  Smoking Status Never  Smokeless Tobacco Never    Goals Met:  Independence with exercise equipment Exercise tolerated well No report of concerns or symptoms today Strength training completed today  Goals Unmet:  Not Applicable  Comments: Pt able to follow exercise prescription today without complaint.  Will continue to monitor for progression.    Dr. Bethann Punches is Medical Director for [facility] Cardiac Rehabilitation.  Dr. Vida Rigger is Medical Director for [facility] Pulmonary Rehabilitation.

## 2023-12-25 ENCOUNTER — Encounter: Payer: Self-pay | Admitting: Urology

## 2023-12-25 ENCOUNTER — Encounter: Admitting: *Deleted

## 2023-12-25 ENCOUNTER — Ambulatory Visit: Payer: 59 | Admitting: Urology

## 2023-12-25 VITALS — BP 126/81 | HR 89 | Ht 66.0 in | Wt 168.0 lb

## 2023-12-25 DIAGNOSIS — R972 Elevated prostate specific antigen [PSA]: Secondary | ICD-10-CM

## 2023-12-25 DIAGNOSIS — I213 ST elevation (STEMI) myocardial infarction of unspecified site: Secondary | ICD-10-CM | POA: Diagnosis not present

## 2023-12-25 DIAGNOSIS — N138 Other obstructive and reflux uropathy: Secondary | ICD-10-CM | POA: Diagnosis not present

## 2023-12-25 DIAGNOSIS — N401 Enlarged prostate with lower urinary tract symptoms: Secondary | ICD-10-CM

## 2023-12-25 LAB — BLADDER SCAN AMB NON-IMAGING: PVR: 0 WU

## 2023-12-25 MED ORDER — TAMSULOSIN HCL 0.4 MG PO CAPS: 0.4000 mg | ORAL_CAPSULE | Freq: Every day | ORAL | 4 refills | Status: AC

## 2023-12-25 MED ORDER — FINASTERIDE 5 MG PO TABS: 5.0000 mg | ORAL_TABLET | Freq: Every day | ORAL | 3 refills | Status: AC

## 2023-12-25 NOTE — Progress Notes (Signed)
 Daily Session Note  Patient Details  Name: Brent Oneill MRN: [id number] Date of Birth: [birth year] Referring Provider:   Flowsheet Row Cardiac Rehab from 10/18/2023 in [facility] Cardiac and Pulmonary Rehab  Referring Provider Dr. Windell Norfolk       Encounter Date: 12/25/2023  Check In:  Session Check In - 12/25/23 1714       Check-In   Supervising physician immediately available to respond to emergencies See telemetry face sheet for immediately available ER MD    Location [facility]-Cardiac & Pulmonary Rehab    Staff Present Susann Givens RN,BSN;Joseph [facility] Madilyn Fireman BS, ACSM CEP, Exercise Physiologist    Virtual Visit No    Medication changes reported     No    Fall or balance concerns reported    No    Warm-up and Cool-down Performed on first and last piece of equipment    Resistance Training Performed Yes    VAD Patient? No    PAD/SET Patient? No      Pain Assessment   Currently in Pain? No/denies                Social History   Tobacco Use  Smoking Status Never  Smokeless Tobacco Never    Goals Met:  Independence with exercise equipment Exercise tolerated well No report of concerns or symptoms today Strength training completed today  Goals Unmet:  Not Applicable  Comments: Pt able to follow exercise prescription today without complaint.  Will continue to monitor for progression.    Dr. Bethann Punches is Medical Director for [facility] Cardiac Rehabilitation.  Dr. Vida Rigger is Medical Director for [facility] Pulmonary Rehabilitation.

## 2023-12-25 NOTE — Progress Notes (Signed)
   12/25/2023 10:56 AM   Meryle Ready Silverio Lay 1970-10-10 [id number]  Reason for visit: Follow up PSA screening, BPH and urinary symptoms, premature ejaculation  HPI: 53 year old male with a history of elevated PSA that has decreased on recheck, and very reassuring percentage free with 25% free in the past.  He takes Flomax daily, and notices significant worsening in his urinary symptoms if he misses a dose.  He remains very resistant to considering any surgical options.  Denies any major change in urination on the Flomax, he occasionally takes a double dose or twice daily.  PVR today normal at 0ml.  We discussed options including addition of finasteride or considering outlet procedures like HOLEP.  He remains resistant to considering surgery at this time but was interested in adding finasteride.  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  I personally viewed and interpreted the CT scan from December 2024 showing an 81 g prostate, no urologic abnormalities, no hydronephrosis.  Most recent PSA 3.9, and PSA density very reassuring at 0.05.  Risk and benefits of PSA screening discussed again, reassurance provided regarding CT and low PSA density, anticipate PSA will decrease on finasteride.  Denies any changes in the premature ejaculation, remains uninterested in medication options, behavioral strategies reviewed again.  Continue Flomax for BPH, okay to take twice daily or increase dose to 0.8 mg nightly if needed Finasteride 5 mg daily added for BPH RTC 1 year PVR, symptom check, sooner if problems   Sondra Come, MD  [facility] [address] [place], [place] [address] [phone]

## 2023-12-27 ENCOUNTER — Encounter: Attending: Internal Medicine | Admitting: *Deleted

## 2023-12-27 DIAGNOSIS — Z48812 Encounter for surgical aftercare following surgery on the circulatory system: Secondary | ICD-10-CM | POA: Insufficient documentation

## 2023-12-27 DIAGNOSIS — I213 ST elevation (STEMI) myocardial infarction of unspecified site: Secondary | ICD-10-CM | POA: Insufficient documentation

## 2023-12-27 DIAGNOSIS — I252 Old myocardial infarction: Secondary | ICD-10-CM | POA: Insufficient documentation

## 2023-12-27 NOTE — Progress Notes (Signed)
 Daily Session Note  Patient Details  Name: Brent Oneill MRN: [id number] Date of Birth: [birth year] Referring Provider:   Flowsheet Row Cardiac Rehab from 10/18/2023 in [facility] Cardiac and Pulmonary Rehab  Referring Provider Dr. Windell Norfolk       Encounter Date: 12/27/2023  Check In:  Session Check In - 12/27/23 1727       Check-In   Supervising physician immediately available to respond to emergencies See telemetry face sheet for immediately available ER MD    Location [facility]-Cardiac & Pulmonary Rehab    Staff Present Susann Givens RN,BSN;Joseph [facility] Madilyn Fireman BS, ACSM CEP, Exercise Physiologist    Virtual Visit No    Medication changes reported     No    Fall or balance concerns reported    No    Warm-up and Cool-down Performed on first and last piece of equipment    Resistance Training Performed Yes    VAD Patient? No    PAD/SET Patient? No      Pain Assessment   Currently in Pain? No/denies                Social History   Tobacco Use  Smoking Status Never  Smokeless Tobacco Never    Goals Met:  Independence with exercise equipment Exercise tolerated well No report of concerns or symptoms today Strength training completed today  Goals Unmet:  Not Applicable  Comments: Pt able to follow exercise prescription today without complaint.  Will continue to monitor for progression.    Dr. Bethann Punches is Medical Director for [facility] Cardiac Rehabilitation.  Dr. Vida Rigger is Medical Director for [facility] Pulmonary Rehabilitation.

## 2023-12-28 ENCOUNTER — Encounter

## 2024-01-01 ENCOUNTER — Encounter

## 2024-01-03 ENCOUNTER — Encounter: Payer: Self-pay | Admitting: *Deleted

## 2024-01-03 ENCOUNTER — Encounter: Admitting: *Deleted

## 2024-01-03 DIAGNOSIS — Z48812 Encounter for surgical aftercare following surgery on the circulatory system: Secondary | ICD-10-CM | POA: Diagnosis not present

## 2024-01-03 DIAGNOSIS — I213 ST elevation (STEMI) myocardial infarction of unspecified site: Secondary | ICD-10-CM

## 2024-01-03 NOTE — Progress Notes (Signed)
 Daily Session Note  Patient Details  Name: Ernest Popowski MRN: [id number] Date of Birth: January 06, 1971 Referring Provider:   Flowsheet Row Cardiac Rehab from 10/18/2023 in [facility] Cardiac and Pulmonary Rehab  Referring Provider Dr. Windell Norfolk       Encounter Date: 01/03/2024  Check In:  Session Check In - 01/03/24 1731       Check-In   Supervising physician immediately available to respond to emergencies See telemetry face sheet for immediately available ER MD    Location [facility]-Cardiac & Pulmonary Rehab    Staff Present Susann Givens RN,BSN;Joseph [facility]ier;Cora Collum, RN, BSN, CCRP    Virtual Visit No    Medication changes reported     No    Fall or balance concerns reported    No    Warm-up and Cool-down Performed on first and last piece of equipment    Resistance Training Performed Yes    VAD Patient? No    PAD/SET Patient? No      Pain Assessment   Currently in Pain? No/denies                Social History   Tobacco Use  Smoking Status Never  Smokeless Tobacco Never    Goals Met:  Independence with exercise equipment Exercise tolerated well No report of concerns or symptoms today Strength training completed today  Goals Unmet:  Not Applicable  Comments: Pt able to follow exercise prescription today without complaint.  Will continue to monitor for progression.    Dr. Bethann Punches is Medical Director for [facility] Cardiac Rehabilitation.  Dr. Vida Rigger is Medical Director for [facility] Pulmonary Rehabilitation.

## 2024-01-03 NOTE — Progress Notes (Signed)
 Cardiac Individual Treatment Plan  Patient Details  Name: Brent Oneill MRN: [id number] Date of Birth: 09/01/1971 Referring Provider:   Flowsheet Row Cardiac Rehab from 10/18/2023 in [facility] Cardiac and Pulmonary Rehab  Referring Provider Dr. Windell Norfolk       Initial Encounter Date:  Flowsheet Row Cardiac Rehab from 10/18/2023 in [facility] Cardiac and Pulmonary Rehab  Date 10/18/23       Visit Diagnosis: ST elevation myocardial infarction (STEMI), unspecified artery (HCC)  Patient's Home Medications on Admission:  Current Outpatient Medications:    acetaminophen (TYLENOL) 325 MG tablet, Take 2 tablets (650 mg total) by mouth every 6 (six) hours as needed for mild pain (pain score 1-3) (or Fever >/= 101)., Disp: 30 tablet, Rfl: 0   albuterol (VENTOLIN HFA) 108 (90 Base) MCG/ACT inhaler, Inhale 2 puffs into the lungs every 6 (six) hours as needed for wheezing or shortness of breath., Disp: , Rfl:    aspirin 81 MG chewable tablet, Chew 1 tablet (81 mg total) by mouth daily., Disp: 30 tablet, Rfl: 11   clopidogrel (PLAVIX) 75 MG tablet, Take 1 tablet (75 mg total) by mouth daily with breakfast., Disp: 90 tablet, Rfl: 11   finasteride (PROSCAR) 5 MG tablet, Take 1 tablet (5 mg total) by mouth daily., Disp: 90 tablet, Rfl: 3   fluticasone (FLONASE) 50 MCG/ACT nasal spray, SPRAY 2 SPRAYS INTO EACH NOSTRIL EVERY DAY, Disp: 48 mL, Rfl: 3   fluticasone furoate-vilanterol (BREO ELLIPTA) 200-25 MCG/ACT AEPB, Inhale 1 puff into the lungs daily., Disp: 30 each, Rfl: 2   metFORMIN (GLUCOPHAGE) 850 MG tablet, Take 1 tablet (850 mg total) by mouth 2 (two) times daily with a meal., Disp: 180 tablet, Rfl: 0   metoprolol tartrate (LOPRESSOR) 50 MG tablet, Take 1 tablet (50 mg total) by mouth 2 (two) times daily., Disp: 60 tablet, Rfl: 12   rosuvastatin (CRESTOR) 40 MG tablet, Take 1 tablet (40 mg total) by mouth daily., Disp: 30 tablet, Rfl: 11   tamsulosin (FLOMAX) 0.4 MG CAPS capsule, Take 1 capsule (0.4 mg  total) by mouth daily., Disp: 90 capsule, Rfl: 4   Vitamin D, Ergocalciferol, (DRISDOL) 1.25 MG (50000 UNIT) CAPS capsule, Take 50,000 Units by mouth every 7 (seven) days., Disp: , Rfl:   Past Medical History: Past Medical History:  Diagnosis Date   Allergic rhinitis 03/14/2016   Asthma, persistent 03/14/2016    Tobacco Use: Social History   Tobacco Use  Smoking Status Never  Smokeless Tobacco Never    Labs: Review Flowsheet       Latest Ref Rng & Units 04/21/2023 08/11/2023 09/09/2023 11/30/2023  Labs for ITP Cardiac and Pulmonary Rehab  Cholestrol 100 - 199 mg/dL 782  956  213  086   LDL (calc) 0 - 99 mg/dL 578  469  629  43   HDL-C >39 mg/dL 45  55  48  53   Trlycerides 0 - 149 mg/dL 528  413  244  78   Hemoglobin A1c 4.8 - 5.6 % - 6.4  6.8  5.3      Exercise Target Goals: Exercise Program Goal: Individual exercise prescription set using results from initial 6 min walk test and THRR while considering  patient's activity barriers and safety.   Exercise Prescription Goal: Initial exercise prescription builds to 30-45 minutes a day of aerobic activity, 2-3 days per week.  Home exercise guidelines will be given to patient during program as part of exercise prescription that the participant will acknowledge.  Education: Aerobic Exercise: - Group verbal and visual presentation on the components of exercise prescription. Introduces F.I.T.T principle from ACSM for exercise prescriptions.  Reviews F.I.T.T. principles of aerobic exercise including progression. Written material given at graduation.   Education: Resistance Exercise: - Group verbal and visual presentation on the components of exercise prescription. Introduces F.I.T.T principle from ACSM for exercise prescriptions  Reviews F.I.T.T. principles of resistance exercise including progression. Written material given at graduation.    Education: Exercise & Equipment Safety: - Individual verbal instruction and demonstration  of equipment use and safety with use of the equipment. Flowsheet Row Cardiac Rehab from 11/08/2023 in [facility] Cardiac and Pulmonary Rehab  Date 10/18/23  Educator MB  Instruction Review Code 1- Verbalizes Understanding       Education: Exercise Physiology & General Exercise Guidelines: - Group verbal and written instruction with models to review the exercise physiology of the cardiovascular system and associated critical values. Provides general exercise guidelines with specific guidelines to those with heart or lung disease.    Education: Flexibility, Balance, Mind/Body Relaxation: - Group verbal and visual presentation with interactive activity on the components of exercise prescription. Introduces F.I.T.T principle from ACSM for exercise prescriptions. Reviews F.I.T.T. principles of flexibility and balance exercise training including progression. Also discusses the mind body connection.  Reviews various relaxation techniques to help reduce and manage stress (i.e. Deep breathing, progressive muscle relaxation, and visualization). Balance handout provided to take home. Written material given at graduation.   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 HR 105 bpm      Max Ex. BP 114/70     2 Minute Post BP 108/76              Oxygen Initial Assessment:   Oxygen Re-Evaluation:   Oxygen Discharge (Final Oxygen Re-Evaluation):   Initial Exercise Prescription:  Initial Exercise Prescription - 10/18/23 1500       Date of Initial Exercise RX and Referring Provider   Date 10/18/23    Referring Provider Dr. Windell Norfolk      Oxygen   Maintain Oxygen Saturation 88% or higher      Treadmill   MPH 2.8    Grade 0.5    Minutes 15    METs 3.34      NuStep   Level 4    SPM 80    Minutes  15    METs 4.12      Elliptical   Level 1    Speed 3    Minutes 15    METs 4.12      REL-XR   Level 3    Speed 50    Minutes 15    METs 4.12      T5 Nustep   Level 4   T6   SPM 80    Minutes 15    METs 4.12      Prescription Details   Frequency (times per week) 3    Duration Progress to 30 minutes of continuous aerobic without signs/symptoms of physical distress      Intensity   THRR 40-80% of Max Heartrate 118-151    Ratings of Perceived Exertion 11-13    Perceived Dyspnea 0-4      Progression   Progression Continue to progress workloads to maintain intensity without signs/symptoms of physical distress.      Resistance Training   Training Prescription Yes    Weight 5 lb    Reps 10-15             Perform Capillary Blood Glucose checks as needed.  Exercise Prescription Changes:   Exercise Prescription Changes     Row Name 10/18/23 1500 11/22/23 1600 11/23/23 1600 12/07/23 1600 12/21/23 1500     Response to Exercise   Blood Pressure (Admit) 108/74 -- 140/72 100/64 106/60   Blood Pressure (Exercise) 114/70 -- 188/80 162/78 --   Blood Pressure (Exit) 108/76 -- 100/60 100/62 102/62   Heart Rate (Admit) 91 bpm -- 108 bpm 76 bpm 88 bpm   Heart Rate (Exercise) 105 bpm -- 140 bpm 140 bpm 157 bpm   Heart Rate (Exit) 85 bpm -- 87 bpm 101 bpm 93 bpm   Oxygen Saturation (Admit) 96 % -- -- -- --   Oxygen Saturation (Exercise)  95 % -- -- -- --   Oxygen Saturation (Exit) 98 % -- -- -- --   Rating of Perceived Exertion (Exercise) 11 -- 14 15 15    Perceived Dyspnea (Exercise) 0 -- -- -- --   Symptoms none -- none none none   Comments results -- First two weeks of rehab -- --   Duration Progress to 30 minutes of  aerobic without signs/symptoms of physical distress -- Continue with 30 min of aerobic exercise without signs/symptoms of physical distress. Continue with 30 min of aerobic exercise without signs/symptoms of physical distress. Continue with 30 min of aerobic exercise without signs/symptoms of physical distress.   Intensity THRR New -- THRR unchanged THRR unchanged THRR unchanged     Progression   Progression Continue to progress workloads to maintain intensity without signs/symptoms of physical distress. -- Continue to progress workloads to maintain intensity without signs/symptoms of physical distress. Continue to progress workloads to maintain intensity without signs/symptoms of physical distress. Continue to progress workloads to maintain intensity without signs/symptoms of physical distress.   Average METs 4.12 -- 4.44 5.66 5.17     Resistance Training   Training Prescription -- -- Yes Yes Yes   Weight -- -- 5 lb 5 lb 5 lb   Reps -- -- 10-15 10-15 10-15     Interval Training   Interval Training -- -- No No No     Treadmill   MPH -- -- 3.5 4 3.5   Grade -- -- 2.5 1.5 2.5   Minutes -- -- 15 15 15    METs -- -- 4.89  4.89 4.89     NuStep   Level -- -- 5 7 5    Minutes -- -- 15 15 15    METs -- -- -- 7.5 5.1     Elliptical   Level -- -- 3 -- 6   Speed -- -- 3.7 -- 3.3   Minutes -- -- 15 -- 15   METs -- -- 3.1 -- 4.3     REL-XR   Level -- -- 8 8 --   Minutes -- -- 15 15 --   METs -- -- -- 8 --     T5 Nustep   Level -- -- 5  T6 nustep 5  T6 nustep --   Minutes -- -- 15 15 --   METs -- -- -- 3.8 --     Home Exercise Plan   Plans to continue exercise at -- Home (comment)  Treadmill,  elliptical, stretching, might add bike and jogging outside, adding dumbbells/resistance training Home (comment)  Treadmill, elliptical, stretching, might add bike and jogging outside, adding dumbbells/resistance training -- Home (comment)  Treadmill, elliptical, stretching, might add bike and jogging outside, adding dumbbells/resistance training   Frequency -- Add 3 additional days to program exercise sessions. Add 3 additional days to program exercise sessions. -- Add 3 additional days to program exercise sessions.   Initial Home Exercises Provided -- 11/22/23 11/22/23 -- 11/22/23     Oxygen   Maintain Oxygen Saturation -- -- 88% or higher -- 88% or higher            Exercise Comments:   Exercise Comments     Row Name 10/23/23 1550           Exercise Comments First full day of exercise!  Patient was oriented to gym and equipment including functions, settings, policies, and procedures.  Patient's individual exercise prescription and treatment plan were reviewed.  All starting workloads were established based on the results of the 6 minute walk test done at initial orientation visit.  The plan for exercise progression was also introduced and progression will be customized based on patient's performance and goals.                Exercise Goals and Review:   Exercise Goals     Row Name 10/18/23 1525             Exercise Goals   Increase Physical Activity Yes       Intervention Provide advice, education, support and counseling about physical activity/exercise needs.;Develop an individualized exercise prescription for aerobic and resistive training based on initial evaluation findings, risk stratification, comorbidities and participant's personal goals.       Expected Outcomes Short Term: Attend rehab on a regular basis to increase amount of physical activity.;Long Term: Add in home exercise to make exercise part of routine and to increase amount of physical activity.;Long Term:  Exercising regularly at least 3-5 days a week.       Increase Strength and Stamina Yes       Intervention Provide advice, education, support and counseling about physical activity/exercise needs.;Develop an individualized exercise prescription for aerobic and resistive training based on initial evaluation findings, risk stratification, comorbidities and participant's personal goals.       Expected Outcomes Short Term: Increase workloads from initial exercise prescription for resistance, speed, and METs.;Short Term: Perform resistance training exercises routinely during rehab and add in resistance training at home;Long Term: Improve cardiorespiratory fitness, muscular endurance and strength as measured by increased METs and functional  capacity ( )       Able to understand and use rate of perceived exertion (RPE) scale Yes       Intervention Provide education and explanation on how to use RPE scale       Expected Outcomes Short Term: Able to use RPE daily in rehab to express subjective intensity level;Long Term:  Able to use RPE to guide intensity level when exercising independently       Able to understand and use Dyspnea scale Yes       Intervention Provide education and explanation on how to use Dyspnea scale       Expected Outcomes Short Term: Able to use Dyspnea scale daily in rehab to express subjective sense of shortness of breath during exertion;Long Term: Able to use Dyspnea scale to guide intensity level when exercising independently       Knowledge and understanding of Target Heart Rate Range (THRR) Yes       Intervention Provide education and explanation of THRR including how the numbers were predicted and where they are located for reference       Expected Outcomes Short Term: Able to state/look up THRR;Short Term: Able to use daily as guideline for intensity in rehab;Long Term: Able to use THRR to govern intensity when exercising independently       Able to check pulse independently Yes        Intervention Provide education and demonstration on how to check pulse in carotid and radial arteries.;Review the importance of being able to check your own pulse for safety during independent exercise       Expected Outcomes Short Term: Able to explain why pulse checking is important during independent exercise;Long Term: Able to check pulse independently and accurately       Understanding of Exercise Prescription Yes       Intervention Provide education, explanation, and written materials on patient's individual exercise prescription       Expected Outcomes Short Term: Able to explain program exercise prescription;Long Term: Able to explain home exercise prescription to exercise independently                Exercise Goals Re-Evaluation :  Exercise Goals Re-Evaluation     Row Name 10/23/23 1551 11/22/23 1621 11/23/23 1611 12/04/23 1651 12/07/23 1622     Exercise Goal Re-Evaluation   Exercise Goals Review Increase Physical Activity;Able to understand and use rate of perceived exertion (RPE) scale;Knowledge and understanding of Target Heart Rate Range (THRR);Understanding of Exercise Prescription;Increase Strength and Stamina;Able to check pulse independently Increase Physical Activity;Able to understand and use Dyspnea scale;Understanding of Exercise Prescription;Increase Strength and Stamina;Knowledge and understanding of Target Heart Rate Range (THRR);Able to understand and use rate of perceived exertion (RPE) scale;Able to check pulse independently Increase Physical Activity;Increase Strength and Stamina;Understanding of Exercise Prescription Increase Physical Activity Increase Physical Activity;Increase Strength and Stamina;Understanding of Exercise Prescription   Comments Reviewed RPE and dyspnea scale, THR and program prescription with pt today.  Pt voiced understanding and was given a copy of goals to take home. Reviewed home exercise with pt today.  Pt plans to use the treadmill,  elliptical, stretch, and might add stationary bike, jogging outside (after trying in rehab), and adding dumbbells/resistance training for at least 3 additional days at home for exercise.  Reviewed THR, pulse, RPE, sign and symptoms, pulse oximetery and when to call 911 or MD.  Also discussed weather considerations and indoor options.  Pt voiced understanding. Brent Oneill is off to a  good start in rehab. He recently increased his treadmill workload to a speed of 3.5 mph and an incline of 2.5%. He also improved to level 5 on the T4 and T6 nustep,  and level 3 on the elliptical. We will continue to monitor his progress in the program. Brent Oneill is walking and exercising at home/sessions for 5-6 days a week. Brent Oneill is doing well in rehab. He increased his treadmill speed to 4 mph while maintaining an incline of 1.5%. He also improved to level 7 on the T4 nustep and continued at level 8 o the XR and level 5 on the T6 nustep. We will continue to monitor his progress in the program.   Expected Outcomes Short: Use RPE daily to regulate intensity.  Long: Follow program prescription in THR. Short: Add 3 additional days at home for exercise. Long: Continue to exercise at home independently. Short: Continue to follow current exercise prescription. Long: Continue exercise to improve strength and stamina. STG continue to exercise 5-6 days a week, keeping exercise progression.  LTG strength and stamina continues to improve Short: Continue to progressively increase treadmill workload. Long: Continue exercise to improve strength and stamina.    Row Name 12/20/23 1605 12/21/23 1534           Exercise Goal Re-Evaluation   Exercise Goals Review Increase Physical Activity;Increase Strength and Stamina;Understanding of Exercise Prescription Increase Physical Activity;Increase Strength and Stamina;Understanding of Exercise Prescription      Comments Brent Oneill is doing well in rehab, he is using his exercise bike, eliptical and treadmill at home on  days he is not at rehab. Commended him and reminded him that as he graduates to look to continue exercise at home. Brent Oneill continues to do well in rehab. He recently improved to level 6 on the elliptical with a speed of 3.3 mph. He also continues to walk the treadmill at a speed of 3.5 mph with an incline of 2.5%. He has done well with 5 lb hand weights for resistance training as well. We will continue to monitor his progress in the program.      Expected Outcomes STG: Continue to increase workload LTG: Continue to exercise to improve strength and stamina Short: Increase to 6 lb hand weights for resistance training. Long: Continue exercise to improve strength and stamina.               Discharge Exercise Prescription (Final Exercise Prescription Changes):  Exercise Prescription Changes - 12/21/23 1500       Response to Exercise   Blood Pressure (Admit) 106/60    Blood Pressure (Exit) 102/62    Heart Rate (Admit) 88 bpm    Heart Rate (Exercise) 157 bpm    Heart Rate (Exit) 93 bpm    Rating of Perceived Exertion (Exercise) 15    Symptoms none    Duration Continue with 30 min of aerobic exercise without signs/symptoms of physical distress.    Intensity THRR unchanged      Progression   Progression Continue to progress workloads to maintain intensity without signs/symptoms of physical distress.    Average METs 5.17      Resistance Training   Training Prescription Yes    Weight 5 lb    Reps 10-15      Interval Training   Interval Training No      Treadmill   MPH 3.5    Grade 2.5    Minutes 15    METs 4.89      NuStep  Level 5    Minutes 15    METs 5.1      Elliptical   Level 6    Speed 3.3    Minutes 15    METs 4.3      Home Exercise Plan   Plans to continue exercise at Home (comment)   Treadmill, elliptical, stretching, might add bike and jogging outside, adding dumbbells/resistance training   Frequency Add 3 additional days to program exercise sessions.    Initial  Home Exercises Provided 11/22/23      Oxygen   Maintain Oxygen Saturation 88% or higher             Nutrition:  Target Goals: Understanding of nutrition guidelines, daily intake of sodium 1500mg , cholesterol 200mg , calories 30% from fat and 7% or less from saturated fats, daily to have 5 or more servings of fruits and vegetables.  Education: All About Nutrition: -Group instruction provided by verbal, written material, interactive activities, discussions, models, and posters to present general guidelines for heart healthy nutrition including fat, fiber, MyPlate, the role of sodium in heart healthy nutrition, utilization of the nutrition label, and utilization of this knowledge for meal planning. Follow up email sent as well. Written material given at graduation.   Biometrics:  Pre Biometrics - 10/18/23 1526       Pre Biometrics   Height 5' 6.9" (1.699 m)    Weight 170 lb (77.1 kg)    Waist Circumference 36 inches    Hip Circumference 35.5 inches    Waist to Hip Ratio 1.01 %    BMI (Calculated) 26.71    Single Leg Stand 30 seconds              Nutrition Therapy Plan and Nutrition Goals:  Nutrition Therapy & Goals - 12/04/23 1656       Personal Nutrition Goals   Personal Goal #2 Eat 15-30gProtein and 30-60gCarbs at each meal.    Personal Goal #3 Read labels and reduce sodium intake to below 2300mg . Ideally 1500mg  per day.    Personal Goal #4 switch to brown rice             Nutrition Assessments:  MEDIFICTS Score Key: >=70 Need to make dietary changes  40-70 Heart Healthy Diet <= 40 Therapeutic Level Cholesterol Diet  Flowsheet Row Cardiac Rehab from 10/18/2023 in [facility] Cardiac and Pulmonary Rehab  Picture Your Plate Total Score on Admission 67      Picture Your Plate Scores: <69 Unhealthy dietary pattern with much room for improvement. 41-50 Dietary pattern unlikely to meet recommendations for good health and room for improvement. 51-60 More healthful  dietary pattern, with some room for improvement.  >60 Healthy dietary pattern, although there may be some specific behaviors that could be improved.    Nutrition Goals Re-Evaluation:  Nutrition Goals Re-Evaluation     Row Name 12/04/23 1657 12/20/23 1621           Goals   Comment Brent Oneill has switched to brown rice, he has tried 2 brands.  "it's not sticky".  He is still eating it versus the white rice. He is adding more carb to his meal plan. stated with  steamed potatoes. He is not drinking sodas, only water. He has been reading food labels and working on decreaseing sodium intake Brent Oneill reports he is doing better with making healthy food choices. Spoke about including more carbs at meals since he reports his energy being low later in the day. Currently he is not  eating many carbs and could use some at each meal. He likes fruit so set goal to include more fruit at meals that dont have carbs.      Expected Outcome STG continue to work on suggested changes, asking RD for help as needed.  LTG has developed a meal plan with the RD advice that works for his family and Brent Oneill. STG: Include more fruits at meals and snacks. LTG: Meet nutrtion needs and follow heart healthy lifestyle        Personal Goal #2 Re-Evaluation   Personal Goal #2 Eat 15-30gProtein and 30-60gCarbs at each meal. --        Personal Goal #3 Re-Evaluation   Personal Goal #3 Read labels and reduce sodium intake to below 2300mg . Ideally 1500mg  per day. --        Personal Goal #4 Re-Evaluation   Personal Goal #4 switch to brown rice --               Nutrition Goals Discharge (Final Nutrition Goals Re-Evaluation):  Nutrition Goals Re-Evaluation - 12/20/23 1621       Goals   Comment Brent Oneill reports he is doing better with making healthy food choices. Spoke about including more carbs at meals since he reports his energy being low later in the day. Currently he is not eating many carbs and could use some at each meal. He likes fruit  so set goal to include more fruit at meals that dont have carbs.    Expected Outcome STG: Include more fruits at meals and snacks. LTG: Meet nutrtion needs and follow heart healthy lifestyle             Psychosocial: Target Goals: Acknowledge presence or absence of significant depression and/or stress, maximize coping skills, provide positive support system. Participant is able to verbalize types and ability to use techniques and skills needed for reducing stress and depression.   Education: Stress, Anxiety, and Depression - Group verbal and visual presentation to define topics covered.  Reviews how body is impacted by stress, anxiety, and depression.  Also discusses healthy ways to reduce stress and to treat/manage anxiety and depression.  Written material given at graduation.   Education: Sleep Hygiene -Provides group verbal and written instruction about how sleep can affect your health.  Define sleep hygiene, discuss sleep cycles and impact of sleep habits. Review good sleep hygiene tips.    Initial Review & Psychosocial Screening:  Initial Psych Review & Screening - 10/11/23 1428       Initial Review   Current issues with None Identified      Family Dynamics   Good Support System? Yes   wife daughter and son     Barriers   Psychosocial barriers to participate in program There are no identifiable barriers or psychosocial needs.      Screening Interventions   Interventions To provide support and resources with identified psychosocial needs;Provide feedback about the scores to participant    Expected Outcomes Short Term goal: Utilizing psychosocial counselor, staff and physician to assist with identification of specific Stressors or current issues interfering with healing process. Setting desired goal for each stressor or current issue identified.;Long Term Goal: Stressors or current issues are controlled or eliminated.;Short Term goal: Identification and review with participant  of any Quality of Life or Depression concerns found by scoring the questionnaire.;Long Term goal: The participant improves quality of Life and PHQ9 Scores as seen by post scores and/or verbalization of changes  Quality of Life Scores:   Quality of Life - 10/18/23 1531       Quality of Life   Select Quality of Life      Quality of Life Scores   Health/Function Pre 26 %    Socioeconomic Pre 24.75 %    Psych/Spiritual Pre 28.29 %    Family Pre 30 %    GLOBAL Pre 26.74 %            Scores of 19 and below usually indicate a poorer quality of life in these areas.  A difference of  2-3 points is a clinically meaningful difference.  A difference of 2-3 points in the total score of the Quality of Life Index has been associated with significant improvement in overall quality of life, self-image, physical symptoms, and general health in studies assessing change in quality of life.  PHQ-9: Review Flowsheet       10/18/2023 08/25/2023  Depression screen PHQ 2/9  Decreased Interest 0 0  Down, Depressed, Hopeless 0 0  PHQ - 2 Score 0 0  Altered sleeping 0 0  Tired, decreased energy 2 1  Change in appetite 0 0  Feeling bad or failure about yourself  0 0  Trouble concentrating 1 0  Moving slowly or fidgety/restless 0 0  Suicidal thoughts 0 0  PHQ-9 Score 3 1  Difficult doing work/chores Somewhat difficult -   Interpretation of Total Score  Total Score Depression Severity:  1-4 = Minimal depression, 5-9 = Mild depression, 10-14 = Moderate depression, 15-19 = Moderately severe depression, 20-27 = Severe depression   Psychosocial Evaluation and Intervention:  Psychosocial Evaluation - 10/11/23 1441       Psychosocial Evaluation & Interventions   Interventions Encouraged to exercise with the program and follow exercise prescription    Comments ther are no barriers to attending the program.  He lives with his wife,daughter and son.  THey are his support team. He is ready  to start the program amd meet with the RD to work on a  heart healthy weight loss plan.    Expected Outcomes STG attends all scheduled sessions, works on exericse progression and nutrition goals LTG able to continue with exercise progression and nutrition goals after discharge    Continue Psychosocial Services  Follow up required by staff             Psychosocial Re-Evaluation:  Psychosocial Re-Evaluation     Row Name 12/04/23 1654 12/20/23 1613           Psychosocial Re-Evaluation   Current issues with None Identified Current Sleep Concerns      Comments Brent Oneill has no concerns or barriers. He is making lifestyle changes and realizing that he is feeling better and is very pleased with his latest lab results Brent Oneill reports no stress or anxiety. But he does struggle with sleep and has for years. He has been working with his Dr to find solutions.      Expected Outcomes STG continue to attend scheduled sessions and follow the healthy guidelines he is learning in the program.  LTG  continue with healthy lifestyle STG: Continue to communicate with Dr about medication and sleep habits. LTG: Continue with healthy lifestyle      Interventions Encouraged to attend Cardiac Rehabilitation for the exercise Encouraged to attend Cardiac Rehabilitation for the exercise      Continue Psychosocial Services  Follow up required by staff Follow up required by staff  Psychosocial Discharge (Final Psychosocial Re-Evaluation):  Psychosocial Re-Evaluation - 12/20/23 1613       Psychosocial Re-Evaluation   Current issues with Current Sleep Concerns    Comments Brent Oneill reports no stress or anxiety. But he does struggle with sleep and has for years. He has been working with his Dr to find solutions.    Expected Outcomes STG: Continue to communicate with Dr about medication and sleep habits. LTG: Continue with healthy lifestyle    Interventions Encouraged to attend Cardiac Rehabilitation for the  exercise    Continue Psychosocial Services  Follow up required by staff             Vocational Rehabilitation: Provide vocational rehab assistance to qualifying candidates.   Vocational Rehab Evaluation & Intervention:  Vocational Rehab - 10/11/23 1431       Initial Vocational Rehab Evaluation & Intervention   Assessment shows need for Vocational Rehabilitation No             Education: Education Goals: Education classes will be provided on a variety of topics geared toward better understanding of heart health and risk factor modification. Participant will state understanding/return demonstration of topics presented as noted by education test scores.  Learning Barriers/Preferences:  Learning Barriers/Preferences - 10/11/23 1429       Learning Barriers/Preferences   Learning Barriers None    Learning Preferences None             General Cardiac Education Topics:  AED/CPR: - Group verbal and written instruction with the use of models to demonstrate the basic use of the AED with the basic ABC's of resuscitation.   Anatomy and Cardiac Procedures: - Group verbal and visual presentation and models provide information about basic cardiac anatomy and function. Reviews the testing methods done to diagnose heart disease and the outcomes of the test results. Describes the treatment choices: Medical Management, Angioplasty, or Coronary Bypass Surgery for treating various heart conditions including Myocardial Infarction, Angina, Valve Disease, and Cardiac Arrhythmias.  Written material given at graduation. Flowsheet Row Cardiac Rehab from 11/08/2023 in [facility] Cardiac and Pulmonary Rehab  Date 10/25/23  Educator SB  Instruction Review Code 1- Verbalizes Understanding       Medication Safety: - Group verbal and visual instruction to review commonly prescribed medications for heart and lung disease. Reviews the medication, class of the drug, and side effects. Includes the  steps to properly store meds and maintain the prescription regimen.  Written material given at graduation.   Intimacy: - Group verbal instruction through game format to discuss how heart and lung disease can affect sexual intimacy. Written material given at graduation..   Know Your Numbers and Heart Failure: - Group verbal and visual instruction to discuss disease risk factors for cardiac and pulmonary disease and treatment options.  Reviews associated critical values for Overweight/Obesity, Hypertension, Cholesterol, and Diabetes.  Discusses basics of heart failure: signs/symptoms and treatments.  Introduces Heart Failure Zone chart for action plan for heart failure.  Written material given at graduation. Flowsheet Row Cardiac Rehab from 11/08/2023 in [facility] Cardiac and Pulmonary Rehab  Date 11/08/23  Educator SB  Instruction Review Code 1- Verbalizes Understanding       Infection Prevention: - Provides verbal and written material to individual with discussion of infection control including proper hand washing and proper equipment cleaning during exercise session. Flowsheet Row Cardiac Rehab from 11/08/2023 in [facility] Cardiac and Pulmonary Rehab  Date 10/18/23  Educator MB  Instruction Review Code 1- Verbalizes Understanding  Falls Prevention: - Provides verbal and written material to individual with discussion of falls prevention and safety. Flowsheet Row Cardiac Rehab from 11/08/2023 in [facility] Cardiac and Pulmonary Rehab  Date 10/18/23  Educator MB  Instruction Review Code 1- Verbalizes Understanding       Other: -Provides group and verbal instruction on various topics (see comments)   Knowledge Questionnaire Score:   Core Components/Risk Factors/Patient Goals at Admission:  Personal Goals and Risk Factors at Admission - 10/18/23 1533       Core Components/Risk Factors/Patient Goals on Admission    Weight Management Yes;Weight Loss    Intervention Weight Management:  Develop a combined nutrition and exercise program designed to reach desired caloric intake, while maintaining appropriate intake of nutrient and fiber, sodium and fats, and appropriate energy expenditure required for the weight goal.;Weight Management: Provide education and appropriate resources to help participant work on and attain dietary goals.;Weight Management/Obesity: Establish reasonable short term and long term weight goals.    Admit Weight 170 lb (77.1 kg)    Goal Weight: Short Term 160 lb (72.6 kg)    Goal Weight: Long Term 150 lb (68 kg)    Expected Outcomes Short Term: Continue to assess and modify interventions until short term weight is achieved;Long Term: Adherence to nutrition and physical activity/exercise program aimed toward attainment of established weight goal;Weight Loss: Understanding of general recommendations for a balanced deficit meal plan, which promotes 1-2 lb weight loss per week and includes a negative energy balance of [phone] kcal/d;Understanding recommendations for meals to include 15-35% energy as protein, 25-35% energy from fat, 35-60% energy from carbohydrates, less than 200mg  of dietary cholesterol, 20-35 gm of total fiber daily;Understanding of distribution of calorie intake throughout the day with the consumption of 4-5 meals/snacks    Diabetes Yes    Intervention Provide education about signs/symptoms and action to take for hypo/hyperglycemia.;Provide education about proper nutrition, including hydration, and aerobic/resistive exercise prescription along with prescribed medications to achieve blood glucose in normal ranges: Fasting glucose 65-99 mg/dL    Expected Outcomes Short Term: Participant verbalizes understanding of the signs/symptoms and immediate care of hyper/hypoglycemia, proper foot care and importance of medication, aerobic/resistive exercise and nutrition plan for blood glucose control.;Long Term: Attainment of HbA1C < 7%.    Hypertension Yes     Intervention Provide education on lifestyle modifcations including regular physical activity/exercise, weight management, moderate sodium restriction and increased consumption of fresh fruit, vegetables, and low fat dairy, alcohol moderation, and smoking cessation.;Monitor prescription use compliance.    Expected Outcomes Short Term: Continued assessment and intervention until BP is < 140/83mm HG in hypertensive participants. < 130/67mm HG in hypertensive participants with diabetes, heart failure or chronic kidney disease.;Long Term: Maintenance of blood pressure at goal levels.    Lipids Yes    Intervention Provide education and support for participant on nutrition & aerobic/resistive exercise along with prescribed medications to achieve LDL 70mg , HDL >40mg .    Expected Outcomes Short Term: Participant states understanding of desired cholesterol values and is compliant with medications prescribed. Participant is following exercise prescription and nutrition guidelines.;Long Term: Cholesterol controlled with medications as prescribed, with individualized exercise RX and with personalized nutrition plan. Value goals: LDL < 70mg , HDL > 40 mg.             Education:Diabetes - Individual verbal and written instruction to review signs/symptoms of diabetes, desired ranges of glucose level fasting, after meals and with exercise. Acknowledge that pre and post exercise glucose checks will be  done for 3 sessions at entry of program.   Core Components/Risk Factors/Patient Goals Review:   Goals and Risk Factor Review     Row Name 12/04/23 1712 12/20/23 1624           Core Components/Risk Factors/Patient Goals Review   Personal Goals Review Weight Management/Obesity;Lipids;Hypertension;Diabetes Weight Management/Obesity      Review Brent Oneill has lost 2 pounds in the past month down to 167 lbs. HIs BP is doing well 108-140/72-74 arrival and 100-108/66-72 after session. He had recent blood raw and his LD  reduced from 110 to 73, HDL is 55. Trig lowered from 215 to 78 and his HGA1C lowered from 6.8 to 5.3%.  He is chaging his nutriton to include brown rice, no sode, more carbs with his meals and he is monitoring sodium intake. All woking towards his goals of risk factor control and weight loss. Brent Oneill is doing well with losing weight, down to 164lbs. He is happy with his progress but wants to make sure the changes he is makin will help him keep the weight off. Spoke with him about long term success and how to work towards more sustainable habits.      Expected Outcomes STG continue to follow the RD guidelines for nutriton changes, be compliant with meds and continue to 5-6 days of exericse all for risk fctor control and weight loss.  LTG maintain the lifestyle changes after discharge STG: Lose 5% of CBW. LTG: Achieve and maintain a healthy weight               Core Components/Risk Factors/Patient Goals at Discharge (Final Review):   Goals and Risk Factor Review - 12/20/23 1624       Core Components/Risk Factors/Patient Goals Review   Personal Goals Review Weight Management/Obesity    Review Brent Oneill is doing well with losing weight, down to 164lbs. He is happy with his progress but wants to make sure the changes he is makin will help him keep the weight off. Spoke with him about long term success and how to work towards more sustainable habits.    Expected Outcomes STG: Lose 5% of CBW. LTG: Achieve and maintain a healthy weight             ITP Comments:  ITP Comments     Row Name 10/11/23 1450 10/18/23 1517 10/23/23 1550 11/08/23 0847 12/06/23 0859   ITP Comments Virtual orientation call completed today. he has an appointment on Date: 10/18/2023  for EP eval and gym Orientation.  Documentation of diagnosis can be found in [facility] Date: 09/09/2023 . Completed and gym orientation. Initial ITP created and sent for review to Dr. Bethann Punches, Medical Director. First full day of exercise!  Patient was  oriented to gym and equipment including functions, settings, policies, and procedures.  Patient's individual exercise prescription and treatment plan were reviewed.  All starting workloads were established based on the results of the 6 minute walk test done at initial orientation visit.  The plan for exercise progression was also introduced and progression will be customized based on patient's performance and goals. 30 Day review completed. Medical Director ITP review done, changes made as directed, and signed approval by Medical Director. New Patient 30 Day review completed. Medical Director ITP review done, changes made as directed, and signed approval by Medical Director.    Row Name 01/03/24 1204           ITP Comments 30 Day review completed. Medical Director ITP review done,  changes made as directed, and signed approval by Medical Director.                Comments:

## 2024-01-04 ENCOUNTER — Encounter: Admitting: *Deleted

## 2024-01-04 DIAGNOSIS — Z48812 Encounter for surgical aftercare following surgery on the circulatory system: Secondary | ICD-10-CM | POA: Diagnosis not present

## 2024-01-04 DIAGNOSIS — I213 ST elevation (STEMI) myocardial infarction of unspecified site: Secondary | ICD-10-CM | POA: Diagnosis not present

## 2024-01-04 NOTE — Progress Notes (Signed)
 Daily Session Note  Patient Details  Name: Brent Oneill MRN: [id number] Date of Birth: 1971-09-17 Referring Provider:   Flowsheet Row Cardiac Rehab from 10/18/2023 in [facility] Cardiac and Pulmonary Rehab  Referring Provider Dr. Windell Norfolk       Encounter Date: 01/04/2024  Check In:  Session Check In - 01/04/24 1717       Check-In   Supervising physician immediately available to respond to emergencies See telemetry face sheet for immediately available ER MD    Location [facility]-Cardiac & Pulmonary Rehab    Staff Present Susann Givens RN,BSN;Joseph [facility] BS, Exercise Physiologist    Virtual Visit No    Medication changes reported     No    Fall or balance concerns reported    No    Warm-up and Cool-down Performed on first and last piece of equipment    Resistance Training Performed Yes    VAD Patient? No    PAD/SET Patient? No      Pain Assessment   Currently in Pain? No/denies                Social History   Tobacco Use  Smoking Status Never  Smokeless Tobacco Never    Goals Met:  Independence with exercise equipment Exercise tolerated well No report of concerns or symptoms today Strength training completed today  Goals Unmet:  Not Applicable  Comments: Pt able to follow exercise prescription today without complaint.  Will continue to monitor for progression.    Dr. Bethann Punches is Medical Director for [facility] Cardiac Rehabilitation.  Dr. Vida Rigger is Medical Director for [facility] Pulmonary Rehabilitation.

## 2024-01-08 ENCOUNTER — Encounter: Admitting: *Deleted

## 2024-01-08 DIAGNOSIS — I213 ST elevation (STEMI) myocardial infarction of unspecified site: Secondary | ICD-10-CM | POA: Diagnosis not present

## 2024-01-08 DIAGNOSIS — Z48812 Encounter for surgical aftercare following surgery on the circulatory system: Secondary | ICD-10-CM | POA: Diagnosis not present

## 2024-01-08 NOTE — Progress Notes (Signed)
 Daily Session Note  Patient Details  Name: Brent Oneill MRN: [id number] Date of Birth: 12-21-1970 Referring Provider:   Flowsheet Row Cardiac Rehab from 10/18/2023 in [facility] Cardiac and Pulmonary Rehab  Referring Provider Dr. Joetta Mustache       Encounter Date: 01/08/2024  Check In:  Session Check In - 01/08/24 1909       Check-In   Supervising physician immediately available to respond to emergencies See telemetry face sheet for immediately available ER MD    Location [facility]-Cardiac & Pulmonary Rehab    Staff Present Theone Fitting, RN, BSN, Debroah Fanning BS, ACSM CEP, Exercise Physiologist;Susanne Bice, RN, BSN, CCRP    Virtual Visit No    Medication changes reported     No    Fall or balance concerns reported    No    Tobacco Cessation No Change    Warm-up and Cool-down Performed on first and last piece of equipment    Resistance Training Performed Yes    VAD Patient? No    PAD/SET Patient? No      Pain Assessment   Currently in Pain? No/denies                Social History   Tobacco Use  Smoking Status Never  Smokeless Tobacco Never    Goals Met:  Independence with exercise equipment Exercise tolerated well No report of concerns or symptoms today  Goals Unmet:  Not Applicable  Comments: Pt able to follow exercise prescription today without complaint.  Will continue to monitor for progression.    Dr. Firman Hughes is Medical Director for [facility] Cardiac Rehabilitation.  Dr. Fuad Aleskerov is Medical Director for [facility] Pulmonary Rehabilitation.

## 2024-01-10 ENCOUNTER — Encounter: Admitting: *Deleted

## 2024-01-10 DIAGNOSIS — I213 ST elevation (STEMI) myocardial infarction of unspecified site: Secondary | ICD-10-CM | POA: Diagnosis not present

## 2024-01-10 DIAGNOSIS — Z48812 Encounter for surgical aftercare following surgery on the circulatory system: Secondary | ICD-10-CM | POA: Diagnosis not present

## 2024-01-10 NOTE — Progress Notes (Signed)
 Daily Session Note  Patient Details  Name: Brent Oneill MRN: [id number] Date of Birth: Feb 13, 1971 Referring Provider:   Flowsheet Row Cardiac Rehab from 10/18/2023 in [facility] Cardiac and Pulmonary Rehab  Referring Provider Dr. Joetta Mustache       Encounter Date: 01/10/2024  Check In:  Session Check In - 01/10/24 1534       Check-In   Supervising physician immediately available to respond to emergencies See telemetry face sheet for immediately available ER MD    Location [facility]-Cardiac & Pulmonary Rehab    Staff Present Sue Em RN,BSN;Joseph [facility]ra Cramp BS, ACSM CEP, Exercise Physiologist;Susanne Bice, RN, BSN, CCRP    Virtual Visit No    Medication changes reported     No    Fall or balance concerns reported    No    Warm-up and Cool-down Performed on first and last piece of equipment    Resistance Training Performed Yes    VAD Patient? No      Pain Assessment   Currently in Pain? No/denies                Social History   Tobacco Use  Smoking Status Never  Smokeless Tobacco Never    Goals Met:  Independence with exercise equipment Exercise tolerated well No report of concerns or symptoms today Strength training completed today  Goals Unmet:  Not Applicable  Comments: Pt able to follow exercise prescription today without complaint.  Will continue to monitor for progression.    Dr. Firman Hughes is Medical Director for [facility] Cardiac Rehabilitation.  Dr. Fuad Aleskerov is Medical Director for [facility] Pulmonary Rehabilitation.

## 2024-01-11 ENCOUNTER — Encounter: Admitting: *Deleted

## 2024-01-11 DIAGNOSIS — Z48812 Encounter for surgical aftercare following surgery on the circulatory system: Secondary | ICD-10-CM | POA: Diagnosis not present

## 2024-01-11 DIAGNOSIS — I213 ST elevation (STEMI) myocardial infarction of unspecified site: Secondary | ICD-10-CM

## 2024-01-11 NOTE — Progress Notes (Signed)
 Daily Session Note  Patient Details  Name: Brent Oneill MRN: [id number] Date of Birth: 06-10-1971 Referring Provider:   Flowsheet Row Cardiac Rehab from 10/18/2023 in [facility] Cardiac and Pulmonary Rehab  Referring Provider Dr. Joetta Mustache       Encounter Date: 01/11/2024  Check In:  Session Check In - 01/11/24 1537       Check-In   Supervising physician immediately available to respond to emergencies See telemetry face sheet for immediately available ER MD    Location [facility]-Cardiac & Pulmonary Rehab    Staff Present Sue Em RN,BSN;Maxon Regan Cao BS, Exercise Physiologist;Noah Tickle, BS, Exercise Physiologist;Laureen Bevin Bucks, BS, RRT, CPFT    Virtual Visit No    Medication changes reported     No    Fall or balance concerns reported    No    Warm-up and Cool-down Performed on first and last piece of equipment    Resistance Training Performed Yes    VAD Patient? No    PAD/SET Patient? No      Pain Assessment   Currently in Pain? No/denies                Social History   Tobacco Use  Smoking Status Never  Smokeless Tobacco Never    Goals Met:  Independence with exercise equipment Exercise tolerated well No report of concerns or symptoms today Strength training completed today  Goals Unmet:  Not Applicable  Comments: Pt able to follow exercise prescription today without complaint.  Will continue to monitor for progression.    Dr. Firman Hughes is Medical Director for [facility] Cardiac Rehabilitation.  Dr. Fuad Aleskerov is Medical Director for [facility] Pulmonary Rehabilitation.

## 2024-01-12 ENCOUNTER — Ambulatory Visit (INDEPENDENT_AMBULATORY_CARE_PROVIDER_SITE_OTHER): Payer: BC Managed Care – PPO

## 2024-01-15 ENCOUNTER — Encounter: Admitting: *Deleted

## 2024-01-15 VITALS — Ht 66.9 in | Wt 167.9 lb

## 2024-01-15 DIAGNOSIS — I213 ST elevation (STEMI) myocardial infarction of unspecified site: Secondary | ICD-10-CM

## 2024-01-15 DIAGNOSIS — Z48812 Encounter for surgical aftercare following surgery on the circulatory system: Secondary | ICD-10-CM | POA: Diagnosis not present

## 2024-01-15 NOTE — Patient Instructions (Signed)
 Discharge Patient Instructions  Patient Details  Name: Brent Oneill MRN: [id number] Date of Birth: 05-30-1971 Referring Provider:  Shari Daughters, MD   Number of Visits: 44  Reason for Discharge:  Patient reached a stable level of exercise. Patient independent in their exercise. Patient has met program and personal goals.  Smoking History:  Social History   Tobacco Use  Smoking Status Never  Smokeless Tobacco Never    Diagnosis:  ST elevation myocardial infarction (STEMI), unspecified artery (HCC)  Initial Exercise Prescription:  Initial Exercise Prescription - 10/18/23 1500       Date of Initial Exercise RX and Referring Provider   Date 10/18/23    Referring Provider Dr. Joetta Mustache      Oxygen   Maintain Oxygen Saturation 88% or higher      Treadmill   MPH 2.8    Grade 0.5    Minutes 15    METs 3.34      NuStep   Level 4    SPM 80    Minutes 15    METs 4.12      Elliptical   Level 1    Speed 3    Minutes 15    METs 4.12      REL-XR   Level 3    Speed 50    Minutes 15    METs 4.12      T5 Nustep   Level 4   T6   SPM 80    Minutes 15    METs 4.12      Prescription Details   Frequency (times per week) 3    Duration Progress to 30 minutes of continuous aerobic without signs/symptoms of physical distress      Intensity   THRR 40-80% of Max Heartrate 118-151    Ratings of Perceived Exertion 11-13    Perceived Dyspnea 0-4      Progression   Progression Continue to progress workloads to maintain intensity without signs/symptoms of physical distress.      Resistance Training   Training Prescription Yes    Weight 5 lb    Reps 10-15             Discharge Exercise Prescription (Final Exercise Prescription Changes):  Exercise Prescription Changes - 01/05/24 0900       Response to Exercise   Blood Pressure (Admit) 102/60    Blood Pressure (Exit) 102/62    Heart Rate (Admit) 95 bpm    Heart Rate (Exercise) 150 bpm    Heart  Rate (Exit) 109 bpm    Rating of Perceived Exertion (Exercise) 15    Symptoms none    Duration Continue with 30 min of aerobic exercise without signs/symptoms of physical distress.    Intensity THRR unchanged      Progression   Progression Continue to progress workloads to maintain intensity without signs/symptoms of physical distress.    Average METs 5.84      Resistance Training   Training Prescription Yes    Weight 7 lb    Reps 10-15      Interval Training   Interval Training No      Treadmill   MPH 3.5    Grade 3    Minutes 15    METs 5.13      Elliptical   Level 10    Speed 3.3    Minutes 15    METs 6.2      REL-XR   Level 10    Minutes  15    METs 8.2      Home Exercise Plan   Plans to continue exercise at Home (comment)   Treadmill, elliptical, stretching, might add bike and jogging outside, adding dumbbells/resistance training   Frequency Add 3 additional days to program exercise sessions.    Initial Home Exercises Provided 11/22/23      Oxygen   Maintain Oxygen Saturation 88% or higher             Functional Capacity:  6 Minute Walk     Row Name 10/18/23 1519 01/15/24 1736       6 Minute Walk   Phase Initial Discharge    Distance 1455 feet 1800 feet    Distance % Change -- 23.7 %    Distance Feet Change -- 345 ft    Walk Time 6 minutes 6 minutes    # of Rest Breaks 0 0    MPH 2.76 3.41    METS 4.12 5.19    RPE 11 13    Perceived Dyspnea  0 0    VO2 Peak 14.38 18.15    Symptoms No No    Resting HR 91 bpm 88 bpm    Resting BP 108/74 112/62    Resting Oxygen Saturation  96 % --    Exercise Oxygen Saturation  during 6 min walk 95 % --    Max Ex. HR 105 bpm 112 bpm    Max Ex. BP 114/70 152/80    2 Minute Post BP 108/76 118/62            Nutrition & Weight - Outcomes:  Pre Biometrics - 10/18/23 1526       Pre Biometrics   Height 5' 6.9" (1.699 m)    Weight 170 lb (77.1 kg)    Waist Circumference 36 inches    Hip Circumference  35.5 inches    Waist to Hip Ratio 1.01 %    BMI (Calculated) 26.71    Single Leg Stand 30 seconds             Post Biometrics - 01/15/24 1737        Post  Biometrics   Height 5' 6.9" (1.699 m)    Weight 167 lb 14.4 oz (76.2 kg)    Waist Circumference 33.5 inches    Hip Circumference 37 inches    Waist to Hip Ratio 0.91 %    BMI (Calculated) 26.38             Nutrition:  Nutrition Therapy & Goals - 12/04/23 1656       Personal Nutrition Goals   Personal Goal #2 Eat 15-30gProtein and 30-60gCarbs at each meal.    Personal Goal #3 Read labels and reduce sodium intake to below 2300mg . Ideally 1500mg  per day.    Personal Goal #4 switch to brown rice            Goals reviewed with patient; copy given to patient.

## 2024-01-15 NOTE — Progress Notes (Signed)
 Daily Session Note  Patient Details  Name: Brent Oneill MRN: [id number] Date of Birth: 07/06/1971 Referring Provider:   Flowsheet Row Cardiac Rehab from 10/18/2023 in [facility] Cardiac and Pulmonary Rehab  Referring Provider Dr. Joetta Mustache       Encounter Date: 01/15/2024  Check In:  Session Check In - 01/15/24 1716       Check-In   Supervising physician immediately available to respond to emergencies See telemetry face sheet for immediately available ER MD    Location [facility]-Cardiac & Pulmonary Rehab    Staff Present Sue Em RN,BSN;Joseph [facility] Sabra Cramp BS, ACSM CEP, Exercise Physiologist;Susanne Bice, RN, BSN, CCRP    Virtual Visit No    Medication changes reported     No    Fall or balance concerns reported    No    Warm-up and Cool-down Performed on first and last piece of equipment    Resistance Training Performed Yes    VAD Patient? No    PAD/SET Patient? No      Pain Assessment   Currently in Pain? No/denies                Social History   Tobacco Use  Smoking Status Never  Smokeless Tobacco Never    Goals Met:  Independence with exercise equipment Exercise tolerated well No report of concerns or symptoms today Strength training completed today  Goals Unmet:  Not Applicable  Comments: Pt able to follow exercise prescription today without complaint.  Will continue to monitor for progression.   6 Minute Walk     Row Name 10/18/23 1519 01/15/24 1736       6 Minute Walk   Phase Initial Discharge    Distance 1455 feet 1800 feet    Distance % Change -- 23.7 %    Distance Feet Change -- 345 ft    Walk Time 6 minutes 6 minutes    # of Rest Breaks 0 0    MPH 2.76 3.41    METS 4.12 5.19    RPE 11 13    Perceived Dyspnea  0 0    VO2 Peak 14.38 18.15    Symptoms No No    Resting HR 91 bpm 88 bpm    Resting BP 108/74 112/62    Resting Oxygen Saturation  96 % --    Exercise Oxygen Saturation  during 6 min walk 95 % --    Max  Ex. HR 105 bpm 112 bpm    Max Ex. BP 114/70 152/80    2 Minute Post BP 108/76 118/62               Dr. Firman Hughes is Medical Director for [facility] Cardiac Rehabilitation.  Dr. Fuad Aleskerov is Medical Director for [facility] Pulmonary Rehabilitation.

## 2024-01-17 ENCOUNTER — Encounter: Admitting: *Deleted

## 2024-01-17 DIAGNOSIS — Z48812 Encounter for surgical aftercare following surgery on the circulatory system: Secondary | ICD-10-CM | POA: Diagnosis not present

## 2024-01-17 DIAGNOSIS — I213 ST elevation (STEMI) myocardial infarction of unspecified site: Secondary | ICD-10-CM | POA: Diagnosis not present

## 2024-01-17 NOTE — Progress Notes (Signed)
 Daily Session Note  Patient Details  Name: Rylee Nuzum MRN: [id number] Date of Birth: [birth year] Referring Provider:   Flowsheet Row Cardiac Rehab from 10/18/2023 in [facility] Cardiac and Pulmonary Rehab  Referring Provider Dr. Joetta Mustache       Encounter Date: 01/17/2024  Check In:  Session Check In - 01/17/24 1652       Check-In   Supervising physician immediately available to respond to emergencies See telemetry face sheet for immediately available ER MD    Location [facility]-Cardiac & Pulmonary Rehab    Staff Present Sue Em RN,BSN;Susanne Bice, RN, BSN, CCRP;Joseph Hood RCP,RRT,BSRT;Kelly [place] BS, ACSM CEP, Exercise Physiologist    Virtual Visit No    Medication changes reported     No    Fall or balance concerns reported    No    Warm-up and Cool-down Performed on first and last piece of equipment    Resistance Training Performed Yes    VAD Patient? No    PAD/SET Patient? No      Pain Assessment   Currently in Pain? No/denies                Social History   Tobacco Use  Smoking Status Never  Smokeless Tobacco Never    Goals Met:  Independence with exercise equipment Exercise tolerated well No report of concerns or symptoms today Strength training completed today  Goals Unmet:  Not Applicable  Comments: Pt able to follow exercise prescription today without complaint.  Will continue to monitor for progression.    Dr. Firman Hughes is Medical Director for [facility] Cardiac Rehabilitation.  Dr. Fuad Aleskerov is Medical Director for [facility] Pulmonary Rehabilitation.

## 2024-01-18 ENCOUNTER — Encounter

## 2024-01-18 DIAGNOSIS — I213 ST elevation (STEMI) myocardial infarction of unspecified site: Secondary | ICD-10-CM

## 2024-01-18 DIAGNOSIS — Z48812 Encounter for surgical aftercare following surgery on the circulatory system: Secondary | ICD-10-CM | POA: Diagnosis not present

## 2024-01-18 NOTE — Progress Notes (Signed)
 Daily Session Note  Patient Details  Name: Brent Oneill MRN: [id number] Date of Birth: 11/28/1970 Referring Provider:   Flowsheet Row Cardiac Rehab from 10/18/2023 in [facility] Cardiac and Pulmonary Rehab  Referring Provider Dr. Joetta Mustache       Encounter Date: 01/18/2024  Check In:  Session Check In - 01/18/24 1735       Check-In   Supervising physician immediately available to respond to emergencies See telemetry face sheet for immediately available ER MD    Location [facility]-Cardiac & Pulmonary Rehab    Staff Present Sue Em RN,BSN;Joseph [facility] BS, Exercise Physiologist    Virtual Visit No    Medication changes reported     No    Fall or balance concerns reported    No    Warm-up and Cool-down Performed on first and last piece of equipment    Resistance Training Performed Yes    VAD Patient? No    PAD/SET Patient? No      Pain Assessment   Currently in Pain? No/denies                Social History   Tobacco Use  Smoking Status Never  Smokeless Tobacco Never    Goals Met:  Independence with exercise equipment Exercise tolerated well No report of concerns or symptoms today Strength training completed today  Goals Unmet:  Not Applicable  Comments: Pt able to follow exercise prescription today without complaint.  Will continue to monitor for progression.    Dr. Firman Hughes is Medical Director for [facility] Cardiac Rehabilitation.  Dr. Fuad Aleskerov is Medical Director for [facility] Pulmonary Rehabilitation.

## 2024-01-19 ENCOUNTER — Other Ambulatory Visit: Payer: Self-pay | Admitting: Ophthalmology

## 2024-01-19 ENCOUNTER — Encounter: Payer: Self-pay | Admitting: Ophthalmology

## 2024-01-19 DIAGNOSIS — H0520 Unspecified exophthalmos: Secondary | ICD-10-CM

## 2024-01-20 ENCOUNTER — Ambulatory Visit
Admission: RE | Admit: 2024-01-20 | Discharge: 2024-01-20 | Disposition: A | Discharge status: Home / Self Care | Source: Ambulatory Visit | Attending: Ophthalmology | Admitting: Ophthalmology

## 2024-01-20 DIAGNOSIS — H0520 Unspecified exophthalmos: Secondary | ICD-10-CM

## 2024-01-20 MED ORDER — GADOPICLENOL 0.5 MMOL/ML IV SOLN
7.0000 mL | Freq: Once | INTRAVENOUS | Status: AC | PRN
  Administered 2024-01-20: 7 mL via INTRAVENOUS

## 2024-01-22 ENCOUNTER — Encounter: Admitting: *Deleted

## 2024-01-22 DIAGNOSIS — Z48812 Encounter for surgical aftercare following surgery on the circulatory system: Secondary | ICD-10-CM | POA: Diagnosis not present

## 2024-01-22 DIAGNOSIS — I213 ST elevation (STEMI) myocardial infarction of unspecified site: Secondary | ICD-10-CM | POA: Diagnosis not present

## 2024-01-22 NOTE — Progress Notes (Signed)
 Daily Session Note  Patient Details  Name: Brent Oneill MRN: [id number] Date of Birth: 1971/05/24 Referring Provider:   Flowsheet Row Cardiac Rehab from 10/18/2023 in [facility] Cardiac and Pulmonary Rehab  Referring Provider Dr. Joetta Mustache       Encounter Date: 01/22/2024  Check In:  Session Check In - 01/22/24 1730       Check-In   Supervising physician immediately available to respond to emergencies See telemetry face sheet for immediately available ER MD    Location [facility]-Cardiac & Pulmonary Rehab    Staff Present Rey Catholic RCP,RRT,BSRT;Pieper Kasik Manson Seitz RN,BSN;Kelly Sabra Cramp BS, ACSM CEP, Exercise Physiologist    Virtual Visit No    Medication changes reported     No    Fall or balance concerns reported    No    Warm-up and Cool-down Performed on first and last piece of equipment    Resistance Training Performed Yes    VAD Patient? No    PAD/SET Patient? No      Pain Assessment   Currently in Pain? No/denies                Social History   Tobacco Use  Smoking Status Never  Smokeless Tobacco Never    Goals Met:  Independence with exercise equipment Exercise tolerated well No report of concerns or symptoms today Strength training completed today  Goals Unmet:  Not Applicable  Comments:  Brent Oneill graduated today from  rehab with 36 sessions completed.  Details of the patient's exercise prescription and what He needs to do in order to continue the prescription and progress were discussed with patient.  Patient was given a copy of prescription and goals.  Patient verbalized understanding. Brent Oneill plans to continue to exercise by using his home treadmill, elliptical, bike and rower.    Dr. Firman Hughes is Medical Director for [facility] Cardiac Rehabilitation.  Dr. Fuad Aleskerov is Medical Director for [facility] Pulmonary Rehabilitation.

## 2024-01-22 NOTE — Progress Notes (Signed)
 Discharge Summary   [name] DOB: 01/25/2071    [name] graduated today from  rehab with 36 sessions completed.  Details of the patient's exercise prescription and what He needs to do in order to continue the prescription and progress were discussed with patient.  Patient was given a copy of prescription and goals.  Patient verbalized understanding. Brent Oneill plans to continue to exercise by using his home treadmill, elliptical, bike and rower.   6 Minute Walk     Row Name 10/18/23 1519 01/15/24 1736       6 Minute Walk   Phase Initial Discharge    Distance 1455 feet 1800 feet    Distance % Change -- 23.7 %    Distance Feet Change -- 345 ft    Walk Time 6 minutes 6 minutes    # of Rest Breaks 0 0    MPH 2.76 3.41    METS 4.12 5.19    RPE 11 13    Perceived Dyspnea  0 0    VO2 Peak 14.38 18.15    Symptoms No No    Resting HR 91 bpm 88 bpm    Resting BP 108/74 112/62    Resting Oxygen Saturation  96 % --    Exercise Oxygen Saturation  during 6 min walk 95 % --    Max Ex. HR 105 bpm 112 bpm    Max Ex. BP 114/70 152/80    2 Minute Post BP 108/76 118/62

## 2024-01-22 NOTE — Progress Notes (Signed)
 Cardiac Individual Treatment Plan  Patient Details  Name: Lafredrick Islas MRN: [id number] Date of Birth: May 05, 1971 Referring Provider:   Flowsheet Row Cardiac Rehab from 10/18/2023 in [facility] Cardiac and Pulmonary Rehab  Referring Provider Dr. Joetta Mustache       Initial Encounter Date:  Flowsheet Row Cardiac Rehab from 10/18/2023 in [facility] Cardiac and Pulmonary Rehab  Date 10/18/23       Visit Diagnosis: ST elevation myocardial infarction (STEMI), unspecified artery (HCC)  Patient's Home Medications on Admission:  Current Outpatient Medications:    acetaminophen  (TYLENOL ) 325 MG tablet, Take 2 tablets (650 mg total) by mouth every 6 (six) hours as needed for mild pain (pain score 1-3) (or Fever >/= 101)., Disp: 30 tablet, Rfl: 0   albuterol (VENTOLIN HFA) 108 (90 Base) MCG/ACT inhaler, Inhale 2 puffs into the lungs every 6 (six) hours as needed for wheezing or shortness of breath., Disp: , Rfl:    aspirin  81 MG chewable tablet, Chew 1 tablet (81 mg total) by mouth daily., Disp: 30 tablet, Rfl: 11   clopidogrel  (PLAVIX ) 75 MG tablet, Take 1 tablet (75 mg total) by mouth daily with breakfast., Disp: 90 tablet, Rfl: 11   finasteride  (PROSCAR ) 5 MG tablet, Take 1 tablet (5 mg total) by mouth daily., Disp: 90 tablet, Rfl: 3   fluticasone  (FLONASE) 50 MCG/ACT nasal spray, SPRAY 2 SPRAYS INTO EACH NOSTRIL EVERY DAY, Disp: 48 mL, Rfl: 3   fluticasone  furoate-vilanterol (BREO ELLIPTA ) 200-25 MCG/ACT AEPB, Inhale 1 puff into the lungs daily., Disp: 30 each, Rfl: 2   metFORMIN  (GLUCOPHAGE ) 850 MG tablet, Take 1 tablet (850 mg total) by mouth 2 (two) times daily with a meal., Disp: 180 tablet, Rfl: 0   metoprolol  tartrate (LOPRESSOR ) 50 MG tablet, Take 1 tablet (50 mg total) by mouth 2 (two) times daily., Disp: 60 tablet, Rfl: 12   rosuvastatin  (CRESTOR ) 40 MG tablet, Take 1 tablet (40 mg total) by mouth daily., Disp: 30 tablet, Rfl: 11   tamsulosin  (FLOMAX ) 0.4 MG CAPS capsule, Take 1 capsule (0.4 mg  total) by mouth daily., Disp: 90 capsule, Rfl: 4   Vitamin D, Ergocalciferol, (DRISDOL) 1.25 MG (50000 UNIT) CAPS capsule, Take 50,000 Units by mouth every 7 (seven) days., Disp: , Rfl:   Past Medical History: Past Medical History:  Diagnosis Date   Allergic rhinitis 03/14/2016   Asthma, persistent 03/14/2016    Tobacco Use: Social History   Tobacco Use  Smoking Status Never  Smokeless Tobacco Never    Labs: Review Flowsheet       Latest Ref Rng & Units 04/21/2023 08/11/2023 09/09/2023 11/30/2023  Labs for ITP Cardiac and Pulmonary Rehab  Cholestrol 100 - 199 mg/dL 956  213  086  578   LDL (calc) 0 - 99 mg/dL 469  629  528  43   HDL-C >39 mg/dL 45  55  48  53   Trlycerides 0 - 149 mg/dL 413  244  010  78   Hemoglobin A1c 4.8 - 5.6 % - 6.4  6.8  5.3      Exercise Target Goals: Exercise Program Goal: Individual exercise prescription set using results from initial 6 min walk test and THRR while considering  patient's activity barriers and safety.   Exercise Prescription Goal: Initial exercise prescription builds to 30-45 minutes a day of aerobic activity, 2-3 days per week.  Home exercise guidelines will be given to patient during program as part of exercise prescription that the participant will acknowledge.  Education: Aerobic Exercise: - Group verbal and visual presentation on the components of exercise prescription. Introduces F.I.T.T principle from ACSM for exercise prescriptions.  Reviews F.I.T.T. principles of aerobic exercise including progression. Written material given at graduation.   Education: Resistance Exercise: - Group verbal and visual presentation on the components of exercise prescription. Introduces F.I.T.T principle from ACSM for exercise prescriptions  Reviews F.I.T.T. principles of resistance exercise including progression. Written material given at graduation.    Education: Exercise & Equipment Safety: - Individual verbal instruction and demonstration  of equipment use and safety with use of the equipment. Flowsheet Row Cardiac Rehab from 01/17/2024 in [facility] Cardiac and Pulmonary Rehab  Date 10/18/23  Educator MB  Instruction Review Code 1- Verbalizes Understanding       Education: Exercise Physiology & General Exercise Guidelines: - Group verbal and written instruction with models to review the exercise physiology of the cardiovascular system and associated critical values. Provides general exercise guidelines with specific guidelines to those with heart or lung disease.    Education: Flexibility, Balance, Mind/Body Relaxation: - Group verbal and visual presentation with interactive activity on the components of exercise prescription. Introduces F.I.T.T principle from ACSM for exercise prescriptions. Reviews F.I.T.T. principles of flexibility and balance exercise training including progression. Also discusses the mind body connection.  Reviews various relaxation techniques to help reduce and manage stress (i.e. Deep breathing, progressive muscle relaxation, and visualization). Balance handout provided to take home. Written material given at graduation.   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, L hip flexor tendinitis   Cardiac Risk Stratification High             6 Minute Walk:  6 Minute Walk     Row Name 10/18/23 1519 01/15/24 1736       6 Minute Walk   Phase Initial Discharge    Distance 1455 feet 1800 feet    Distance % Change -- 23.7 %    Distance Feet Change -- 345 ft    Walk Time 6 minutes 6 minutes    # of Rest Breaks 0 0    MPH 2.76 3.41    METS 4.12 5.19    RPE 11 13    Perceived Dyspnea  0 0    VO2 Peak 14.38 18.15    Symptoms No No    Resting HR 91 bpm 88 bpm    Resting BP  108/74 112/62    Resting Oxygen Saturation  96 % --    Exercise Oxygen Saturation  during 6 min walk 95 % --    Max Ex. HR 105 bpm 112 bpm    Max Ex. BP 114/70 152/80    2 Minute Post BP 108/76 118/62             Oxygen Initial Assessment:   Oxygen Re-Evaluation:   Oxygen Discharge (Final Oxygen Re-Evaluation):   Initial Exercise Prescription:  Initial Exercise Prescription - 10/18/23 1500       Date of Initial Exercise RX and Referring Provider   Date 10/18/23    Referring Provider Dr. Joetta Mustache      Oxygen   Maintain Oxygen Saturation 88% or higher      Treadmill   MPH 2.8    Grade 0.5    Minutes 15  METs 3.34      NuStep   Level 4    SPM 80    Minutes 15    METs 4.12      Elliptical   Level 1    Speed 3    Minutes 15    METs 4.12      REL-XR   Level 3    Speed 50    Minutes 15    METs 4.12      T5 Nustep   Level 4   T6   SPM 80    Minutes 15    METs 4.12      Prescription Details   Frequency (times per week) 3    Duration Progress to 30 minutes of continuous aerobic without signs/symptoms of physical distress      Intensity   THRR 40-80% of Max Heartrate 118-151    Ratings of Perceived Exertion 11-13    Perceived Dyspnea 0-4      Progression   Progression Continue to progress workloads to maintain intensity without signs/symptoms of physical distress.      Resistance Training   Training Prescription Yes    Weight 5 lb    Reps 10-15             Perform Capillary Blood Glucose checks as needed.  Exercise Prescription Changes:   Exercise Prescription Changes     Row Name 10/18/23 1500 11/22/23 1600 11/23/23 1600 12/07/23 1600 12/21/23 1500     Response to Exercise   Blood Pressure (Admit) 108/74 -- 140/72 100/64 106/60   Blood Pressure (Exercise) 114/70 -- 188/80 162/78 --   Blood Pressure (Exit) 108/76 -- 100/60 100/62 102/62   Heart Rate (Admit) 91 bpm -- 108 bpm 76 bpm 88 bpm   Heart Rate (Exercise) 105 bpm  -- 140 bpm 140 bpm 157 bpm   Heart Rate (Exit) 85 bpm -- 87 bpm 101 bpm 93 bpm   Oxygen Saturation (Admit) 96 % -- -- -- --   Oxygen Saturation (Exercise) 95 % -- -- -- --   Oxygen Saturation (Exit) 98 % -- -- -- --   Rating of Perceived Exertion (Exercise) 11 -- 14 15 15    Perceived Dyspnea (Exercise) 0 -- -- -- --   Symptoms none -- none none none   Comments results -- First two weeks of rehab -- --   Duration Progress to 30 minutes of  aerobic without signs/symptoms of physical distress -- Continue with 30 min of aerobic exercise without signs/symptoms of physical distress. Continue with 30 min of aerobic exercise without signs/symptoms of physical distress. Continue with 30 min of aerobic exercise without signs/symptoms of physical distress.   Intensity THRR New -- THRR unchanged THRR unchanged THRR unchanged     Progression   Progression Continue to progress workloads to maintain intensity without signs/symptoms of physical distress. -- Continue to progress workloads to maintain intensity without signs/symptoms of physical distress. Continue to progress workloads to maintain intensity without signs/symptoms of physical distress. Continue to progress workloads to maintain intensity without signs/symptoms of physical distress.   Average METs 4.12 -- 4.44 5.66 5.17     Resistance Training   Training Prescription -- -- Yes Yes Yes   Weight -- -- 5 lb 5 lb 5 lb   Reps -- -- 10-15 10-15 10-15     Interval Training   Interval Training -- -- No No No     Treadmill   MPH -- -- 3.5 4 3.5  Grade -- -- 2.5 1.5 2.5   Minutes -- -- 15 15 15    METs -- -- 4.89 4.89 4.89     NuStep   Level -- -- 5 7 5    Minutes -- -- 15 15 15    METs -- -- -- 7.5 5.1     Elliptical   Level -- -- 3 -- 6   Speed -- -- 3.7 -- 3.3   Minutes -- -- 15 -- 15   METs -- -- 3.1 -- 4.3     REL-XR   Level -- -- 8 8 --   Minutes -- -- 15 15 --   METs -- -- -- 8 --     T5 Nustep   Level -- -- 5  T6 nustep 5   T6 nustep --   Minutes -- -- 15 15 --   METs -- -- -- 3.8 --     Home Exercise Plan   Plans to continue exercise at -- Home (comment)  Treadmill, elliptical, stretching, might add bike and jogging outside, adding dumbbells/resistance training Home (comment)  Treadmill, elliptical, stretching, might add bike and jogging outside, adding dumbbells/resistance training -- Home (comment)  Treadmill, elliptical, stretching, might add bike and jogging outside, adding dumbbells/resistance training   Frequency -- Add 3 additional days to program exercise sessions. Add 3 additional days to program exercise sessions. -- Add 3 additional days to program exercise sessions.   Initial Home Exercises Provided -- 11/22/23 11/22/23 -- 11/22/23     Oxygen   Maintain Oxygen Saturation -- -- 88% or higher -- 88% or higher    Row Name 01/05/24 0900 01/16/24 1400           Response to Exercise   Blood Pressure (Admit) 102/60 102/60      Blood Pressure (Exit) 102/62 110/64      Heart Rate (Admit) 95 bpm 70 bpm      Heart Rate (Exercise) 150 bpm 141 bpm      Heart Rate (Exit) 109 bpm 103 bpm      Rating of Perceived Exertion (Exercise) 15 15      Symptoms none none      Duration Continue with 30 min of aerobic exercise without signs/symptoms of physical distress. Continue with 30 min of aerobic exercise without signs/symptoms of physical distress.      Intensity THRR unchanged THRR unchanged        Progression   Progression Continue to progress workloads to maintain intensity without signs/symptoms of physical distress. Continue to progress workloads to maintain intensity without signs/symptoms of physical distress.      Average METs 5.84 5.08        Resistance Training   Training Prescription Yes Yes      Weight 7 lb 7 lb      Reps 10-15 10-15        Interval Training   Interval Training No No        Treadmill   MPH 3.5 3.5      Grade 3 3      Minutes 15 15      METs 5.13 5.13        Elliptical    Level 10 8      Speed 3.3 3.3      Minutes 15 15      METs 6.2 5.9        REL-XR   Level 10 8      Minutes 15 15  METs 8.2 --        Home Exercise Plan   Plans to continue exercise at Home (comment)  Treadmill, elliptical, stretching, might add bike and jogging outside, adding dumbbells/resistance training Home (comment)  Treadmill, elliptical, stretching, might add bike and jogging outside, adding dumbbells/resistance training      Frequency Add 3 additional days to program exercise sessions. Add 3 additional days to program exercise sessions.      Initial Home Exercises Provided 11/22/23 11/22/23        Oxygen   Maintain Oxygen Saturation 88% or higher 88% or higher               Exercise Comments:   Exercise Comments     Row Name 10/23/23 1550           Exercise Comments First full day of exercise!  Patient was oriented to gym and equipment including functions, settings, policies, and procedures.  Patient's individual exercise prescription and treatment plan were reviewed.  All starting workloads were established based on the results of the 6 minute walk test done at initial orientation visit.  The plan for exercise progression was also introduced and progression will be customized based on patient's performance and goals.                Exercise Goals and Review:   Exercise Goals     Row Name 10/18/23 1525             Exercise Goals   Increase Physical Activity Yes       Intervention Provide advice, education, support and counseling about physical activity/exercise needs.;Develop an individualized exercise prescription for aerobic and resistive training based on initial evaluation findings, risk stratification, comorbidities and participant's personal goals.       Expected Outcomes Short Term: Attend rehab on a regular basis to increase amount of physical activity.;Long Term: Add in home exercise to make exercise part of routine and to increase amount  of physical activity.;Long Term: Exercising regularly at least 3-5 days a week.       Increase Strength and Stamina Yes       Intervention Provide advice, education, support and counseling about physical activity/exercise needs.;Develop an individualized exercise prescription for aerobic and resistive training based on initial evaluation findings, risk stratification, comorbidities and participant's personal goals.       Expected Outcomes Short Term: Increase workloads from initial exercise prescription for resistance, speed, and METs.;Short Term: Perform resistance training exercises routinely during rehab and add in resistance training at home;Long Term: Improve cardiorespiratory fitness, muscular endurance and strength as measured by increased METs and functional capacity ( )       Able to understand and use rate of perceived exertion (RPE) scale Yes       Intervention Provide education and explanation on how to use RPE scale       Expected Outcomes Short Term: Able to use RPE daily in rehab to express subjective intensity level;Long Term:  Able to use RPE to guide intensity level when exercising independently       Able to understand and use Dyspnea scale Yes       Intervention Provide education and explanation on how to use Dyspnea scale       Expected Outcomes Short Term: Able to use Dyspnea scale daily in rehab to express subjective sense of shortness of breath during exertion;Long Term: Able to use Dyspnea scale to guide intensity level when exercising independently  Knowledge and understanding of Target Heart Rate Range (THRR) Yes       Intervention Provide education and explanation of THRR including how the numbers were predicted and where they are located for reference       Expected Outcomes Short Term: Able to state/look up THRR;Short Term: Able to use daily as guideline for intensity in rehab;Long Term: Able to use THRR to govern intensity when exercising independently       Able  to check pulse independently Yes       Intervention Provide education and demonstration on how to check pulse in carotid and radial arteries.;Review the importance of being able to check your own pulse for safety during independent exercise       Expected Outcomes Short Term: Able to explain why pulse checking is important during independent exercise;Long Term: Able to check pulse independently and accurately       Understanding of Exercise Prescription Yes       Intervention Provide education, explanation, and written materials on patient's individual exercise prescription       Expected Outcomes Short Term: Able to explain program exercise prescription;Long Term: Able to explain home exercise prescription to exercise independently                Exercise Goals Re-Evaluation :  Exercise Goals Re-Evaluation     Row Name 10/23/23 1551 11/22/23 1621 11/23/23 1611 12/04/23 1651 12/07/23 1622     Exercise Goal Re-Evaluation   Exercise Goals Review Increase Physical Activity;Able to understand and use rate of perceived exertion (RPE) scale;Knowledge and understanding of Target Heart Rate Range (THRR);Understanding of Exercise Prescription;Increase Strength and Stamina;Able to check pulse independently Increase Physical Activity;Able to understand and use Dyspnea scale;Understanding of Exercise Prescription;Increase Strength and Stamina;Knowledge and understanding of Target Heart Rate Range (THRR);Able to understand and use rate of perceived exertion (RPE) scale;Able to check pulse independently Increase Physical Activity;Increase Strength and Stamina;Understanding of Exercise Prescription Increase Physical Activity Increase Physical Activity;Increase Strength and Stamina;Understanding of Exercise Prescription   Comments Reviewed RPE and dyspnea scale, THR and program prescription with pt today.  Pt voiced understanding and was given a copy of goals to take home. Reviewed home exercise with pt today.   Pt plans to use the treadmill, elliptical, stretch, and might add stationary bike, jogging outside (after trying in rehab), and adding dumbbells/resistance training for at least 3 additional days at home for exercise.  Reviewed THR, pulse, RPE, sign and symptoms, pulse oximetery and when to call 911 or MD.  Also discussed weather considerations and indoor options.  Pt voiced understanding. Theo is off to a good start in rehab. He recently increased his treadmill workload to a speed of 3.5 mph and an incline of 2.5%. He also improved to level 5 on the T4 and T6 nustep,  and level 3 on the elliptical. We will continue to monitor his progress in the program. THeo is walking and exercising at home/sessions for 5-6 days a week. Theo is doing well in rehab. He increased his treadmill speed to 4 mph while maintaining an incline of 1.5%. He also improved to level 7 on the T4 nustep and continued at level 8 o the XR and level 5 on the T6 nustep. We will continue to monitor his progress in the program.   Expected Outcomes Short: Use RPE daily to regulate intensity.  Long: Follow program prescription in THR. Short: Add 3 additional days at home for exercise. Long: Continue to exercise at home  independently. Short: Continue to follow current exercise prescription. Long: Continue exercise to improve strength and stamina. STG continue to exercise 5-6 days a week, keeping exercise progression.  LTG strength and stamina continues to improve Short: Continue to progressively increase treadmill workload. Long: Continue exercise to improve strength and stamina.    Row Name 12/20/23 1605 12/21/23 1534 01/05/24 0936 01/16/24 1450 01/22/24 1730     Exercise Goal Re-Evaluation   Exercise Goals Review Increase Physical Activity;Increase Strength and Stamina;Understanding of Exercise Prescription Increase Physical Activity;Increase Strength and Stamina;Understanding of Exercise Prescription Increase Physical Activity;Increase Strength  and Stamina;Understanding of Exercise Prescription Increase Physical Activity;Increase Strength and Stamina;Understanding of Exercise Prescription Increase Physical Activity;Increase Strength and Stamina;Understanding of Exercise Prescription   Comments Anise Kerns is doing well in rehab, he is using his exercise bike, eliptical and treadmill at home on days he is not at rehab. Commended him and reminded him that as he graduates to look to continue exercise at home. Theo continues to do well in rehab. He recently improved to level 6 on the elliptical with a speed of 3.3 mph. He also continues to walk the treadmill at a speed of 3.5 mph with an incline of 2.5%. He has done well with 5 lb hand weights for resistance training as well. We will continue to monitor his progress in the program. Theo continues to do well in rehab. He recently improved to level 10 on the elliptical and XR. He also increased his treadmill workload at a speed of 3.5 mph with an incline of 3%. He increased to 7 lb hand weights for resistance training as well. We will continue to monitor his progress in the program. Anise Kerns is doing well in rehab. He was able to maintain his workload on the treadmill at a speed of 3. and 3% incline. He was also able to use both the elliptical and XR at level 8. We will continue to monitor his progress in the program. Anise Kerns is graduating from cardiac rehab today. He plans to continue to exercise at home with his TM, EL, bike, and rower machines. He is also looking into exercising at a gym once winter comes. He has done well in the program and reports significant gains in strength and stamina.   Expected Outcomes STG: Continue to increase workload LTG: Continue to exercise to improve strength and stamina Short: Increase to 6 lb hand weights for resistance training. Long: Continue exercise to improve strength and stamina. Short: Continue to progressively increase treadmill workload. Long: Continue exercise to improve  strength and stamina. Short: Improve on post-6MWT. Long: Continue exercise to improve strength and stamina. Short: graduate from cardiac rehab. Long: maintain independent exercise routine.            Discharge Exercise Prescription (Final Exercise Prescription Changes):  Exercise Prescription Changes - 01/16/24 1400       Response to Exercise   Blood Pressure (Admit) 102/60    Blood Pressure (Exit) 110/64    Heart Rate (Admit) 70 bpm    Heart Rate (Exercise) 141 bpm    Heart Rate (Exit) 103 bpm    Rating of Perceived Exertion (Exercise) 15    Symptoms none    Duration Continue with 30 min of aerobic exercise without signs/symptoms of physical distress.    Intensity THRR unchanged      Progression   Progression Continue to progress workloads to maintain intensity without signs/symptoms of physical distress.    Average METs 5.08      Resistance Training  Training Prescription Yes    Weight 7 lb    Reps 10-15      Interval Training   Interval Training No      Treadmill   MPH 3.5    Grade 3    Minutes 15    METs 5.13      Elliptical   Level 8    Speed 3.3    Minutes 15    METs 5.9      REL-XR   Level 8    Minutes 15      Home Exercise Plan   Plans to continue exercise at Home (comment)   Treadmill, elliptical, stretching, might add bike and jogging outside, adding dumbbells/resistance training   Frequency Add 3 additional days to program exercise sessions.    Initial Home Exercises Provided 11/22/23      Oxygen   Maintain Oxygen Saturation 88% or higher             Nutrition:  Target Goals: Understanding of nutrition guidelines, daily intake of sodium 1500mg , cholesterol 200mg , calories 30% from fat and 7% or less from saturated fats, daily to have 5 or more servings of fruits and vegetables.  Education: All About Nutrition: -Group instruction provided by verbal, written material, interactive activities, discussions, models, and posters to present  general guidelines for heart healthy nutrition including fat, fiber, MyPlate, the role of sodium in heart healthy nutrition, utilization of the nutrition label, and utilization of this knowledge for meal planning. Follow up email sent as well. Written material given at graduation.   Biometrics:  Pre Biometrics - 10/18/23 1526       Pre Biometrics   Height 5' 6.9" (1.699 m)    Weight 170 lb (77.1 kg)    Waist Circumference 36 inches    Hip Circumference 35.5 inches    Waist to Hip Ratio 1.01 %    BMI (Calculated) 26.71    Single Leg Stand 30 seconds             Post Biometrics - 01/17/24 1725        Post  Biometrics   Waist Circumference 33.5 inches    Hip Circumference 37 inches    Waist to Hip Ratio 0.91 %    Single Leg Stand 30 seconds             Nutrition Therapy Plan and Nutrition Goals:  Nutrition Therapy & Goals - 12/04/23 1656       Personal Nutrition Goals   Personal Goal #2 Eat 15-30gProtein and 30-60gCarbs at each meal.    Personal Goal #3 Read labels and reduce sodium intake to below 2300mg . Ideally 1500mg  per day.    Personal Goal #4 switch to brown rice             Nutrition Assessments:  MEDIFICTS Score Key: >=70 Need to make dietary changes  40-70 Heart Healthy Diet <= 40 Therapeutic Level Cholesterol Diet  Flowsheet Row Cardiac Rehab from 01/22/2024 in [facility]  Picture Your Plate Total Score on Discharge 72      Picture Your Plate Scores: <40 Unhealthy dietary pattern with much room for improvement. 41-50 Dietary pattern unlikely to meet recommendations for good health and room for improvement. 51-60 More healthful dietary pattern, with some room for improvement.  >60 Healthy dietary pattern, although there may be some specific behaviors that could be improved.    Nutrition Goals Re-Evaluation:  Nutrition Goals Re-Evaluation     Row Name  12/04/23 1657 12/20/23 1621 01/22/24 1734         Goals    Comment Theo has switched to brown rice, he has tried 2 brands.  "it's not sticky".  He is still eating it versus the white rice. He is adding more carb to his meal plan. stated with  steamed potatoes. He is not drinking sodas, only water. He has been reading food labels and working on decreaseing sodium intake Anise Kerns reports he is doing better with making healthy food choices. Spoke about including more carbs at meals since he reports his energy being low later in the day. Currently he is not eating many carbs and could use some at each meal. He likes fruit so set goal to include more fruit at meals that dont have carbs. Anise Kerns reports that he is mostly eating a heart healthy diet and has just about completely but out processed sugar. He is graduating cardiac reahb today and plans to continue with a heart healthy diet.     Expected Outcome STG continue to work on suggested changes, asking RD for help as needed.  LTG has developed a meal plan with the RD advice that works for his family and THeo. STG: Include more fruits at meals and snacks. LTG: Meet nutrtion needs and follow heart healthy lifestyle Short: graduat from cardiac rehab. Long: maintain heart healthy diet.       Personal Goal #2 Re-Evaluation   Personal Goal #2 Eat 15-30gProtein and 30-60gCarbs at each meal. -- --       Personal Goal #3 Re-Evaluation   Personal Goal #3 Read labels and reduce sodium intake to below 2300mg . Ideally 1500mg  per day. -- --       Personal Goal #4 Re-Evaluation   Personal Goal #4 switch to brown rice -- --              Nutrition Goals Discharge (Final Nutrition Goals Re-Evaluation):  Nutrition Goals Re-Evaluation - 01/22/24 1734       Goals   Comment Theo reports that he is mostly eating a heart healthy diet and has just about completely but out processed sugar. He is graduating cardiac reahb today and plans to continue with a heart healthy diet.    Expected Outcome Short: graduat from cardiac rehab. Long:  maintain heart healthy diet.             Psychosocial: Target Goals: Acknowledge presence or absence of significant depression and/or stress, maximize coping skills, provide positive support system. Participant is able to verbalize types and ability to use techniques and skills needed for reducing stress and depression.   Education: Stress, Anxiety, and Depression - Group verbal and visual presentation to define topics covered.  Reviews how body is impacted by stress, anxiety, and depression.  Also discusses healthy ways to reduce stress and to treat/manage anxiety and depression.  Written material given at graduation.   Education: Sleep Hygiene -Provides group verbal and written instruction about how sleep can affect your health.  Define sleep hygiene, discuss sleep cycles and impact of sleep habits. Review good sleep hygiene tips.    Initial Review & Psychosocial Screening:  Initial Psych Review & Screening - 10/11/23 1428       Initial Review   Current issues with None Identified      Family Dynamics   Good Support System? Yes   wife daughter and son     Barriers   Psychosocial barriers to participate in program There are no identifiable barriers  or psychosocial needs.      Screening Interventions   Interventions To provide support and resources with identified psychosocial needs;Provide feedback about the scores to participant    Expected Outcomes Short Term goal: Utilizing psychosocial counselor, staff and physician to assist with identification of specific Stressors or current issues interfering with healing process. Setting desired goal for each stressor or current issue identified.;Long Term Goal: Stressors or current issues are controlled or eliminated.;Short Term goal: Identification and review with participant of any Quality of Life or Depression concerns found by scoring the questionnaire.;Long Term goal: The participant improves quality of Life and PHQ9 Scores as seen  by post scores and/or verbalization of changes             Quality of Life Scores:   Quality of Life - 01/22/24 1726       Quality of Life   Select Quality of Life      Quality of Life Scores   Health/Function Pre 26 %    Health/Function Post 26 %    Health/Function % Change 0 %    Socioeconomic Pre 24.75 %    Socioeconomic Post 28.19 %    Socioeconomic % Change  13.9 %    Psych/Spiritual Pre 28.29 %    Psych/Spiritual Post 28.29 %    Psych/Spiritual % Change 0 %    Family Pre 30 %    Family Post 28.8 %    Family % Change -4 %    GLOBAL Pre 26.74 %    GLOBAL Post 27.36 %    GLOBAL % Change 2.32 %            Scores of 19 and below usually indicate a poorer quality of life in these areas.  A difference of  2-3 points is a clinically meaningful difference.  A difference of 2-3 points in the total score of the Quality of Life Index has been associated with significant improvement in overall quality of life, self-image, physical symptoms, and general health in studies assessing change in quality of life.  PHQ-9: Review Flowsheet       01/22/2024 10/18/2023 08/25/2023  Depression screen PHQ 2/9  Decreased Interest 0 0 0  Down, Depressed, Hopeless 0 0 0  PHQ - 2 Score 0 0 0  Altered sleeping 1 0 0  Tired, decreased energy 1 2 1   Change in appetite 0 0 0  Feeling bad or failure about yourself  0 0 0  Trouble concentrating 0 1 0  Moving slowly or fidgety/restless 0 0 0  Suicidal thoughts 0 0 0  PHQ-9 Score 2 3 1   Difficult doing work/chores Not difficult at all Somewhat difficult -   Interpretation of Total Score  Total Score Depression Severity:  1-4 = Minimal depression, 5-9 = Mild depression, 10-14 = Moderate depression, 15-19 = Moderately severe depression, 20-27 = Severe depression   Psychosocial Evaluation and Intervention:  Psychosocial Evaluation - 10/11/23 1441       Psychosocial Evaluation & Interventions   Interventions Encouraged to exercise with  the program and follow exercise prescription    Comments ther are no barriers to attending the program.  He lives with his wife,daughter and son.  THey are his support team. He is ready to start the program amd meet with the RD to work on a  heart healthy weight loss plan.    Expected Outcomes STG attends all scheduled sessions, works on exericse progression and nutrition goals LTG able to continue with  exercise progression and nutrition goals after discharge    Continue Psychosocial Services  Follow up required by staff             Psychosocial Re-Evaluation:  Psychosocial Re-Evaluation     Row Name 12/04/23 1654 12/20/23 1613 01/22/24 1732         Psychosocial Re-Evaluation   Current issues with None Identified Current Sleep Concerns None Identified     Comments THeo has no concerns or barriers. He is making lifestyle changes and realizing that he is feeling better and is very pleased with his latest lab results Anise Kerns reports no stress or anxiety. But he does struggle with sleep and has for years. He has been working with his Dr to find solutions. Anise Kerns reports that he is resting well at night and that he has no concerns with stress or mental health. He will graduat from cardiac rehab today.     Expected Outcomes STG continue to attend scheduled sessions and follow the healthy guidelines he is learning in the program.  LTG  continue with healthy lifestyle STG: Continue to communicate with Dr about medication and sleep habits. LTG: Continue with healthy lifestyle Short: Graduate from cardiac rehab. Long: maintian good mental health habits.     Interventions Encouraged to attend Cardiac Rehabilitation for the exercise Encouraged to attend Cardiac Rehabilitation for the exercise --     Continue Psychosocial Services  Follow up required by staff Follow up required by staff No Follow up required  graduating              Psychosocial Discharge (Final Psychosocial Re-Evaluation):   Psychosocial Re-Evaluation - 01/22/24 1732       Psychosocial Re-Evaluation   Current issues with None Identified    Comments Theo reports that he is resting well at night and that he has no concerns with stress or mental health. He will graduat from cardiac rehab today.    Expected Outcomes Short: Graduate from cardiac rehab. Long: maintian good mental health habits.    Continue Psychosocial Services  No Follow up required   graduating            Vocational Rehabilitation: Provide vocational rehab assistance to qualifying candidates.   Vocational Rehab Evaluation & Intervention:  Vocational Rehab - 10/11/23 1431       Initial Vocational Rehab Evaluation & Intervention   Assessment shows need for Vocational Rehabilitation No             Education: Education Goals: Education classes will be provided on a variety of topics geared toward better understanding of heart health and risk factor modification. Participant will state understanding/return demonstration of topics presented as noted by education test scores.  Learning Barriers/Preferences:  Learning Barriers/Preferences - 10/11/23 1429       Learning Barriers/Preferences   Learning Barriers None    Learning Preferences None             General Cardiac Education Topics:  AED/CPR: - Group verbal and written instruction with the use of models to demonstrate the basic use of the AED with the basic ABC's of resuscitation.   Anatomy and Cardiac Procedures: - Group verbal and visual presentation and models provide information about basic cardiac anatomy and function. Reviews the testing methods done to diagnose heart disease and the outcomes of the test results. Describes the treatment choices: Medical Management, Angioplasty, or Coronary Bypass Surgery for treating various heart conditions including Myocardial Infarction, Angina, Valve Disease, and Cardiac Arrhythmias.  Written material  given at  graduation. Flowsheet Row Cardiac Rehab from [facility]/17/2024 in [facility] Cardiac and Pulmonary Rehab  Date 10/25/23  Educator SB  Instruction Review Code 1- Verbalizes Understanding       Medication Safety: - Group verbal and visual instruction to review commonly prescribed medications for heart and lung disease. Reviews the medication, class of the drug, and side effects. Includes the steps to properly store meds and maintain the prescription regimen.  Written material given at graduation.   Intimacy: - Group verbal instruction through game format to discuss how heart and lung disease can affect sexual intimacy. Written material given at graduation..   Know Your Numbers and Heart Failure: - Group verbal and visual instruction to discuss disease risk factors for cardiac and pulmonary disease and treatment options.  Reviews associated critical values for Overweight/Obesity, Hypertension, Cholesterol, and Diabetes.  Discusses basics of heart failure: signs/symptoms and treatments.  Introduces Heart Failure Zone chart for action plan for heart failure.  Written material given at graduation. Flowsheet Row Cardiac Rehab from 01/17/2024 in [facility] Cardiac and Pulmonary Rehab  Date 01/17/24  Educator SB  Instruction Review Code 1- Verbalizes Understanding       Infection Prevention: - Provides verbal and written material to individual with discussion of infection control including proper hand washing and proper equipment cleaning during exercise session. Flowsheet Row Cardiac Rehab from 01/17/2024 in [facility] Cardiac and Pulmonary Rehab  Date 10/18/23  Educator MB  Instruction Review Code 1- Verbalizes Understanding       Falls Prevention: - Provides verbal and written material to individual with discussion of falls prevention and safety. Flowsheet Row Cardiac Rehab from 01/17/2024 in [facility] Cardiac and Pulmonary Rehab  Date 10/18/23  Educator MB  Instruction Review Code 1- Verbalizes Understanding        Other: -Provides group and verbal instruction on various topics (see comments)   Knowledge Questionnaire Score:  Knowledge Questionnaire Score - 01/22/24 1726       Knowledge Questionnaire Score   Post Score 24/26             Core Components/Risk Factors/Patient Goals at Admission:  Personal Goals and Risk Factors at Admission - 10/18/23 1533       Core Components/Risk Factors/Patient Goals on Admission    Weight Management Yes;Weight Loss    Intervention Weight Management: Develop a combined nutrition and exercise program designed to reach desired caloric intake, while maintaining appropriate intake of nutrient and fiber, sodium and fats, and appropriate energy expenditure required for the weight goal.;Weight Management: Provide education and appropriate resources to help participant work on and attain dietary goals.;Weight Management/Obesity: Establish reasonable short term and long term weight goals.    Admit Weight 170 lb (77.1 kg)    Goal Weight: Short Term 160 lb (72.6 kg)    Goal Weight: Long Term 150 lb (68 kg)    Expected Outcomes Short Term: Continue to assess and modify interventions until short term weight is achieved;Long Term: Adherence to nutrition and physical activity/exercise program aimed toward attainment of established weight goal;Weight Loss: Understanding of general recommendations for a balanced deficit meal plan, which promotes 1-2 lb weight loss per week and includes a negative energy balance of [phone] kcal/d;Understanding recommendations for meals to include 15-35% energy as protein, 25-35% energy from fat, 35-60% energy from carbohydrates, less than 200mg  of dietary cholesterol, 20-35 gm of total fiber daily;Understanding of distribution of calorie intake throughout the day with the consumption of 4-5 meals/snacks    Diabetes Yes  Intervention Provide education about signs/symptoms and action to take for hypo/hyperglycemia.;Provide education about  proper nutrition, including hydration, and aerobic/resistive exercise prescription along with prescribed medications to achieve blood glucose in normal ranges: Fasting glucose 65-99 mg/dL    Expected Outcomes Short Term: Participant verbalizes understanding of the signs/symptoms and immediate care of hyper/hypoglycemia, proper foot care and importance of medication, aerobic/resistive exercise and nutrition plan for blood glucose control.;Long Term: Attainment of HbA1C < 7%.    Hypertension Yes    Intervention Provide education on lifestyle modifcations including regular physical activity/exercise, weight management, moderate sodium restriction and increased consumption of fresh fruit, vegetables, and low fat dairy, alcohol moderation, and smoking cessation.;Monitor prescription use compliance.    Expected Outcomes Short Term: Continued assessment and intervention until BP is < 140/22mm HG in hypertensive participants. < 130/25mm HG in hypertensive participants with diabetes, heart failure or chronic kidney disease.;Long Term: Maintenance of blood pressure at goal levels.    Lipids Yes    Intervention Provide education and support for participant on nutrition & aerobic/resistive exercise along with prescribed medications to achieve LDL 70mg , HDL >40mg .    Expected Outcomes Short Term: Participant states understanding of desired cholesterol values and is compliant with medications prescribed. Participant is following exercise prescription and nutrition guidelines.;Long Term: Cholesterol controlled with medications as prescribed, with individualized exercise RX and with personalized nutrition plan. Value goals: LDL < 70mg , HDL > 40 mg.             Education:Diabetes - Individual verbal and written instruction to review signs/symptoms of diabetes, desired ranges of glucose level fasting, after meals and with exercise. Acknowledge that pre and post exercise glucose checks will be done for 3 sessions at  entry of program.   Core Components/Risk Factors/Patient Goals Review:   Goals and Risk Factor Review     Row Name 12/04/23 1712 12/20/23 1624 01/22/24 1735         Core Components/Risk Factors/Patient Goals Review   Personal Goals Review Weight Management/Obesity;Lipids;Hypertension;Diabetes Weight Management/Obesity Hypertension;Lipids;Diabetes     Review THeo has lost 2 pounds in the past month down to 167 lbs. HIs BP is doing well 108-140/72-74 arrival and 100-108/66-72 after session. He had recent blood raw and his LD reduced from 110 to 73, HDL is 55. Trig lowered from 215 to 78 and his HGA1C lowered from 6.8 to 5.3%.  He is chaging his nutriton to include brown rice, no sode, more carbs with his meals and he is monitoring sodium intake. All woking towards his goals of risk factor control and weight loss. Theo is doing well with losing weight, down to 164lbs. He is happy with his progress but wants to make sure the changes he is makin will help him keep the weight off. Spoke with him about long term success and how to work towards more sustainable habits. Anise Kerns is graduating from cardiac rehab today. He reports that he is comfortable chekcing blood sugars and blood pressures at home. He plans to continue monitoring these risk factors independently upon discharge and will notify doctor of any changes or concerns. He continues to follow up with required lab work and take all her medication.     Expected Outcomes STG continue to follow the RD guidelines for nutriton changes, be compliant with meds and continue to 5-6 days of exericse all for risk fctor control and weight loss.  LTG maintain the lifestyle changes after discharge STG: Lose 5% of CBW. LTG: Achieve and maintain a healthy weight Short:  graduate from cardiac rehab. Long: continue to control cardiac risk factors.              Core Components/Risk Factors/Patient Goals at Discharge (Final Review):   Goals and Risk Factor Review -  01/22/24 1735       Core Components/Risk Factors/Patient Goals Review   Personal Goals Review Hypertension;Lipids;Diabetes    Review Anise Kerns is graduating from cardiac rehab today. He reports that he is comfortable chekcing blood sugars and blood pressures at home. He plans to continue monitoring these risk factors independently upon discharge and will notify doctor of any changes or concerns. He continues to follow up with required lab work and take all her medication.    Expected Outcomes Short: graduate from cardiac rehab. Long: continue to control cardiac risk factors.             ITP Comments:  ITP Comments     Row Name 10/11/23 1450 10/18/23 1517 10/23/23 1550 11/08/23 0847 12/06/23 0859   ITP Comments Virtual orientation call completed today. he has an appointment on Date: 10/18/2023  for EP eval and gym Orientation.  Documentation of diagnosis [facility] be found in [facility] Date: 09/09/2023 . Completed and gym orientation. Initial ITP created and sent for review to Dr. Firman Hughes, Medical Director. First full day of exercise!  Patient was oriented to gym and equipment including functions, settings, policies, and procedures.  Patient's individual exercise prescription and treatment plan were reviewed.  All starting workloads were established based on the results of the 6 minute walk test done at initial orientation visit.  The plan for exercise progression was also introduced and progression will be customized based on patient's performance and goals. 30 Day review completed. Medical Director ITP review done, changes made as directed, and signed approval by Medical Director. New Patient 30 Day review completed. Medical Director ITP review done, changes made as directed, and signed approval by Medical Director.    Row Name 01/03/24 1204 01/22/24 1747         ITP Comments 30 Day review completed. Medical Director ITP review done, changes made as directed, and signed approval by Medical Director.  Cordelro graduated today from  rehab with 36 sessions completed.  Details of the patient's exercise prescription and what He needs to do in order to continue the prescription and progress were discussed with patient.  Patient was given a copy of prescription and goals.  Patient verbalized understanding. Kaylem plans to continue to exercise by using his home treadmill, elliptical, bike and rower.               Comments: Discharge ITP

## 2024-01-24 ENCOUNTER — Encounter

## 2024-01-25 ENCOUNTER — Ambulatory Visit (INDEPENDENT_AMBULATORY_CARE_PROVIDER_SITE_OTHER): Admitting: Otolaryngology

## 2024-01-25 ENCOUNTER — Encounter

## 2024-01-25 ENCOUNTER — Encounter (INDEPENDENT_AMBULATORY_CARE_PROVIDER_SITE_OTHER): Payer: Self-pay

## 2024-01-25 VITALS — Ht 66.0 in | Wt 167.0 lb

## 2024-01-25 DIAGNOSIS — R0981 Nasal congestion: Secondary | ICD-10-CM

## 2024-01-25 DIAGNOSIS — J0121 Acute recurrent ethmoidal sinusitis: Secondary | ICD-10-CM | POA: Diagnosis not present

## 2024-01-25 DIAGNOSIS — J0111 Acute recurrent frontal sinusitis: Secondary | ICD-10-CM | POA: Diagnosis not present

## 2024-01-25 DIAGNOSIS — J338 Other polyp of sinus: Secondary | ICD-10-CM

## 2024-01-25 MED ORDER — PREDNISONE 10 MG (48) PO TBPK: ORAL_TABLET | ORAL | 0 refills | Status: DC

## 2024-01-25 MED ORDER — LEVOFLOXACIN 500 MG PO TABS: 500.0000 mg | ORAL_TABLET | Freq: Every day | ORAL | 0 refills | Status: AC

## 2024-01-28 DIAGNOSIS — J0111 Acute recurrent frontal sinusitis: Secondary | ICD-10-CM | POA: Insufficient documentation

## 2024-01-28 DIAGNOSIS — J0121 Acute recurrent ethmoidal sinusitis: Secondary | ICD-10-CM | POA: Insufficient documentation

## 2024-01-28 NOTE — Progress Notes (Signed)
 Patient ID: Brent Oneill, male   DOB: 27-Oct-1970, 53 y.o.   MRN: [id number]  Follow-up: Chronic rhinosinusitis and polyposis New complaint: Left eye swelling   HPI: The patient is a 53 year old male who presents today with a new complaint of left eye swelling and acute sinusitis.  The patient has a history of chronic rhinosinusitis and polyposis.  He underwent endoscopic sinus surgery in October 2022.  At his last visit in October 2024, his sinus openings were patent.  Nasal mucosal congestion was noted.  He was continued on Flonase nasal spray.  According to the patient, he noted an acute onset left eye swelling 2 weeks ago.  He was evaluated by his ophthalmologist.  His MRI scan showed opacification of the left frontal and ethmoid sinuses, with expansion of the left frontal sinus wall, encroaching into the left orbit.  He was treated with antibiotic eyedrops.  The patient was also diagnosed with an acute heart attack since his last visit.  He was treated with stent placement.  Exam: General: Communicates without difficulty, well nourished, no acute distress. Head: Normocephalic, no evidence injury, no tenderness, facial buttresses intact without stepoff. Face/sinus: No tenderness to palpation and percussion. Facial movement is normal and symmetric. Eyes: PERRL, EOMI. No scleral icterus, conjunctivae clear.  The left eye is slightly protruded.  Neuro: CN II exam reveals vision grossly intact.  No nystagmus at any point of gaze. Ears: Auricles well formed without lesions.  Ear canals are intact without mass or lesion.  No erythema or edema is appreciated.  The TMs are intact without fluid. Nose: External evaluation reveals normal support and skin without lesions.  Dorsum is intact.  Anterior rhinoscopy reveals congested mucosa over anterior aspect of inferior turbinates and intact septum.  Oral:  Oral cavity and oropharynx are intact, symmetric, without erythema or edema.  Mucosa is moist without lesions.  Neck: Full range of motion without pain.  There is no significant lymphadenopathy.  No masses palpable.  Thyroid bed within normal limits to palpation.  Parotid glands and submandibular glands equal bilaterally without mass.  Trachea is midline. Neuro:  CN 2-12 grossly intact.   Assessment: 1.  Recurrent left frontal and ethmoid sinusitis, with expansion of the left frontal sinus wall, impinging on the left orbital cavity.  The left eye is slightly protruded.  No extraocular motion impairment. 2.  Diffuse nasal mucosal congestion is noted today.  Plan: 1.  The physical exam findings and the MRI results are extensively reviewed with the patient. 2.  Levofloxacin  500 mg p.o. daily for 14 days. 3.  High-dose prednisone  taper for 12 days.  The possible side effects are discussed. 4.  Sinus CT scan after the antibiotic treatment. 5.  The patient will return for reevaluation in 3 to 4 weeks, sooner if needed. 6.  If he continues to be symptomatic, he may benefit from revision endoscopic sinus surgery to treat the left frontal and ethmoid sinuses.

## 2024-01-29 ENCOUNTER — Encounter

## 2024-02-14 ENCOUNTER — Telehealth (INDEPENDENT_AMBULATORY_CARE_PROVIDER_SITE_OTHER): Payer: Self-pay | Admitting: Otolaryngology

## 2024-02-14 NOTE — Telephone Encounter (Signed)
 Patient called to confirm follow up appointment date and time. Wanted to know if they can get in sooner since the infection is back. Routing message to [employer].

## 2024-02-15 ENCOUNTER — Ambulatory Visit (INDEPENDENT_AMBULATORY_CARE_PROVIDER_SITE_OTHER): Admitting: Otolaryngology

## 2024-02-15 ENCOUNTER — Encounter (INDEPENDENT_AMBULATORY_CARE_PROVIDER_SITE_OTHER): Payer: Self-pay | Admitting: Otolaryngology

## 2024-02-15 VITALS — BP 111/74 | HR 79

## 2024-02-15 DIAGNOSIS — J0111 Acute recurrent frontal sinusitis: Secondary | ICD-10-CM

## 2024-02-15 DIAGNOSIS — J0121 Acute recurrent ethmoidal sinusitis: Secondary | ICD-10-CM | POA: Diagnosis not present

## 2024-02-15 DIAGNOSIS — J338 Other polyp of sinus: Secondary | ICD-10-CM

## 2024-02-15 MED ORDER — LEVOFLOXACIN 500 MG PO TABS: 500.0000 mg | ORAL_TABLET | Freq: Every day | ORAL | 0 refills | Status: AC

## 2024-02-15 MED ORDER — PREDNISONE 10 MG (48) PO TBPK: ORAL_TABLET | ORAL | 0 refills | Status: DC

## 2024-02-15 NOTE — Progress Notes (Signed)
 Patient ID: Brent Oneill, male   DOB: May 22, 1971, 54 y.o.   MRN: [id number]  Follow up: Recurrent left frontal and ethmoid sinusitis, left orbital compression  HPI: The patient is a 53 year old male who returns today complaining of increasing facial pain and left eye swelling.  The patient was last seen 3 weeks ago.  At that time, he was noted to have recurrent acute left frontal and ethmoid sinusitis, with expansion of the left orbital sinus wall, encroaching on the left orbit.  He has resulted in left eye swelling.  The patient was treated with levofloxacin  and high-dose prednisone .  According to the patient, his left eye swelling and facial pain initially improved after the medical treatment.  However, over the past 2 days, he has noted increasing facial pain and left eye swelling.  His left eye vision has also decreased.  He is using Flonase nasal spray daily.  Exam: General: Communicates without difficulty, well nourished, no acute distress. Head: Normocephalic, no evidence injury, no tenderness, facial buttresses intact without stepoff. Face/sinus: No tenderness to palpation and percussion. Facial movement is normal and symmetric. Eyes: PERRL, EOMI. No scleral icterus, conjunctivae clear.  The left eye is slightly protruded.  Neuro: CN II exam reveals vision grossly intact.  No nystagmus at any point of gaze. Ears: Auricles well formed without lesions.  Ear canals are intact without mass or lesion.  No erythema or edema is appreciated.  The TMs are intact without fluid. Nose: External evaluation reveals normal support and skin without lesions.  Dorsum is intact.  Anterior rhinoscopy reveals congested mucosa over anterior aspect of inferior turbinates and intact septum.  Left-sided mucopurulent drainage is noted.  Oral:  Oral cavity and oropharynx are intact, symmetric, without erythema or edema.  Mucosa is moist without lesions. Neck: Full range of motion without pain.  There is no significant  lymphadenopathy.  No masses palpable.  Thyroid bed within normal limits to palpation.  Parotid glands and submandibular glands equal bilaterally without mass.  Trachea is midline. Neuro:  CN 2-12 grossly intact.    Assessment: 1.  Recurrent left frontal and ethmoid sinusitis, with expansion of the left frontal sinus wall, impinging on the left orbital cavity.  The left eye is again protruded.  No extraocular motion impairment.  Mucopurulent drainage is noted from the left nasal cavity. 2.  The patient responded briefly to the last round of antibiotic and steroid treatment.  Plan: 1.  The physical exam findings are reviewed with the patient. 2.  High-dose prednisone  for 12 days.  The possible side effects are again discussed. 3.  Levofloxacin  daily for 14 additional days. 4.  STAT sinus CT scan. 5.  The patient will return for reevaluation in 1 week.  Based on the above findings, the patient will most likely need to undergo revision endoscopic sinus surgery to treat the left frontal and ethmoid sinuses.  The risk, benefits, and details of the procedures are reviewed with the patient.  Questions were invited and answered.

## 2024-02-22 ENCOUNTER — Ambulatory Visit (INDEPENDENT_AMBULATORY_CARE_PROVIDER_SITE_OTHER): Admitting: Otolaryngology

## 2024-02-27 ENCOUNTER — Ambulatory Visit
Admission: RE | Admit: 2024-02-27 | Discharge: 2024-02-27 | Disposition: A | Discharge status: Home / Self Care | Source: Ambulatory Visit | Attending: Otolaryngology | Admitting: Otolaryngology

## 2024-02-27 DIAGNOSIS — J0121 Acute recurrent ethmoidal sinusitis: Secondary | ICD-10-CM | POA: Diagnosis present

## 2024-02-27 DIAGNOSIS — J0111 Acute recurrent frontal sinusitis: Secondary | ICD-10-CM | POA: Insufficient documentation

## 2024-02-27 DIAGNOSIS — J338 Other polyp of sinus: Secondary | ICD-10-CM | POA: Diagnosis present

## 2024-02-29 ENCOUNTER — Encounter (INDEPENDENT_AMBULATORY_CARE_PROVIDER_SITE_OTHER): Payer: Self-pay | Admitting: Otolaryngology

## 2024-02-29 ENCOUNTER — Ambulatory Visit (INDEPENDENT_AMBULATORY_CARE_PROVIDER_SITE_OTHER): Admitting: Otolaryngology

## 2024-02-29 VITALS — HR 80 | Ht 66.0 in | Wt 167.0 lb

## 2024-02-29 DIAGNOSIS — J322 Chronic ethmoidal sinusitis: Secondary | ICD-10-CM

## 2024-02-29 DIAGNOSIS — J321 Chronic frontal sinusitis: Secondary | ICD-10-CM | POA: Diagnosis not present

## 2024-02-29 DIAGNOSIS — J341 Cyst and mucocele of nose and nasal sinus: Secondary | ICD-10-CM | POA: Diagnosis not present

## 2024-03-01 ENCOUNTER — Ambulatory Visit (INDEPENDENT_AMBULATORY_CARE_PROVIDER_SITE_OTHER): Admitting: Otolaryngology

## 2024-03-01 DIAGNOSIS — J341 Cyst and mucocele of nose and nasal sinus: Secondary | ICD-10-CM | POA: Insufficient documentation

## 2024-03-01 DIAGNOSIS — J321 Chronic frontal sinusitis: Secondary | ICD-10-CM | POA: Insufficient documentation

## 2024-03-01 DIAGNOSIS — J322 Chronic ethmoidal sinusitis: Secondary | ICD-10-CM | POA: Insufficient documentation

## 2024-03-01 NOTE — Progress Notes (Signed)
 Patient ID: Brent Oneill, male   DOB: May 29, 1971, 53 y.o.   MRN: [id number]  Follow up: Recurrent left frontal and ethmoid sinusitis, left orbital compression and proptosis  HPI: The patient is a 53 year old male who returns today for his follow-up evaluation.  He was last seen on Feb 15, 2024.  At that time, he was experiencing recurrent left frontal and ethmoid sinusitis, with expansion of the left orbital sinus wall, encroaching on the left orbit, causing left eye proptosis.  He was treated with another course of extended levofloxacin  and high-dose prednisone .  He also underwent a stat sinus CT scan.  The CT showed a likely mucocele, extending from the left posterior ethmoid air cells, through the superior orbital roof onto the posterior aspect of the left frontal sinus.  Opacification of the left anterior ethmoid air cells are also noted.  The patient returns today complaining of persistent left eye proptosis.  His left eye vision is also slightly blurry.  He denies any fever.  Exam: General: Communicates without difficulty, well nourished, no acute distress. Head: Normocephalic, no evidence injury, no tenderness, facial buttresses intact without stepoff. Face/sinus: No tenderness to palpation and percussion. Facial movement is normal and symmetric. Eyes: PERRL, EOMI. No scleral icterus, conjunctivae clear.  The left eye is slightly protruded.  Neuro: CN II exam reveals vision grossly intact.  No nystagmus at any point of gaze. Ears: Auricles well formed without lesions.  Ear canals are intact without mass or lesion.  No erythema or edema is appreciated.  The TMs are intact without fluid. Nose: External evaluation reveals normal support and skin without lesions.  Dorsum is intact.  Anterior rhinoscopy reveals congested mucosa over anterior aspect of inferior turbinates and intact septum.  Left-sided mucopurulent drainage is noted.  Oral:  Oral cavity and oropharynx are intact, symmetric, without erythema or  edema.  Mucosa is moist without lesions. Neck: Full range of motion without pain.  There is no significant lymphadenopathy.  No masses palpable.  Thyroid bed within normal limits to palpation.  Parotid glands and submandibular glands equal bilaterally without mass.  Trachea is midline. Neuro:  CN 2-12 grossly intact.   Assessment: 1.  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 2.  Left eye proptosis, secondary to compression of the left orbit by the expanding mucocele. 3.  The patient continues to be symptomatic despite multiple rounds of extended antibiotics and high-dose steroids.  Plan: 1.  The physical exam findings and the CT images are extensively reviewed with the patient. 2.  Based on the above findings, the patient will benefit from urgent surgical intervention, with endoscopic sinus surgery to treat the left frontal and ethmoid sinuses.  The risk, benefits, alternatives, and details of the procedures are extensively reviewed.  Questions are invited and answered. 3.  The patient would like to proceed with the procedures.  We will schedule the procedures as soon as possible.

## 2024-03-02 ENCOUNTER — Other Ambulatory Visit: Payer: Self-pay | Admitting: Internal Medicine

## 2024-03-02 DIAGNOSIS — E119 Type 2 diabetes mellitus without complications: Secondary | ICD-10-CM

## 2024-03-05 ENCOUNTER — Other Ambulatory Visit (INDEPENDENT_AMBULATORY_CARE_PROVIDER_SITE_OTHER): Payer: Self-pay | Admitting: Otolaryngology

## 2024-03-05 MED ORDER — PREDNISONE 10 MG (48) PO TBPK: ORAL_TABLET | ORAL | 0 refills | Status: DC

## 2024-03-08 ENCOUNTER — Other Ambulatory Visit: Payer: Self-pay

## 2024-03-08 ENCOUNTER — Encounter (HOSPITAL_COMMUNITY): Payer: Self-pay | Admitting: Otolaryngology

## 2024-03-08 NOTE — Progress Notes (Signed)
 SDW CALL  Patient was given pre-op instructions over the phone. The opportunity was given for the patient to ask questions. No further questions asked. Patient verbalized understanding of instructions given.   PCP - Shari Daughters, MD Cardiologist - Alluri, Leo Rainbow, MD    PPM/ICD - denies Device Orders -  Rep Notified -   Chest x-ray - na EKG - 10/04/23 Stress Test - denies ECHO - 10/16/23 Cardiac Cath - 09/11/23  Sleep Study - denies CPAP -   Fasting Blood Sugar -  Checks Blood Sugar _____ times a day  Blood Thinner Instructions: pt states his cardiologist instructed him to hold Aspirin  and Plavix  five days prior to surgery. His last dose of each medicine was on 03/05/2024. Aspirin  Instructions:see above.   ERAS Protcol -clears until 0730 PRE-SURGERY Ensure or G2- no  COVID TEST- na   Anesthesia review: yes hx STEMI 08/2023,PCI of RCA  Patient denies shortness of breath, fever, cough and chest pain over the phone call   WHAT DO I DO ABOUT MY DIABETES MEDICATION?  Do not take oral diabetes medicines (pills) the morning of surgery. Do not take Metformin  (Glucophage ) the day of surgery.    Check your blood sugar the morning of your surgery when you wake up and every 2 hours until you get to the Short Stay unit.  If your blood sugar is less than 70 mg/dL, you will need to treat for low blood sugar: Do not take insulin. Treat a low blood sugar (less than 70 mg/dL) with  cup of clear juice (cranberry or apple), 4 glucose tablets, OR glucose gel. Recheck blood sugar in 15 minutes after treatment (to make sure it is greater than 70 mg/dL). If your blood sugar is not greater than 70 mg/dL on recheck, call [phone] for further instructions. Report your blood sugar to the short stay nurse when you get to Short Stay.    As of today, STOP taking any Aspirin  (unless otherwise instructed by your surgeon) Aleve, Naproxen, Ibuprofen , Motrin , Advil , Goody's, BC's,  all herbal medications, fish oil, and all vitamins.     Special instructions:    Oral Hygiene is also important to reduce your risk of infection.  Remember - BRUSH YOUR TEETH THE MORNING OF SURGERY WITH YOUR REGULAR TOOTHPASTE

## 2024-03-08 NOTE — Anesthesia Preprocedure Evaluation (Addendum)
 Anesthesia Evaluation  Patient identified by MRN, date of birth, ID band Patient awake    Reviewed: Allergy & Precautions, H&P , NPO status , Patient's Chart, lab work & pertinent test results  Airway Mallampati: II  TM Distance: >3 FB Neck ROM: Full    Dental no notable dental hx.    Pulmonary asthma    Pulmonary exam normal breath sounds clear to auscultation       Cardiovascular hypertension, Pt. on medications + CAD, + Past MI and + Cardiac Stents  Normal cardiovascular exam Rhythm:Regular Rate:Normal     Neuro/Psych negative neurological ROS  negative psych ROS   GI/Hepatic negative GI ROS, Neg liver ROS,,,  Endo/Other  negative endocrine ROSdiabetes, Type 2    Renal/GU negative Renal ROS  negative genitourinary   Musculoskeletal negative musculoskeletal ROS (+)    Abdominal   Peds negative pediatric ROS (+)  Hematology negative hematology ROS (+)   Anesthesia Other Findings   Reproductive/Obstetrics negative OB ROS                             Anesthesia Physical Anesthesia Plan  ASA: 3  Anesthesia Plan: General   Post-op Pain Management:    Induction: Intravenous  PONV Risk Score and Plan: 2 and Ondansetron , Midazolam  and Treatment may vary due to age or medical condition  Airway Management Planned: Oral ETT  Additional Equipment:   Intra-op Plan:   Post-operative Plan: Extubation in OR  Informed Consent: I have reviewed the patients History and Physical, chart, labs and discussed the procedure including the risks, benefits and alternatives for the proposed anesthesia with the patient or authorized representative who has indicated his/her understanding and acceptance.     Dental advisory given  Plan Discussed with: CRNA  Anesthesia Plan Comments: (PAT note by Rudy Costain, PA-C:  53 year old male with pertinent history including asthma, HTN, HLD,  non-insulin-dependent DM2.  He was admitted 12/14 through 09/13/2023 after presenting with shortness of breath and chest pain.  He was found to have acute inferior STEMI.  Transferred for emergent cath and found to have significant clot burden in the distal RCA which was treated with DES.  Of note, during catheterization he developed ventricular fibrillation arrest and was treated with defibrillation, IV amiodarone , and started on IV Levophed  infusion as well as tirofiban  infusion.  A relook cath after Aggrastat /heparin  drip showed RCA to be proximally occluded and full of thrombus, mildly depressed LV systolic function with EF 45 to 50%.  It was determined at that time not to proceed with any further intervention and treat medically. At discharge he was asymptomatic, recommended continue uninterrupted DAPT for 6 months and follow-up outpatient with cardiology.  He did complete outpatient cardiac rehabilitation following discharge.  He was last seen in cardiology follow-up by Dr. Bob Burn on 01/23/2024 and noted to be doing well from cardiac standpoint. Repeat echo 10/16/2023 showed EF 55%, grade 1 DD, normal RV systolic function, no significant valvular abnormalities.  He was noted to be exercising on treadmill/elliptical 20 minutes, 4 times a week without any complaints.  It was also discussed that he would be following up with ENT due to recent ophthalmologic evaluation with MRI showing recurrent acute left frontal and ethmoid sinusitis, with expansion of the left orbital sinus wall, encroaching on the left orbit resulting in swelling, pain, and visual disturbance.   Pt reports LD of ASA and Plavix  03/05/24 as instructed by his cardiologist.  He will need day of surgery labs and evaluation.  EKG 10/04/2023 (Care Everywhere): NSR.  Rate 84.  Possible LAE.  T wave abnormality, consider inferior ischemia.  TTE 10/16/2023 (Care Everywhere): CONCLUSION  -------------------------------------------------------------------------------  NORMAL LEFT VENTRICULAR SYSTOLIC FUNCTION WITH MILD LVH  ESTIMATED EF: 55%  NORMAL LA PRESSURES WITH DIASTOLIC DYSFUNCTION (GRADE 1)  NORMAL RIGHT VENTRICULAR SYSTOLIC FUNCTION  VALVULAR REGURGITATION: No AR, TRIVIAL MR, TRIVIAL PR, MILD TR  NO VALVULAR STENOSIS  Cath 09/11/2023:    Prox RCA lesion is 100% stenosed.   Dist RCA lesion is 100% stenosed.   Mid RCA to Dist RCA lesion is 100% stenosed.   There is mild left ventricular systolic dysfunction.   LV end diastolic pressure is normal.   The left ventricular ejection fraction is 45-50% by visual estimate.   Anticipated discharge date to be determined.   Recommend uninterrupted dual antiplatelet therapy with Aspirin  81mg  daily and Clopidogrel  75mg  daily for a minimum of 6 months (stable ischemic heart disease-Class I recommendation).  Conclusion Relook left heart cath 48 hours after complex partially successful STEMI.  Heavy thrombus distal RCA with less than TIMI-3 flow )        Anesthesia Quick Evaluation

## 2024-03-08 NOTE — Progress Notes (Addendum)
 Anesthesia Chart Review: Same-day workup  53 year old male with pertinent history including asthma, HTN, HLD, non-insulin-dependent DM2.  He was admitted 12/14 through 09/13/2023 after presenting with shortness of breath and chest pain.  He was found to have acute inferior STEMI.  Transferred for emergent cath and found to have significant clot burden in the distal RCA which was treated with DES.  Of note, during catheterization he developed ventricular fibrillation arrest and was treated with defibrillation, IV amiodarone , and started on IV Levophed  infusion as well as tirofiban  infusion.  A relook cath after Aggrastat /heparin  drip showed RCA to be proximally occluded and full of thrombus, mildly depressed LV systolic function with EF 45 to 50%.  It was determined at that time not to proceed with any further intervention and treat medically. At discharge he was asymptomatic, recommended continue uninterrupted DAPT for 6 months and follow-up outpatient with cardiology.  He did complete outpatient cardiac rehabilitation following discharge.  He was last seen in cardiology follow-up by Dr. Bob Burn on 01/23/2024 and noted to be doing well from cardiac standpoint. Repeat echo 10/16/2023 showed EF 55%, grade 1 DD, normal RV systolic function, no significant valvular abnormalities.  He was noted to be exercising on treadmill/elliptical 20 minutes, 4 times a week without any complaints.  It was also discussed that he would be following up with ENT due to recent ophthalmologic evaluation with MRI showing recurrent acute left frontal and ethmoid sinusitis, with expansion of the left orbital sinus wall, encroaching on the left orbit resulting in swelling, pain, and visual disturbance.   Pt reports LD of ASA and Plavix  03/05/24 as instructed by his cardiologist.   He will need day of surgery labs and evaluation.  EKG 10/04/2023 (Care Everywhere): NSR.  Rate 84.  Possible LAE.  T wave abnormality, consider inferior  ischemia.  TTE 10/16/2023 (Care Everywhere): CONCLUSION -------------------------------------------------------------------------------  NORMAL LEFT VENTRICULAR SYSTOLIC FUNCTION WITH MILD LVH  ESTIMATED EF: 55%  NORMAL LA PRESSURES WITH DIASTOLIC DYSFUNCTION (GRADE 1)  NORMAL RIGHT VENTRICULAR SYSTOLIC FUNCTION  VALVULAR REGURGITATION: No AR, TRIVIAL MR, TRIVIAL PR, MILD TR  NO VALVULAR STENOSIS  Cath 09/11/2023:    Prox RCA lesion is 100% stenosed.   Dist RCA lesion is 100% stenosed.   Mid RCA to Dist RCA lesion is 100% stenosed.   There is mild left ventricular systolic dysfunction.   LV end diastolic pressure is normal.   The left ventricular ejection fraction is 45-50% by visual estimate.   Anticipated discharge date to be determined.   Recommend uninterrupted dual antiplatelet therapy with Aspirin  81mg  daily and Clopidogrel  75mg  daily for a minimum of 6 months (stable ischemic heart disease-Class I recommendation).   Conclusion Relook left heart cath 48 hours after complex partially successful STEMI.  Heavy thrombus distal RCA with less than TIMI-3 flow    Edilia Gordon [facility]Anesthesiology Phone [phone] 03/08/2024 4:28 PM

## 2024-03-11 ENCOUNTER — Other Ambulatory Visit: Payer: Self-pay

## 2024-03-11 ENCOUNTER — Ambulatory Visit (HOSPITAL_BASED_OUTPATIENT_CLINIC_OR_DEPARTMENT_OTHER): Payer: Self-pay | Admitting: Physician Assistant

## 2024-03-11 ENCOUNTER — Encounter (HOSPITAL_COMMUNITY): Payer: Self-pay | Admitting: Otolaryngology

## 2024-03-11 ENCOUNTER — Ambulatory Visit (HOSPITAL_COMMUNITY)
Admission: RE | Admit: 2024-03-11 | Discharge: 2024-03-11 | Disposition: A | Discharge status: Home / Self Care | Attending: Otolaryngology | Admitting: Otolaryngology

## 2024-03-11 ENCOUNTER — Encounter (HOSPITAL_COMMUNITY)
Admission: RE | Disposition: A | Discharge status: Home / Self Care | Payer: Self-pay | Source: Home / Self Care | Attending: Otolaryngology

## 2024-03-11 ENCOUNTER — Ambulatory Visit (HOSPITAL_COMMUNITY): Payer: Self-pay | Admitting: Physician Assistant

## 2024-03-11 ENCOUNTER — Ambulatory Visit (INDEPENDENT_AMBULATORY_CARE_PROVIDER_SITE_OTHER)

## 2024-03-11 DIAGNOSIS — J321 Chronic frontal sinusitis: Secondary | ICD-10-CM | POA: Insufficient documentation

## 2024-03-11 DIAGNOSIS — I252 Old myocardial infarction: Secondary | ICD-10-CM | POA: Diagnosis not present

## 2024-03-11 DIAGNOSIS — J322 Chronic ethmoidal sinusitis: Secondary | ICD-10-CM | POA: Insufficient documentation

## 2024-03-11 DIAGNOSIS — J341 Cyst and mucocele of nose and nasal sinus: Secondary | ICD-10-CM

## 2024-03-11 DIAGNOSIS — E119 Type 2 diabetes mellitus without complications: Secondary | ICD-10-CM | POA: Insufficient documentation

## 2024-03-11 DIAGNOSIS — I10 Essential (primary) hypertension: Secondary | ICD-10-CM | POA: Diagnosis not present

## 2024-03-11 DIAGNOSIS — I2510 Atherosclerotic heart disease of native coronary artery without angina pectoris: Secondary | ICD-10-CM | POA: Insufficient documentation

## 2024-03-11 DIAGNOSIS — Z955 Presence of coronary angioplasty implant and graft: Secondary | ICD-10-CM | POA: Diagnosis not present

## 2024-03-11 HISTORY — PX: SINUS ENDO W/FUSION: SHX777

## 2024-03-11 HISTORY — DX: Type 2 diabetes mellitus without complications: E11.9

## 2024-03-11 HISTORY — DX: Essential (primary) hypertension: I10

## 2024-03-11 HISTORY — DX: Acute myocardial infarction, unspecified: I21.9

## 2024-03-11 LAB — BASIC METABOLIC PANEL WITH GFR
Anion gap: 8 (ref 5–15)
BUN: 11 mg/dL (ref 6–20)
CO2: 26 mmol/L (ref 22–32)
Calcium: 9.2 mg/dL (ref 8.9–10.3)
Chloride: 105 mmol/L (ref 98–111)
Creatinine, Ser: 0.94 mg/dL (ref 0.61–1.24)
GFR, Estimated: >60 mL/min (ref >60)
Glucose, Bld: 89 mg/dL (ref 70–99)
Potassium: 3.8 mmol/L (ref 3.5–5.1)
Sodium: 139 mmol/L (ref 135–145)

## 2024-03-11 LAB — CBC
HCT: 41.6 % (ref 39.0–52.0)
Hemoglobin: 13.6 g/dL (ref 13.0–17.0)
MCH: 26.7 pg (ref 26.0–34.0)
MCHC: 32.7 g/dL (ref 30.0–36.0)
MCV: 81.6 fL (ref 80.0–100.0)
Platelets: 181 10*3/uL (ref 150–400)
RBC: 5.1 MIL/uL (ref 4.22–5.81)
RDW: 12.9 % (ref 11.5–15.5)
WBC: 8.6 10*3/uL (ref 4.0–10.5)
nRBC: 0 % (ref 0.0–0.2)

## 2024-03-11 LAB — GLUCOSE, CAPILLARY
Glucose-Capillary: 91 mg/dL (ref 70–99)
Glucose-Capillary: 129 mg/dL — ABNORMAL HIGH (ref 70–99)

## 2024-03-11 SURGERY — SINUS SURGERY, ENDOSCOPIC, USING COMPUTER-ASSISTED NAVIGATION
Anesthesia: General | Laterality: Left

## 2024-03-11 MED ORDER — ONDANSETRON HCL 4 MG/2ML IJ SOLN
INTRAMUSCULAR | Status: DC | PRN
  Administered 2024-03-11: 4 mg via INTRAVENOUS

## 2024-03-11 MED ORDER — ONDANSETRON HCL 4 MG/2ML IJ SOLN
INTRAMUSCULAR | Status: AC
  Filled 2024-03-11: qty 2

## 2024-03-11 MED ORDER — LIDOCAINE 2% (20 MG/ML) 5 ML SYRINGE
INTRAMUSCULAR | Status: DC | PRN
  Administered 2024-03-11: 80 mg via INTRAVENOUS

## 2024-03-11 MED ORDER — SODIUM CHLORIDE 0.9 % IR SOLN
Status: DC | PRN
Start: 2024-03-11 — End: 2024-03-11
  Administered 2024-03-11: 250 mL

## 2024-03-11 MED ORDER — ESMOLOL HCL 100 MG/10ML IV SOLN
INTRAVENOUS | Status: DC | PRN
Start: 2024-03-11 — End: 2024-03-11
  Administered 2024-03-11: 30 mg via INTRAVENOUS

## 2024-03-11 MED ORDER — PROPOFOL 10 MG/ML IV BOLUS
INTRAVENOUS | Status: DC | PRN
  Administered 2024-03-11: 150 mg via INTRAVENOUS

## 2024-03-11 MED ORDER — MEPERIDINE HCL 25 MG/ML IJ SOLN: 6.2500 mg | INTRAMUSCULAR | Status: DC | PRN

## 2024-03-11 MED ORDER — MIDAZOLAM HCL 2 MG/2ML IJ SOLN
INTRAMUSCULAR | Status: AC
  Filled 2024-03-11: qty 2

## 2024-03-11 MED ORDER — ROCURONIUM BROMIDE 10 MG/ML (PF) SYRINGE
PREFILLED_SYRINGE | INTRAVENOUS | Status: DC | PRN
  Administered 2024-03-11: 60 mg via INTRAVENOUS
  Administered 2024-03-11: 10 mg via INTRAVENOUS

## 2024-03-11 MED ORDER — SUGAMMADEX SODIUM 200 MG/2ML IV SOLN
INTRAVENOUS | Status: DC | PRN
  Administered 2024-03-11: 157.8 mg via INTRAVENOUS

## 2024-03-11 MED ORDER — OXYMETAZOLINE HCL 0.05 % NA SOLN
NASAL | Status: AC
  Filled 2024-03-11: qty 30

## 2024-03-11 MED ORDER — LACTATED RINGERS IV SOLN: INTRAVENOUS | Status: DC

## 2024-03-11 MED ORDER — OXYMETAZOLINE HCL 0.05 % NA SOLN
NASAL | Status: DC | PRN
  Administered 2024-03-11: 1

## 2024-03-11 MED ORDER — NITROGLYCERIN IN D5W 200-5 MCG/ML-% IV SOLN
INTRAVENOUS | Status: AC
  Filled 2024-03-11: qty 250

## 2024-03-11 MED ORDER — DEXAMETHASONE SODIUM PHOSPHATE 10 MG/ML IJ SOLN
INTRAMUSCULAR | Status: DC | PRN
  Administered 2024-03-11: 10 mg via INTRAVENOUS

## 2024-03-11 MED ORDER — CEFAZOLIN SODIUM-DEXTROSE 2-3 GM-%(50ML) IV SOLR
INTRAVENOUS | Status: DC | PRN
  Administered 2024-03-11: 2 g via INTRAVENOUS

## 2024-03-11 MED ORDER — MUPIROCIN 2 % EX OINT
TOPICAL_OINTMENT | CUTANEOUS | Status: AC
  Filled 2024-03-11: qty 22

## 2024-03-11 MED ORDER — FENTANYL CITRATE (PF) 250 MCG/5ML IJ SOLN
INTRAMUSCULAR | Status: AC
  Filled 2024-03-11: qty 5

## 2024-03-11 MED ORDER — MIDAZOLAM HCL 2 MG/2ML IJ SOLN
INTRAMUSCULAR | Status: DC | PRN
  Administered 2024-03-11: 2 mg via INTRAVENOUS

## 2024-03-11 MED ORDER — LABETALOL HCL 5 MG/ML IV SOLN
INTRAVENOUS | Status: AC
  Filled 2024-03-11: qty 4

## 2024-03-11 MED ORDER — AMISULPRIDE (ANTIEMETIC) 5 MG/2ML IV SOLN: 10.0000 mg | Freq: Once | INTRAVENOUS | Status: DC | PRN

## 2024-03-11 MED ORDER — PHENYLEPHRINE HCL-NACL 20-0.9 MG/250ML-% IV SOLN
INTRAVENOUS | Status: DC | PRN
  Administered 2024-03-11: 30 ug/min via INTRAVENOUS

## 2024-03-11 MED ORDER — INSULIN ASPART 100 UNIT/ML IJ SOLN: 0.0000 [IU] | INTRAMUSCULAR | Status: DC | PRN

## 2024-03-11 MED ORDER — CHLORHEXIDINE GLUCONATE 0.12 % MT SOLN
15.0000 mL | Freq: Once | OROMUCOSAL | Status: AC
  Administered 2024-03-11: 15 mL via OROMUCOSAL
  Filled 2024-03-11: qty 15

## 2024-03-11 MED ORDER — FENTANYL CITRATE (PF) 250 MCG/5ML IJ SOLN
INTRAMUSCULAR | Status: DC | PRN
  Administered 2024-03-11 (×3): 50 ug via INTRAVENOUS

## 2024-03-11 MED ORDER — PHENYLEPHRINE 80 MCG/ML (10ML) SYRINGE FOR IV PUSH (FOR BLOOD PRESSURE SUPPORT)
PREFILLED_SYRINGE | INTRAVENOUS | Status: AC
  Filled 2024-03-11: qty 10

## 2024-03-11 MED ORDER — PROPOFOL 10 MG/ML IV BOLUS
INTRAVENOUS | Status: AC
  Filled 2024-03-11: qty 20

## 2024-03-11 MED ORDER — SODIUM CHLORIDE 0.9 % IV SOLN: 12.5000 mg | INTRAVENOUS | Status: DC | PRN

## 2024-03-11 MED ORDER — LIDOCAINE-EPINEPHRINE 1 %-1:100000 IJ SOLN
INTRAMUSCULAR | Status: AC
  Filled 2024-03-11: qty 1

## 2024-03-11 MED ORDER — LEVOFLOXACIN 500 MG PO TABS: 500.0000 mg | ORAL_TABLET | Freq: Every day | ORAL | 0 refills | Status: AC

## 2024-03-11 MED ORDER — METOCLOPRAMIDE HCL 5 MG/ML IJ SOLN
INTRAMUSCULAR | Status: DC | PRN
  Administered 2024-03-11: 10 mg via INTRAVENOUS

## 2024-03-11 MED ORDER — METOCLOPRAMIDE HCL 5 MG/ML IJ SOLN
INTRAMUSCULAR | Status: AC
  Filled 2024-03-11: qty 2

## 2024-03-11 MED ORDER — PHENYLEPHRINE 80 MCG/ML (10ML) SYRINGE FOR IV PUSH (FOR BLOOD PRESSURE SUPPORT)
PREFILLED_SYRINGE | INTRAVENOUS | Status: DC | PRN
  Administered 2024-03-11 (×5): 80 ug via INTRAVENOUS

## 2024-03-11 MED ORDER — ORAL CARE MOUTH RINSE: 15.0000 mL | Freq: Once | OROMUCOSAL | Status: AC

## 2024-03-11 MED ORDER — MUPIROCIN 2 % EX OINT
TOPICAL_OINTMENT | CUTANEOUS | Status: DC | PRN
  Administered 2024-03-11: 1 via NASAL

## 2024-03-11 MED ORDER — HYDROMORPHONE HCL 1 MG/ML IJ SOLN: 0.2500 mg | INTRAMUSCULAR | Status: DC | PRN

## 2024-03-11 MED ORDER — LABETALOL HCL 5 MG/ML IV SOLN
INTRAVENOUS | Status: DC | PRN
Start: 2024-03-11 — End: 2024-03-11
  Administered 2024-03-11: 20 mg via INTRAVENOUS

## 2024-03-11 MED ORDER — LIDOCAINE 2% (20 MG/ML) 5 ML SYRINGE
INTRAMUSCULAR | Status: AC
  Filled 2024-03-11: qty 5

## 2024-03-11 MED ORDER — DEXAMETHASONE SODIUM PHOSPHATE 10 MG/ML IJ SOLN
INTRAMUSCULAR | Status: AC
Start: 2024-03-11 — End: 2024-03-11
  Filled 2024-03-11: qty 1

## 2024-03-11 MED ORDER — OXYCODONE HCL 5 MG PO TABS: 5.0000 mg | ORAL_TABLET | Freq: Once | ORAL | Status: DC | PRN

## 2024-03-11 MED ORDER — ROCURONIUM BROMIDE 10 MG/ML (PF) SYRINGE
PREFILLED_SYRINGE | INTRAVENOUS | Status: AC
  Filled 2024-03-11: qty 10

## 2024-03-11 MED ORDER — OXYCODONE HCL 5 MG/5ML PO SOLN: 5.0000 mg | Freq: Once | ORAL | Status: DC | PRN

## 2024-03-11 MED ORDER — PROPOFOL 1000 MG/100ML IV EMUL
INTRAVENOUS | Status: AC
  Filled 2024-03-11: qty 100

## 2024-03-11 SURGICAL SUPPLY — 46 items
ATTRACTOMAT 16X20 MAGNETIC DRP (DRAPES) IMPLANT
BAG COUNTER SPONGE SURGICOUNT (BAG) ×1 IMPLANT
BLADE ROTATE RAD 40 4 M4 (BLADE) IMPLANT
BLADE ROTATE TRICUT 4X13 M4 (BLADE) ×1 IMPLANT
BLADE SURG 15 STRL LF DISP TIS (BLADE) IMPLANT
CANISTER SUCTION 3000ML PPV (SUCTIONS) ×2 IMPLANT
COAGULATOR SUCT SWTCH 10FR 6 (ELECTROSURGICAL) ×1 IMPLANT
DRAPE HALF SHEET 40X57 (DRAPES) IMPLANT
DRESSING NASAL KENNEDY 3.5X.9 (MISCELLANEOUS) IMPLANT
DRSG NASOPORE 8CM (GAUZE/BANDAGES/DRESSINGS) IMPLANT
ELECT COATED BLADE 2.86 ST (ELECTRODE) IMPLANT
ELECTRODE REM PT RTRN 9FT ADLT (ELECTROSURGICAL) ×1 IMPLANT
FILTER ARTHROSCOPY CONVERTOR (FILTER) IMPLANT
GLOVE ECLIPSE 7.5 STRL STRAW (GLOVE) ×1 IMPLANT
GOWN STRL REUS W/ TWL LRG LVL3 (GOWN DISPOSABLE) ×2 IMPLANT
KIT BASIN OR (CUSTOM PROCEDURE TRAY) ×1 IMPLANT
KIT TURNOVER KIT B (KITS) ×1 IMPLANT
NDL 18GX1X1/2 (RX/OR ONLY) (NEEDLE) IMPLANT
NDL HYPO 25GX1X1/2 BEV (NEEDLE) IMPLANT
NEEDLE 18GX1X1/2 (RX/OR ONLY) (NEEDLE) IMPLANT
NEEDLE HYPO 25GX1X1/2 BEV (NEEDLE) IMPLANT
NS IRRIG 1000ML POUR BTL (IV SOLUTION) ×1 IMPLANT
PAD ARMBOARD POSITIONER FOAM (MISCELLANEOUS) ×2 IMPLANT
PAD ENT ADHESIVE 25PK (MISCELLANEOUS) ×1 IMPLANT
PENCIL SMOKE EVACUATOR (MISCELLANEOUS) IMPLANT
SPECIMEN JAR SMALL (MISCELLANEOUS) ×2 IMPLANT
SPIKE FLUID TRANSFER (MISCELLANEOUS) ×1 IMPLANT
SPONGE NEURO XRAY DETECT 1X3 (DISPOSABLE) ×1 IMPLANT
SUT CHROMIC 4 0 S 4 (SUTURE) ×1 IMPLANT
SUT ETHILON 3 0 FSL (SUTURE) IMPLANT
SUT PLAIN 4 0 ~~LOC~~ 1 (SUTURE) IMPLANT
SUT PROLENE 2 0 FS (SUTURE) ×1 IMPLANT
SWAB COLLECTION DEVICE MRSA (MISCELLANEOUS) IMPLANT
SWAB CULTURE ESWAB REG 1ML (MISCELLANEOUS) IMPLANT
SYR 5ML LL (SYRINGE) IMPLANT
SYR CONTROL 10ML LL (SYRINGE) IMPLANT
TOWEL GREEN STERILE FF (TOWEL DISPOSABLE) ×1 IMPLANT
TRACKER ENT INSTRUMENT (MISCELLANEOUS) ×1 IMPLANT
TRACKER ENT PATIENT (MISCELLANEOUS) ×1 IMPLANT
TRAP SPECIMEN MUCUS 40CC (MISCELLANEOUS) IMPLANT
TRAY ENT MC OR (CUSTOM PROCEDURE TRAY) ×1 IMPLANT
TUBE CONNECTING 12X1/4 (SUCTIONS) ×1 IMPLANT
TUBING EXTENTION W/L.L. (IV SETS) IMPLANT
TUBING STRAIGHTSHOT EPS 5PK (TUBING) ×1 IMPLANT
WATER STERILE IRR 1000ML POUR (IV SOLUTION) ×1 IMPLANT
WIPE INSTRUMENT VISIWIPE 73X73 (MISCELLANEOUS) ×1 IMPLANT

## 2024-03-11 NOTE — Transfer of Care (Signed)
 Immediate Anesthesia Transfer of Care Note  Patient: Brent Oneill  Procedure(s) Performed: SINUS SURGERY, ENDOSCOPIC, USING COMPUTER-ASSISTED NAVIGATION (Left)  Patient Location: PACU  Anesthesia Type:General  Level of Consciousness: drowsy, patient cooperative, and responds to stimulation  Airway & Oxygen Therapy: Patient Spontanous Breathing and Patient connected to face mask oxygen  Post-op Assessment: Report given to RN, Post -op Vital signs reviewed and stable, Patient moving all extremities X 4, and Patient able to stick tongue midline  Post vital signs: Reviewed and stable  Last Vitals:  Vitals Value Taken Time  BP 125/81 03/11/24 12:00  Temp 97.5   Pulse 78 03/11/24 12:02  Resp 13 03/11/24 12:02  SpO2 96 % 03/11/24 12:02  Vitals shown include unfiled device data.  Last Pain:  Vitals:   03/11/24 0836  TempSrc:   PainSc: 0-No pain      Patients Stated Pain Goal: 0 (03/11/24 0836)  Complications: No notable events documented.

## 2024-03-11 NOTE — Progress Notes (Signed)
 Today's EKG reviewed by Dr. Prescott Brodie.

## 2024-03-11 NOTE — Anesthesia Procedure Notes (Signed)
 Arterial Line Insertion Start/End6/16/2025 9:40 AM, 03/11/2024 9:45 AM Performed by: Alys Julian, CRNA, CRNA  Patient location: Pre-op. Preanesthetic checklist: patient identified, IV checked, site marked, risks and benefits discussed, surgical consent, monitors and equipment checked, pre-op evaluation, timeout performed and anesthesia consent Lidocaine  1% used for infiltration Left, radial was placed Catheter size: 20 G Hand hygiene performed  and maximum sterile barriers used  Allen's test indicative of satisfactory collateral circulation Attempts: 1 Procedure performed using ultrasound guided technique. Ultrasound Notes:needle tip was noted to be adjacent to the nerve/plexus identified Following insertion, dressing applied and Biopatch. Post procedure assessment: normal and unchanged  Patient tolerated the procedure well with no immediate complications.

## 2024-03-11 NOTE — Discharge Instructions (Signed)

## 2024-03-11 NOTE — Op Note (Signed)
 DATE OF PROCEDURE: 03/11/2024  OPERATIVE REPORT   SURGEON: Reynold Caves, MD   PREOPERATIVE DIAGNOSES:  1.  Chronic left ethmoid and frontal sinusitis 2.  Left ethmoid and frontal mucocele  POSTOPERATIVE DIAGNOSES:  1.  Chronic left ethmoid and frontal sinusitis 2.  Left ethmoid and frontal mucocele  PROCEDURE PERFORMED:  1.  Left endoscopic total ethmoidectomy and frontal sinusotomy with mucocele/tissue removal. 2.  FUSION stereotactic image guidance  ANESTHESIA: General endotracheal tube anesthesia.   COMPLICATIONS: None.   ESTIMATED BLOOD LOSS: 100 mL.   INDICATION FOR PROCEDURE: Brent Oneill is a 53 y.o. male with a history of chronic left ethmoid and frontal sinusitis, with expansion of the left orbital sinus wall, encroaching on the left orbit, causing left eye proptosis. He was treated with multiple courses of extended levofloxacin  and high-dose prednisone . He also underwent a stat sinus CT scan. The CT showed a likely mucocele, extending from the left posterior ethmoid air cells, through the superior orbital roof onto the posterior aspect of the left frontal sinus. Opacification of the left anterior ethmoid air cells were also noted. Based on the above findings, the decision was made for the patient to undergo the above-stated procedures. The risks, benefits, alternatives, and details of the procedures were discussed with the patient. Questions were invited and answered. Informed consent was obtained.   DESCRIPTION OF PROCEDURE: The patient was taken to the operating room and placed supine on the operating table. General endotracheal tube anesthesia was administered by the anesthesiologist. The patient was positioned, and prepped and draped in the standard fashion for nasal surgery. Pledgets soaked with Afrin were placed in both nasal cavities for decongestion. The pledgets were subsequently removed. The FUSION stereotactic image guidance marker was placed. The image guidance system was  functional throughout the case.  Using a 0 endoscope, the left nasal cavity was examined.  The left ethmoid sinus was noted to be severely calcified.  The heavily calcified bony partitions of the anterior and posterior ethmoid cavities were taken down.  Mucopurulent drainage was noted within the ethmoid sinuses.  Polypoid tissue was noted and removed.  A mucocele was noted to extend from the posterior ethmoid air cell onto the superior orbital roof and the posterior aspect of the frontal sinus.  The mucocele was opened and marsupialized.  A large amount of mucoid drainage was suctioned.  Attention was then focused on the frontal sinus. The frontal recess was identified and enlarged by removing the surrounding bony partitions. Polypoid tissue was removed from the frontal recess.  The frontal ethmoid sinuses were copiously irrigated.  The care of the patient was turned over to the anesthesiologist. The patient was awakened from anesthesia without difficulty. The patient was extubated and transferred to the recovery room in good condition.   OPERATIVE FINDINGS: Chronic left ethmoid and frontal sinusitis.  The left ethmoid sinus was heavily calcified.  A large mucocele was noted to extend from the posterior ethmoid cavity onto the superior orbital roof and the posterior aspect of the frontal sinus.  SPECIMEN: Left sided sinus content.   FOLLOWUP CARE: The patient be discharged home once he is awake and alert. The patient will follow up in my office in 2 days for sinus debridement.  Brent Oyen Max [place], MD

## 2024-03-11 NOTE — H&P (Signed)
 Cc: Recurrent left frontal and ethmoid sinusitis, left orbital compression and proptosis   HPI: The patient is a 53 year old male who returns today for his follow-up evaluation.  He was last seen on Feb 15, 2024.  At that time, he was experiencing recurrent left frontal and ethmoid sinusitis, with expansion of the left orbital sinus wall, encroaching on the left orbit, causing left eye proptosis.  He was treated with another course of extended levofloxacin  and high-dose prednisone .  He also underwent a stat sinus CT scan.  The CT showed a likely mucocele, extending from the left posterior ethmoid air cells, through the superior orbital roof onto the posterior aspect of the left frontal sinus.  Opacification of the left anterior ethmoid air cells are also noted.  The patient returns today complaining of persistent left eye proptosis.  His left eye vision is also slightly blurry.  He denies any fever.   Exam: General: Communicates without difficulty, well nourished, no acute distress. Head: Normocephalic, no evidence injury, no tenderness, facial buttresses intact without stepoff. Face/sinus: No tenderness to palpation and percussion. Facial movement is normal and symmetric. Eyes: PERRL, EOMI. No scleral icterus, conjunctivae clear.  The left eye is slightly protruded.  Neuro: CN II exam reveals vision grossly intact.  No nystagmus at any point of gaze. Ears: Auricles well formed without lesions.  Ear canals are intact without mass or lesion.  No erythema or edema is appreciated.  The TMs are intact without fluid. Nose: External evaluation reveals normal support and skin without lesions.  Dorsum is intact.  Anterior rhinoscopy reveals congested mucosa over anterior aspect of inferior turbinates and intact septum.  Left-sided mucopurulent drainage is noted.  Oral:  Oral cavity and oropharynx are intact, symmetric, without erythema or edema.  Mucosa is moist without lesions. Neck: Full range of motion without  pain.  There is no significant lymphadenopathy.  No masses palpable.  Thyroid bed within normal limits to palpation.  Parotid glands and submandibular glands equal bilaterally without mass.  Trachea is midline. Neuro:  CN 2-12 grossly intact.    Assessment: 1.  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 2.  Left eye proptosis, secondary to compression of the left orbit by the expanding mucocele. 3.  The patient continues to be symptomatic despite multiple rounds of extended antibiotics and high-dose steroids.   Plan: 1.  The physical exam findings and the CT images are extensively reviewed with the patient. 2.  Based on the above findings, the patient will benefit from urgent surgical intervention, with endoscopic sinus surgery to treat the left frontal and ethmoid sinuses.  The risk, benefits, alternatives, and details of the procedures are extensively reviewed.  Questions are invited and answered. 3.  The patient would like to proceed with the procedures.

## 2024-03-11 NOTE — Anesthesia Procedure Notes (Signed)
 Procedure Name: Intubation Date/Time: 03/11/2024 10:35 AM  Performed by: Alys Julian, CRNAPre-anesthesia Checklist: Patient identified, Emergency Drugs available, Suction available and Patient being monitored Patient Re-evaluated:Patient Re-evaluated prior to induction Oxygen Delivery Method: Circle system utilized Preoxygenation: Pre-oxygenation with 100% oxygen Induction Type: IV induction Ventilation: Mask ventilation without difficulty Laryngoscope Size: Miller and 2 Grade View: Grade II Tube type: Oral Number of attempts: 1 Airway Equipment and Method: Stylet and Bite block Placement Confirmation: ETT inserted through vocal cords under direct vision, positive ETCO2 and breath sounds checked- equal and bilateral Secured at: 23 cm Tube secured with: Tape Dental Injury: Teeth and Oropharynx as per pre-operative assessment

## 2024-03-12 ENCOUNTER — Encounter (HOSPITAL_COMMUNITY): Payer: Self-pay | Admitting: Otolaryngology

## 2024-03-12 LAB — SURGICAL PATHOLOGY

## 2024-03-12 NOTE — Anesthesia Postprocedure Evaluation (Signed)
 Anesthesia Post Note  Patient: Brent Oneill  Procedure(s) Performed: SINUS SURGERY, ENDOSCOPIC, USING COMPUTER-ASSISTED NAVIGATION (Left)     Patient location during evaluation: PACU Anesthesia Type: General Level of consciousness: awake and alert Pain management: pain level controlled Vital Signs Assessment: post-procedure vital signs reviewed and stable Respiratory status: spontaneous breathing, nonlabored ventilation and respiratory function stable Cardiovascular status: blood pressure returned to baseline and stable Postop Assessment: no apparent nausea or vomiting Anesthetic complications: no   No notable events documented.  Last Vitals:  Vitals:   03/11/24 1253 03/11/24 1254  BP: 123/82   Pulse: 76 69  Resp: 13 10  Temp:    SpO2: 94% 96%    Last Pain:  Vitals:   03/11/24 1230  TempSrc:   PainSc: 0-No pain                 Earvin Goldberg

## 2024-03-13 ENCOUNTER — Ambulatory Visit (INDEPENDENT_AMBULATORY_CARE_PROVIDER_SITE_OTHER): Admitting: Otolaryngology

## 2024-03-13 ENCOUNTER — Encounter (INDEPENDENT_AMBULATORY_CARE_PROVIDER_SITE_OTHER): Payer: Self-pay | Admitting: Otolaryngology

## 2024-03-13 VITALS — BP 120/80 | HR 78

## 2024-03-13 DIAGNOSIS — J321 Chronic frontal sinusitis: Secondary | ICD-10-CM | POA: Diagnosis not present

## 2024-03-13 DIAGNOSIS — J322 Chronic ethmoidal sinusitis: Secondary | ICD-10-CM | POA: Diagnosis not present

## 2024-03-13 DIAGNOSIS — J338 Other polyp of sinus: Secondary | ICD-10-CM

## 2024-03-14 NOTE — Progress Notes (Signed)
 Patient ID: Brent Oneill, male   DOB: 1971-03-20, 53 y.o.   MRN: [id number]  Procedure: Left nasal/sinus debridement status post endoscopic sinus surgery.  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  The mucocele was compressing on his left orbit, causing left eye proptosis and blurry vision.  The patient returns today reporting significant improvement in his left eye vision.  The left eye pressure has resolved.  However, he has noted significant left nasal crusting and congestion.  He had a moderate amount of bleeding from the left nasal cavity.  Currently the patient denies any facial pain, fever, or visual change.  Anesthesia: Topical Xylocaine  and Neo-Synephrine.  Description: The patient is placed upright in the exam chair.  The left nasal cavity is sprayed with topical Xylocaine  and Neo-Synephrine.  A 0 rigid endoscope is used for the examination. The scope is advanced past the left nostril into the right nasal cavity. A significant amount of crusting and blood clots are noted within the left nasal cavity and the ethmoid cavities.  The crusting and blood clots are removed with suction catheters and alligator forceps, which are inserted in parallel with the rigid endoscope.  After the debridement procedure, the ethmoid cavities are noted to be widely patent. The left frontal recess is also open.  The patient tolerated the procedure well.  Follow up care: The patient is instructed to perform daily nasal saline irrigation.  The patient will return for re-evaluation in approximately 3 weeks.

## 2024-03-15 ENCOUNTER — Ambulatory Visit: Admitting: Internal Medicine

## 2024-03-20 ENCOUNTER — Encounter (INDEPENDENT_AMBULATORY_CARE_PROVIDER_SITE_OTHER): Admitting: Otolaryngology

## 2024-04-05 ENCOUNTER — Ambulatory Visit: Admitting: Internal Medicine

## 2024-04-11 ENCOUNTER — Encounter (INDEPENDENT_AMBULATORY_CARE_PROVIDER_SITE_OTHER): Payer: Self-pay | Admitting: Otolaryngology

## 2024-04-11 ENCOUNTER — Ambulatory Visit (INDEPENDENT_AMBULATORY_CARE_PROVIDER_SITE_OTHER): Admitting: Otolaryngology

## 2024-04-11 VITALS — BP 130/81 | HR 78

## 2024-04-11 DIAGNOSIS — J341 Cyst and mucocele of nose and nasal sinus: Secondary | ICD-10-CM

## 2024-04-11 DIAGNOSIS — J338 Other polyp of sinus: Secondary | ICD-10-CM

## 2024-04-11 DIAGNOSIS — J322 Chronic ethmoidal sinusitis: Secondary | ICD-10-CM | POA: Diagnosis not present

## 2024-04-11 DIAGNOSIS — J321 Chronic frontal sinusitis: Secondary | ICD-10-CM

## 2024-04-11 NOTE — Progress Notes (Signed)
 Patient ID: Keller Bounds, male   DOB: 1971/06/23, 53 y.o.   MRN: [id number]  Procedure: Left nasal/sinus debridement status post endoscopic sinus surgery.   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  The mucocele was compressing on his left orbit, causing left eye proptosis and blurry vision.  The patient returns today reporting normalization of his left eye vision.  The left eye pressure has resolved.  He still has a moderate amount of left nasal crusting and congestion.  He had no significant bleeding from the left nasal cavity.  Currently the patient denies any facial pain, fever, or visual change.   Anesthesia: Topical Xylocaine  and Neo-Synephrine.   Description: The patient is placed upright in the exam chair.  The left nasal cavity is sprayed with topical Xylocaine  and Neo-Synephrine.  A 0 rigid endoscope is used for the examination. The scope is advanced past the left nostril into the right nasal cavity. A moderate amount of crusting is noted within the left nasal cavity and the ethmoid cavities.  The crusting is removed with suction catheters and alligator forceps, which are inserted in parallel with the rigid endoscope.  After the debridement procedure, the ethmoid cavities are noted to be widely patent. The left frontal recess is also open.  The patient tolerated the procedure well.   Follow up care: The patient is instructed to continue daily nasal saline irrigation.  The patient will return for re-evaluation in approximately 3 weeks.

## 2024-04-22 ENCOUNTER — Other Ambulatory Visit

## 2024-04-22 DIAGNOSIS — E119 Type 2 diabetes mellitus without complications: Secondary | ICD-10-CM

## 2024-04-22 DIAGNOSIS — N138 Other obstructive and reflux uropathy: Secondary | ICD-10-CM

## 2024-04-22 DIAGNOSIS — R972 Elevated prostate specific antigen [PSA]: Secondary | ICD-10-CM

## 2024-04-23 LAB — HEMOGLOBIN A1C
Est. average glucose Bld gHb Est-mCnc: 117 mg/dL
Hgb A1c MFr Bld: 5.7 % — ABNORMAL HIGH (ref 4.8–5.6)

## 2024-04-23 LAB — LIPID PANEL
Chol/HDL Ratio: 2.8 ratio (ref 0.0–5.0)
Cholesterol, Total: 108 mg/dL (ref 100–199)
HDL: 39 mg/dL — ABNORMAL LOW (ref >39)
LDL Chol Calc (NIH): 53 mg/dL (ref 0–99)
Triglycerides: 75 mg/dL (ref 0–149)
VLDL Cholesterol Cal: 16 mg/dL (ref 5–40)

## 2024-04-24 ENCOUNTER — Ambulatory Visit (INDEPENDENT_AMBULATORY_CARE_PROVIDER_SITE_OTHER): Admitting: Internal Medicine

## 2024-04-24 ENCOUNTER — Ambulatory Visit: Payer: Self-pay | Admitting: Internal Medicine

## 2024-04-24 ENCOUNTER — Encounter: Payer: Self-pay | Admitting: Internal Medicine

## 2024-04-24 VITALS — BP 116/83 | HR 83 | Temp 97.2°F | Ht 66.0 in | Wt 170.8 lb

## 2024-04-24 DIAGNOSIS — E7800 Pure hypercholesterolemia, unspecified: Secondary | ICD-10-CM | POA: Diagnosis not present

## 2024-04-24 DIAGNOSIS — E119 Type 2 diabetes mellitus without complications: Secondary | ICD-10-CM | POA: Diagnosis not present

## 2024-04-24 DIAGNOSIS — I10 Essential (primary) hypertension: Secondary | ICD-10-CM

## 2024-04-24 LAB — POCT CBG (FASTING - GLUCOSE)-MANUAL ENTRY: Glucose Fasting, POC: 146 mg/dL — AB (ref 70–99)

## 2024-04-24 LAB — POC CREATINE & ALBUMIN,URINE
Albumin/Creatinine Ratio, Urine, POC: <30
Creatinine, POC: 50 mg/dL
Microalbumin Ur, POC: 10 mg/L

## 2024-04-24 NOTE — Progress Notes (Signed)
 Established Patient Office Visit  Subjective:  Patient ID: Brent Oneill, male    DOB: June 08, 1971  Age: 53 y.o. MRN: [id number]  Chief Complaint  Patient presents with   Follow-up    3 month lab results    No new complaints, here for lab review and medication refills. Labs reviewed and notable for well controlled diabetes, A1c higher but still at target, lipids also at target. Denies any hypoglycemic episodes and home bg readings have been at target. Recently underwent sinus surgery.      No other concerns at this time.   Past Medical History:  Diagnosis Date   Allergic rhinitis 03/14/2016   Asthma, persistent 03/14/2016   Diabetes mellitus without complication (HCC)    Hypertension    Myocardial infarction [facility])     Past Surgical History:  Procedure Laterality Date   CORONARY ANGIOPLASTY     CORONARY/GRAFT ACUTE MI REVASCULARIZATION N/A 09/09/2023   Procedure: Coronary/Graft Acute MI Revascularization;  Surgeon: Florencio Cara BIRCH, MD;  Location: [facility] INVASIVE CV LAB;  Service: Cardiovascular;  Laterality: N/A;   LEFT HEART CATH AND CORONARY ANGIOGRAPHY N/A 09/09/2023   Procedure: LEFT HEART CATH AND CORONARY ANGIOGRAPHY;  Surgeon: Florencio Cara BIRCH, MD;  Location: [facility] INVASIVE CV LAB;  Service: Cardiovascular;  Laterality: N/A;   LEFT HEART CATH AND CORONARY ANGIOGRAPHY N/A 09/11/2023   Procedure: LEFT HEART CATH AND CORONARY ANGIOGRAPHY;  Surgeon: Florencio Cara BIRCH, MD;  Location: [facility] INVASIVE CV LAB;  Service: Cardiovascular;  Laterality: N/A;   NASAL SINUS SURGERY  2023   SINUS ENDO W/FUSION Left 03/11/2024   Procedure: SINUS SURGERY, ENDOSCOPIC, USING COMPUTER-ASSISTED NAVIGATION;  Surgeon: Karis Clunes, MD;  Location: [facility] OR;  Service: ENT;  Laterality: Left;    Social History   Socioeconomic History   Marital status: Married    Spouse name: Not on file   Number of children: Not on file   Years of education: Not on file   Highest education level: Not on file   Occupational History   Not on file  Tobacco Use   Smoking status: Never   Smokeless tobacco: Never  Vaping Use   Vaping status: Never Used  Substance and Sexual Activity   Alcohol use: Not Currently   Drug use: No   Sexual activity: Not on file  Other Topics Concern   Not on file  Social History Narrative   Not on file   Social Drivers of Health   Financial Resource Strain: Not on file  Food Insecurity: No Food Insecurity (09/09/2023)   Hunger Vital Sign    Worried About Running Out of Food in the Last Year: Never true    Ran Out of Food in the Last Year: Never true  Transportation Needs: No Transportation Needs (09/09/2023)   PRAPARE - Administrator, Civil Service (Medical): No    Lack of Transportation (Non-Medical): No  Physical Activity: Not on file  Stress: Not on file  Social Connections: Not on file  Intimate Partner Violence: Not At Risk (09/09/2023)   Humiliation, Afraid, Rape, and Kick questionnaire    Fear of Current or Ex-Partner: No    Emotionally Abused: No    Physically Abused: No    Sexually Abused: No    Family History  Problem Relation Age of Onset   Kidney cancer Neg Hx    Prostate cancer Neg Hx     Allergies  Allergen Reactions   Quinine Derivatives Itching    Outpatient Medications Prior to  Visit  Medication Sig   acetaminophen  (TYLENOL ) 325 MG tablet Take 2 tablets (650 mg total) by mouth every 6 (six) hours as needed for mild pain (pain score 1-3) (or Fever >/= 101).   albuterol (VENTOLIN HFA) 108 (90 Base) MCG/ACT inhaler Inhale 2 puffs into the lungs every 6 (six) hours as needed for wheezing or shortness of breath.   cyanocobalamin (VITAMIN B12) 1000 MCG tablet Take 1,000 mcg by mouth daily.   finasteride  (PROSCAR ) 5 MG tablet Take 1 tablet (5 mg total) by mouth daily.   fluticasone  (FLONASE) 50 MCG/ACT nasal spray SPRAY 2 SPRAYS INTO EACH NOSTRIL EVERY DAY   fluticasone  furoate-vilanterol (BREO ELLIPTA ) 100-25 MCG/ACT  AEPB Inhale 1 puff into the lungs daily as needed (allergies).   gabapentin (NEURONTIN) 300 MG capsule Take 300 mg by mouth at bedtime.   magnesium  oxide (MAG-OX) 400 (240 Mg) MG tablet Take 400 mg by mouth daily.   metFORMIN  (GLUCOPHAGE ) 850 MG tablet TAKE 1 TABLET (850 MG TOTAL) BY MOUTH 2 (TWO) TIMES DAILY WITH A MEAL.   metoprolol  tartrate (LOPRESSOR ) 50 MG tablet Take 1 tablet (50 mg total) by mouth 2 (two) times daily.   Naphazoline-Pheniramine (OPCON-A) 0.027-0.315 % SOLN Place 1 drop into both eyes as needed (red eyes).   rosuvastatin  (CRESTOR ) 40 MG tablet Take 1 tablet (40 mg total) by mouth daily.   tamsulosin  (FLOMAX ) 0.4 MG CAPS capsule Take 1 capsule (0.4 mg total) by mouth daily.   No facility-administered medications prior to visit.    Review of Systems  Constitutional:  Negative for weight loss (Gained 6 lbs).  HENT: Negative.    Eyes: Negative.   Respiratory: Negative.    Cardiovascular: Negative.   Gastrointestinal: Negative.   Genitourinary: Negative.   Musculoskeletal:  Positive for joint pain.  Skin: Negative.   Neurological: Negative.   Endo/Heme/Allergies: Negative.        Objective:   BP 116/83   Pulse 83   Temp (!) 97.2 F (36.2 C)   Ht 5' 6 (1.676 m)   Wt 170 lb 12.8 oz (77.5 kg)   SpO2 98%   BMI 27.57 kg/m   Vitals:   04/24/24 0836  BP: 116/83  Pulse: 83  Temp: (!) 97.2 F (36.2 C)  Height: 5' 6 (1.676 m)  Weight: 170 lb 12.8 oz (77.5 kg)  SpO2: 98%  BMI (Calculated): 27.58    Physical Exam Vitals reviewed.  Constitutional:      Appearance: Normal appearance.  HENT:     Head: Normocephalic.     Left Ear: There is no impacted cerumen.     Nose: Nose normal.     Mouth/Throat:     Mouth: Mucous membranes are moist.     Pharynx: No posterior oropharyngeal erythema.  Eyes:     Extraocular Movements: Extraocular movements intact.     Pupils: Pupils are equal, round, and reactive to light.  Cardiovascular:     Rate and Rhythm:  Regular rhythm.     Chest Wall: PMI is not displaced.     Pulses: Normal pulses.     Heart sounds: Normal heart sounds. No murmur heard. Pulmonary:     Effort: Pulmonary effort is normal.     Breath sounds: Normal air entry. No rhonchi or rales.  Abdominal:     General: Abdomen is flat. Bowel sounds are normal. There is no distension.     Palpations: Abdomen is soft. There is no hepatomegaly, splenomegaly or mass.  Tenderness: There is no abdominal tenderness.  Musculoskeletal:        General: Normal range of motion.     Cervical back: Normal range of motion and neck supple.     Right hip: Normal. Normal range of motion.     Right lower leg: No edema.     Left lower leg: No edema.     Comments: Tenderness left groin, pain exacerbated by flexion and internal rotation of thigh  Skin:    General: Skin is warm and dry.  Neurological:     General: No focal deficit present.     Mental Status: He is alert and oriented to person, place, and time.     Cranial Nerves: No cranial nerve deficit.     Motor: No weakness.  Psychiatric:        Mood and Affect: Mood normal.        Behavior: Behavior normal.      Results for orders placed or performed in visit on 04/24/24  POCT CBG (Fasting - Glucose)  Result Value Ref Range   Glucose Fasting, POC 146 (A) 70 - 99 mg/dL  POC CREATINE & ALBUMIN,URINE  Result Value Ref Range   Microalbumin Ur, POC 10 mg/L   Creatinine, POC 50 mg/dL   Albumin/Creatinine Ratio, Urine, POC <30     Recent Results (from the past 2160 hours)  Glucose, capillary     Status: None   Collection Time: 03/11/24  8:24 AM  Result Value Ref Range   Glucose-Capillary 91 70 - 99 mg/dL    Comment: Glucose reference range applies only to samples taken after fasting for at least 8 hours.   Comment 1 Notify RN    Comment 2 Document in Chart   Basic metabolic panel per protocol     Status: None   Collection Time: 03/11/24  8:50 AM  Result Value Ref Range   Sodium 139  135 - 145 mmol/L   Potassium 3.8 3.5 - 5.1 mmol/L   Chloride 105 98 - 111 mmol/L   CO2 26 22 - 32 mmol/L   Glucose, Bld 89 70 - 99 mg/dL    Comment: Glucose reference range applies only to samples taken after fasting for at least 8 hours.   BUN 11 6 - 20 mg/dL   Creatinine, Ser 9.05 0.61 - 1.24 mg/dL   Calcium  9.2 8.9 - 10.3 mg/dL   GFR, Estimated >39 >39 mL/min    Comment: (NOTE) Calculated using the CKD-EPI Creatinine Equation (2021)    Anion gap 8 5 - 15    Comment: Performed at [facility], [address] [place], [place] [address]  CBC per protocol     Status: None   Collection Time: 03/11/24  8:50 AM  Result Value Ref Range   WBC 8.6 4.0 - 10.5 K/uL   RBC 5.10 4.22 - 5.81 MIL/uL   Hemoglobin 13.6 13.0 - 17.0 g/dL   HCT 58.3 60.9 - 47.9 %   MCV 81.6 80.0 - 100.0 fL   MCH 26.7 26.0 - 34.0 pg   MCHC 32.7 30.0 - 36.0 g/dL   RDW 87.0 88.4 - 84.4 %   Platelets 181 150 - 400 K/uL   nRBC 0.0 0.0 - 0.2 %    Comment: Performed at [facility], [address] [place], [place] [address]  Surgical pathology     Status: None   Collection Time: 03/11/24 10:57 AM  Result Value Ref Range   SURGICAL  PATHOLOGY      SURGICAL PATHOLOGY CASE: [id number] PATIENT: RUDOLM CAVE Surgical Pathology Report     Clinical History: paranasal sinus mucocele, chronic frontal sinusitis (cm)     FINAL MICROSCOPIC DIAGNOSIS:  A. SINUS CONTENTS, LEFT: -  Fragments of predominantly denuded respiratory epithelium with extensive chronic inflammation consistent with chronic sinusitis (eosinophils are not a major component of the inflammation) in the background of blood and mucus. -  Separate fragments of unremarkable bony fragments.    GROSS DESCRIPTION:  A. Received fresh and subsequently placed in formalin labeled with the patient'Juliane Guest name and Left sinus contents is a 3.2 x 2.7 x 1.1 cm aggregate of red-tan, shaggy soft tissue fragments and tan cartilaginous material. No  polypoid structures are grossly identified. A representative portion of tissue is submitted in a single cassette.  (LEF 03/11/2024)  Final Diagnosis performed by Mark LeGolvan DO.   Electronically signed 6/17/202 5 Technical component performed at [facility] [employer] [facility], [address], [place], [place] [address].  Professional component performed at [facility], [address] [place], [place] [address].  Immunohistochemistry Technical component (if applicable) was performed at [facility]. [address], [place], [place] [address].   IMMUNOHISTOCHEMISTRY DISCLAIMER (if applicable): Some of these immunohistochemical stains may have been developed and the performance characteristics determine by [facility]. Some may not have been cleared or approved by the [employer]. Food and Drug Administration. The FDA has determined that such clearance or approval is not necessary. This test is used for clinical purposes. It should not be regarded as investigational or for research. This laboratory is certified under the Clinical Laboratory Improvement Amendments of 1988 (CLIA-88) as qualified to perform high complexity clinical  laboratory testing.  The controls stained appropriately.   IHC stains are performed on formalin fixed, paraffin embedded tissue using a 3,3diaminobenzidine (DAB) chromogen and Leica Bond Autostainer System. The staining intensity of the nucleus is score manually and is reported as the percentage of tumor cell nuclei demonstrating specific nuclear staining. The specimens are fixed in 10% Neutral Formalin for at least 6 hours and up to 72hrs. These tests are validated on decalcified tissue. Results should be interpreted with caution given the possibility of false negative results on decalcified specimens. Antibody Clones are as follows ER-clone 70F, PR-clone 16, Ki67- clone MM1. Some of  these immunohistochemical stains may have been developed and the performance characteristics determined by [facility].   Glucose, capillary     Status: Abnormal   Collection Time: 03/11/24 12:01 PM  Result Value Ref Range   Glucose-Capillary 129 (H) 70 - 99 mg/dL    Comment: Glucose reference range applies only to samples taken after fasting for at least 8 hours.  Lipid panel     Status: Abnormal   Collection Time: 04/22/24  9:33 AM  Result Value Ref Range   Cholesterol, Total 108 100 - 199 mg/dL   Triglycerides 75 0 - 149 mg/dL   HDL 39 (L) >60 mg/dL   VLDL Cholesterol Cal 16 5 - 40 mg/dL   LDL Chol Calc (NIH) 53 0 - 99 mg/dL   Chol/HDL Ratio 2.8 0.0 - 5.0 ratio    Comment:                                   T. Chol/HDL Ratio  Men  Women                               1/2 Avg.Risk  3.4    3.3                                   Avg.Risk  5.0    4.4                                2X Avg.Risk  9.6    7.1                                3X Avg.Risk 23.4   11.0   Hemoglobin A1c     Status: Abnormal   Collection Time: 04/22/24  9:33 AM  Result Value Ref Range   Hgb A1c MFr Bld 5.7 (H) 4.8 - 5.6 %    Comment:          Prediabetes: 5.7 - 6.4          Diabetes: >6.4          Glycemic control for adults with diabetes: <7.0    Est. average glucose Bld gHb Est-mCnc 117 mg/dL  POCT CBG (Fasting - Glucose)     Status: Abnormal   Collection Time: 04/24/24  8:43 AM  Result Value Ref Range   Glucose Fasting, POC 146 (A) 70 - 99 mg/dL  POC CREATINE & ALBUMIN,URINE     Status: Normal   Collection Time: 04/24/24  9:17 AM  Result Value Ref Range   Microalbumin Ur, POC 10 mg/L   Creatinine, POC 50 mg/dL   Albumin/Creatinine Ratio, Urine, POC <30       Assessment & Plan:  As per problem list. Problem List Items Addressed This Visit       Cardiovascular and Mediastinum   Essential (primary) hypertension   Relevant Orders   Comprehensive  metabolic panel with GFR     Endocrine   Controlled type 2 diabetes mellitus without complication, without long-term current use of insulin  (HCC) - Primary   Relevant Orders   POCT CBG (Fasting - Glucose) (Completed)   POC CREATINE & ALBUMIN,URINE (Completed)     Other   Pure hypercholesterolemia    Return in about 3 months (around 07/25/2024) for fu with labs prior.   Total time spent: 20 minutes  Sherrill Cinderella Perry, MD  04/24/2024   This document may have been prepared by [facility] Voice Recognition software and as such may include unintentional dictation errors.

## 2024-05-06 ENCOUNTER — Encounter (INDEPENDENT_AMBULATORY_CARE_PROVIDER_SITE_OTHER): Payer: Self-pay | Admitting: Otolaryngology

## 2024-05-06 ENCOUNTER — Ambulatory Visit (INDEPENDENT_AMBULATORY_CARE_PROVIDER_SITE_OTHER): Admitting: Otolaryngology

## 2024-05-06 VITALS — BP 130/82 | HR 74

## 2024-05-06 DIAGNOSIS — J321 Chronic frontal sinusitis: Secondary | ICD-10-CM

## 2024-05-06 DIAGNOSIS — J322 Chronic ethmoidal sinusitis: Secondary | ICD-10-CM | POA: Diagnosis not present

## 2024-05-06 MED ORDER — PREDNISONE 10 MG (48) PO TBPK: ORAL_TABLET | ORAL | 0 refills | Status: AC

## 2024-05-06 NOTE — Progress Notes (Signed)
 Patient ID: Brent Oneill, male   DOB: [birth year], 53 y.o.   MRN: [id number]  Procedure: Left nasal/sinus debridement status post endoscopic sinus surgery.   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  The mucocele was compressing on his left orbit, causing left eye proptosis and blurry vision.  The patient returns today reporting improvement of his left eye vision.  The left eye pressure has resolved.  He still has occasional left nasal crusting and congestion.  He had no significant bleeding from the left nasal cavity.  Currently the patient denies any facial pain, fever, or visual change.   Anesthesia: Topical Xylocaine  and Neo-Synephrine.   Description: The patient is placed upright in the exam chair.  The left nasal cavity is sprayed with topical Xylocaine  and Neo-Synephrine.  A 0 rigid endoscope is used for the examination. The scope is advanced past the left nostril into the right nasal cavity. A small amount of crusting is noted within the left nasal cavity and the ethmoid cavities.  The crusting is removed with suction catheters and alligator forceps, which are inserted in parallel with the rigid endoscope.  After the debridement procedure, the ethmoid cavities are noted to be widely patent. The left frontal recess is also open.  The patient tolerated the procedure well.   Follow up care: The patient is instructed to continue as needed nasal saline irrigation.  The patient will return for re-evaluation in 6 months.

## 2024-05-18 ENCOUNTER — Other Ambulatory Visit: Payer: Self-pay | Admitting: Internal Medicine

## 2024-05-27 ENCOUNTER — Other Ambulatory Visit: Payer: Self-pay | Admitting: Internal Medicine

## 2024-05-27 DIAGNOSIS — E119 Type 2 diabetes mellitus without complications: Secondary | ICD-10-CM

## 2024-05-29 ENCOUNTER — Encounter: Payer: Self-pay | Admitting: Urology

## 2024-07-31 ENCOUNTER — Ambulatory Visit: Admitting: Internal Medicine

## 2024-08-20 ENCOUNTER — Other Ambulatory Visit: Payer: Self-pay | Admitting: Internal Medicine

## 2024-08-20 ENCOUNTER — Telehealth (INDEPENDENT_AMBULATORY_CARE_PROVIDER_SITE_OTHER): Payer: Self-pay | Admitting: Otolaryngology

## 2024-08-20 ENCOUNTER — Other Ambulatory Visit (INDEPENDENT_AMBULATORY_CARE_PROVIDER_SITE_OTHER): Payer: Self-pay | Admitting: Otolaryngology

## 2024-08-20 DIAGNOSIS — E119 Type 2 diabetes mellitus without complications: Secondary | ICD-10-CM

## 2024-08-20 MED ORDER — PREDNISONE 10 MG (48) PO TBPK: ORAL_TABLET | ORAL | 0 refills | Status: AC

## 2024-08-20 NOTE — Telephone Encounter (Signed)
 The patient called in requesting a refill of the prednisone  10 mg, just as it was done last time. He reports that his face around his left eye is starting to swell again. Please send to [facility] [place].  Please advise patient. Thanks!

## 2024-08-26 ENCOUNTER — Telehealth (INDEPENDENT_AMBULATORY_CARE_PROVIDER_SITE_OTHER): Payer: Self-pay | Admitting: Otolaryngology

## 2024-08-26 ENCOUNTER — Other Ambulatory Visit

## 2024-08-26 DIAGNOSIS — E119 Type 2 diabetes mellitus without complications: Secondary | ICD-10-CM

## 2024-08-26 NOTE — Telephone Encounter (Signed)
 Prescription nasal rinse was faxed to [facility] with 5 refills.

## 2024-08-27 LAB — HEMOGLOBIN A1C
Est. average glucose Bld gHb Est-mCnc: 123 mg/dL
Hgb A1c MFr Bld: 5.9 % — ABNORMAL HIGH (ref 4.8–5.6)

## 2024-08-27 LAB — COMPREHENSIVE METABOLIC PANEL WITH GFR
ALT: 17 IU/L (ref 0–44)
AST: 16 IU/L (ref 0–40)
Albumin: 4.3 g/dL (ref 3.8–4.9)
Alkaline Phosphatase: 53 IU/L (ref 47–123)
BUN/Creatinine Ratio: 10 (ref 9–20)
BUN: 11 mg/dL (ref 6–24)
Bilirubin Total: 0.4 mg/dL (ref 0.0–1.2)
CO2: 26 mmol/L (ref 20–29)
Calcium: 9.8 mg/dL (ref 8.7–10.2)
Chloride: 100 mmol/L (ref 96–106)
Creatinine, Ser: 1.14 mg/dL (ref 0.76–1.27)
Globulin, Total: 2 g/dL (ref 1.5–4.5)
Glucose: 93 mg/dL (ref 70–99)
Potassium: 4.6 mmol/L (ref 3.5–5.2)
Sodium: 139 mmol/L (ref 134–144)
Total Protein: 6.3 g/dL (ref 6.0–8.5)
eGFR: 77 mL/min/1.73 (ref >59)

## 2024-08-27 LAB — LIPID PANEL
Chol/HDL Ratio: 2.3 ratio (ref 0.0–5.0)
Cholesterol, Total: 128 mg/dL (ref 100–199)
HDL: 56 mg/dL (ref >39)
LDL Chol Calc (NIH): 54 mg/dL (ref 0–99)
Triglycerides: 96 mg/dL (ref 0–149)
VLDL Cholesterol Cal: 18 mg/dL (ref 5–40)

## 2024-08-28 ENCOUNTER — Ambulatory Visit: Payer: Self-pay | Admitting: Internal Medicine

## 2024-08-28 ENCOUNTER — Encounter: Payer: Self-pay | Admitting: Internal Medicine

## 2024-08-28 ENCOUNTER — Ambulatory Visit: Admitting: Internal Medicine

## 2024-08-28 VITALS — BP 130/86 | HR 64 | Temp 97.3°F | Ht 66.0 in | Wt 172.8 lb

## 2024-08-28 DIAGNOSIS — E7800 Pure hypercholesterolemia, unspecified: Secondary | ICD-10-CM | POA: Diagnosis not present

## 2024-08-28 DIAGNOSIS — I252 Old myocardial infarction: Secondary | ICD-10-CM | POA: Diagnosis not present

## 2024-08-28 DIAGNOSIS — E119 Type 2 diabetes mellitus without complications: Secondary | ICD-10-CM

## 2024-08-28 LAB — POCT CBG (FASTING - GLUCOSE)-MANUAL ENTRY: Glucose Fasting, POC: 97 mg/dL (ref 70–99)

## 2024-08-28 NOTE — Progress Notes (Signed)
 Established Patient Office Visit  Subjective:  Patient ID: Brent Oneill, male    DOB: 1971/08/22  Age: 53 y.o. MRN: [id number]  Chief Complaint  Patient presents with   Follow-up    3 month follow up, discuss lab results.    No new complaints, here for lab review and medication refills. Labs reviewed and notable for well controlled diabetes, A1c slightly higher but at target, lipids at target with unremarkable cmp. Denies any hypoglycemic episodes and home bg readings have been at target.     No other concerns at this time.   Past Medical History:  Diagnosis Date   Allergic rhinitis 03/14/2016   Asthma, persistent 03/14/2016   Diabetes mellitus without complication (HCC)    Hypertension    Myocardial infarction [facility])     Past Surgical History:  Procedure Laterality Date   CORONARY ANGIOPLASTY     CORONARY/GRAFT ACUTE MI REVASCULARIZATION N/A 09/09/2023   Procedure: Coronary/Graft Acute MI Revascularization;  Surgeon: Florencio Cara BIRCH, MD;  Location: [facility] INVASIVE CV LAB;  Service: Cardiovascular;  Laterality: N/A;   LEFT HEART CATH AND CORONARY ANGIOGRAPHY N/A 09/09/2023   Procedure: LEFT HEART CATH AND CORONARY ANGIOGRAPHY;  Surgeon: Florencio Cara BIRCH, MD;  Location: [facility] INVASIVE CV LAB;  Service: Cardiovascular;  Laterality: N/A;   LEFT HEART CATH AND CORONARY ANGIOGRAPHY N/A 09/11/2023   Procedure: LEFT HEART CATH AND CORONARY ANGIOGRAPHY;  Surgeon: Florencio Cara BIRCH, MD;  Location: [facility] INVASIVE CV LAB;  Service: Cardiovascular;  Laterality: N/A;   NASAL SINUS SURGERY  2023   SINUS ENDO W/FUSION Left 03/11/2024   Procedure: SINUS SURGERY, ENDOSCOPIC, USING COMPUTER-ASSISTED NAVIGATION;  Surgeon: Karis Clunes, MD;  Location: [facility] OR;  Service: ENT;  Laterality: Left;    Social History   Socioeconomic History   Marital status: Married    Spouse name: Not on file   Number of children: Not on file   Years of education: Not on file   Highest education level: Not on file   Occupational History   Not on file  Tobacco Use   Smoking status: Never   Smokeless tobacco: Never  Vaping Use   Vaping status: Never Used  Substance and Sexual Activity   Alcohol use: Not Currently   Drug use: No   Sexual activity: Not on file  Other Topics Concern   Not on file  Social History Narrative   Not on file   Social Drivers of Health   Financial Resource Strain: Not on file  Food Insecurity: No Food Insecurity (09/09/2023)   Hunger Vital Sign    Worried About Running Out of Food in the Last Year: Never true    Ran Out of Food in the Last Year: Never true  Transportation Needs: No Transportation Needs (09/09/2023)   PRAPARE - Administrator, Civil Service (Medical): No    Lack of Transportation (Non-Medical): No  Physical Activity: Not on file  Stress: Not on file  Social Connections: Not on file  Intimate Partner Violence: Not At Risk (09/09/2023)   Humiliation, Afraid, Rape, and Kick questionnaire    Fear of Current or Ex-Partner: No    Emotionally Abused: No    Physically Abused: No    Sexually Abused: No    Family History  Problem Relation Age of Onset   Kidney cancer Neg Hx    Prostate cancer Neg Hx     Allergies  Allergen Reactions   Quinine Derivatives Itching    Outpatient Medications Prior to  Visit  Medication Sig   acetaminophen  (TYLENOL ) 325 MG tablet Take 2 tablets (650 mg total) by mouth every 6 (six) hours as needed for mild pain (pain score 1-3) (or Fever >/= 101).   albuterol (VENTOLIN HFA) 108 (90 Base) MCG/ACT inhaler Inhale 2 puffs into the lungs every 6 (six) hours as needed for wheezing or shortness of breath.   cyanocobalamin (VITAMIN B12) 1000 MCG tablet Take 1,000 mcg by mouth daily.   finasteride  (PROSCAR ) 5 MG tablet Take 1 tablet (5 mg total) by mouth daily.   fluticasone  (FLONASE) 50 MCG/ACT nasal spray SPRAY 2 SPRAYS INTO EACH NOSTRIL EVERY DAY   fluticasone  furoate-vilanterol (BREO ELLIPTA ) 100-25 MCG/ACT  AEPB Inhale 1 puff into the lungs daily as needed (allergies).   gabapentin (NEURONTIN) 300 MG capsule Take 300 mg by mouth at bedtime.   magnesium  oxide (MAG-OX) 400 (240 Mg) MG tablet Take 400 mg by mouth daily.   metFORMIN  (GLUCOPHAGE ) 850 MG tablet TAKE 1 TABLET (850 MG TOTAL) BY MOUTH 2 (TWO) TIMES DAILY WITH A MEAL.   metoprolol  tartrate (LOPRESSOR ) 50 MG tablet Take 1 tablet (50 mg total) by mouth 2 (two) times daily.   Naphazoline-Pheniramine (OPCON-A) 0.027-0.315 % SOLN Place 1 drop into both eyes as needed (red eyes).   rosuvastatin  (CRESTOR ) 40 MG tablet Take 1 tablet (40 mg total) by mouth daily.   tamsulosin  (FLOMAX ) 0.4 MG CAPS capsule Take 1 capsule (0.4 mg total) by mouth daily.   predniSONE  (STERAPRED UNI-PAK 48 TAB) 10 MG (48) TBPK tablet Per instructions (6,6,6,6,4,4,4,4,2,2,2,2) (Patient not taking: Reported on 08/28/2024)   predniSONE  (STERAPRED UNI-PAK 48 TAB) 10 MG (48) TBPK tablet Per instructions (6,6,6,6,4,4,4,4,2,2,2,2) (Patient not taking: Reported on 08/28/2024)   No facility-administered medications prior to visit.    Review of Systems  Constitutional:  Positive for weight loss (Gained 2 lbs).  HENT: Negative.    Eyes: Negative.   Respiratory: Negative.    Cardiovascular: Negative.   Gastrointestinal: Negative.   Genitourinary: Negative.   Musculoskeletal:  Positive for joint pain.  Skin: Negative.   Neurological: Negative.   Endo/Heme/Allergies: Negative.        Objective:   BP 130/86   Pulse 64   Temp (!) 97.3 F (36.3 C) (Tympanic)   Ht 5' 6 (1.676 m)   Wt 172 lb 12.8 oz (78.4 kg)   SpO2 97%   BMI 27.89 kg/m   Vitals:   08/28/24 1141  BP: 130/86  Pulse: 64  Temp: (!) 97.3 F (36.3 C)  Height: 5' 6 (1.676 m)  Weight: 172 lb 12.8 oz (78.4 kg)  SpO2: 97%  TempSrc: Tympanic  BMI (Calculated): 27.9    Physical Exam Vitals reviewed.  Constitutional:      Appearance: Normal appearance.  HENT:     Head: Normocephalic.     Left Ear:  There is no impacted cerumen.     Nose: Nose normal.     Mouth/Throat:     Mouth: Mucous membranes are moist.     Pharynx: No posterior oropharyngeal erythema.  Eyes:     Extraocular Movements: Extraocular movements intact.     Pupils: Pupils are equal, round, and reactive to light.  Cardiovascular:     Rate and Rhythm: Regular rhythm.     Chest Wall: PMI is not displaced.     Pulses: Normal pulses.     Heart sounds: Normal heart sounds. No murmur heard. Pulmonary:     Effort: Pulmonary effort is normal.  Breath sounds: Normal air entry. No rhonchi or rales.  Abdominal:     General: Abdomen is flat. Bowel sounds are normal. There is no distension.     Palpations: Abdomen is soft. There is no hepatomegaly, splenomegaly or mass.     Tenderness: There is no abdominal tenderness.  Musculoskeletal:        General: Normal range of motion.     Cervical back: Normal range of motion and neck supple.     Right hip: Normal. Normal range of motion.     Right lower leg: No edema.     Left lower leg: No edema.     Comments: Tenderness left groin, pain exacerbated by flexion and internal rotation of thigh  Skin:    General: Skin is warm and dry.  Neurological:     General: No focal deficit present.     Mental Status: He is alert and oriented to person, place, and time.     Cranial Nerves: No cranial nerve deficit.     Motor: No weakness.  Psychiatric:        Mood and Affect: Mood normal.        Behavior: Behavior normal.      Results for orders placed or performed in visit on 08/28/24  POCT CBG (Fasting - Glucose)  Result Value Ref Range   Glucose Fasting, POC 97 70 - 99 mg/dL    Recent Results (from the past 2160 hours)  Comprehensive metabolic panel with GFR     Status: None   Collection Time: 08/26/24  9:15 AM  Result Value Ref Range   Glucose 93 70 - 99 mg/dL   BUN 11 6 - 24 mg/dL   Creatinine, Ser 8.85 0.76 - 1.27 mg/dL   eGFR 77 >40 fO/fpw/8.26   BUN/Creatinine Ratio  10 9 - 20   Sodium 139 134 - 144 mmol/L   Potassium 4.6 3.5 - 5.2 mmol/L   Chloride 100 96 - 106 mmol/L   CO2 26 20 - 29 mmol/L   Calcium  9.8 8.7 - 10.2 mg/dL   Total Protein 6.3 6.0 - 8.5 g/dL   Albumin 4.3 3.8 - 4.9 g/dL   Globulin, Total 2.0 1.5 - 4.5 g/dL   Bilirubin Total 0.4 0.0 - 1.2 mg/dL   Alkaline Phosphatase 53 47 - 123 IU/L   AST 16 0 - 40 IU/L   ALT 17 0 - 44 IU/L  Lipid panel     Status: None   Collection Time: 08/26/24  9:15 AM  Result Value Ref Range   Cholesterol, Total 128 100 - 199 mg/dL   Triglycerides 96 0 - 149 mg/dL   HDL 56 >60 mg/dL   VLDL Cholesterol Cal 18 5 - 40 mg/dL   LDL Chol Calc (NIH) 54 0 - 99 mg/dL   Chol/HDL Ratio 2.3 0.0 - 5.0 ratio    Comment:                                   T. Chol/HDL Ratio                                             Men  Women  1/2 Avg.Risk  3.4    3.3                                   Avg.Risk  5.0    4.4                                2X Avg.Risk  9.6    7.1                                3X Avg.Risk 23.4   11.0   Hemoglobin A1c     Status: Abnormal   Collection Time: 08/26/24  9:16 AM  Result Value Ref Range   Hgb A1c MFr Bld 5.9 (H) 4.8 - 5.6 %    Comment:          Prediabetes: 5.7 - 6.4          Diabetes: >6.4          Glycemic control for adults with diabetes: <7.0    Est. average glucose Bld gHb Est-mCnc 123 mg/dL  POCT CBG (Fasting - Glucose)     Status: None   Collection Time: 08/28/24 11:46 AM  Result Value Ref Range   Glucose Fasting, POC 97 70 - 99 mg/dL      Assessment & Plan:  Elsie was seen today for follow-up.  Controlled type 2 diabetes mellitus without complication, without long-term current use of insulin  (HCC) -     POCT CBG (Fasting - Glucose) -     Lipid panel -     Hemoglobin A1c  History of MI (myocardial infarction)  Pure hypercholesterolemia   The current medical regimen is effective;  continue present plan and medications.  Problem List Items  Addressed This Visit       Endocrine   Controlled type 2 diabetes mellitus without complication, without long-term current use of insulin  (HCC) - Primary   Relevant Orders   POCT CBG (Fasting - Glucose) (Completed)     Other   Pure hypercholesterolemia   History of MI (myocardial infarction)    Return in about 3 months (around 11/26/2024) for fu with labs prior.   Total time spent: 20 minutes. This time includes review of previous notes and results and patient face to face interaction during today'Nyzir Dubois visit.    Sherrill Cinderella Perry, MD  08/28/2024   This document may have been prepared by [facility] Voice Recognition software and as such may include unintentional dictation errors.

## 2024-11-08 ENCOUNTER — Ambulatory Visit (INDEPENDENT_AMBULATORY_CARE_PROVIDER_SITE_OTHER): Admitting: Otolaryngology

## 2024-11-26 ENCOUNTER — Ambulatory Visit: Admitting: Internal Medicine

## 2024-12-19 ENCOUNTER — Other Ambulatory Visit

## 2025-01-01 ENCOUNTER — Ambulatory Visit: Admitting: Urology

## 2025-01-02 ENCOUNTER — Ambulatory Visit: Admitting: Urology
